# Patient Record
Sex: Male | Born: 1965
Health system: Southern US, Community
[De-identification: ages and names within clinical notes are randomized; demographics above are authoritative.]

## PROBLEM LIST (undated history)

## (undated) DIAGNOSIS — D496 Neoplasm of unspecified behavior of brain: Secondary | ICD-10-CM

## (undated) DIAGNOSIS — I10 Essential (primary) hypertension: Secondary | ICD-10-CM

## (undated) DIAGNOSIS — I639 Cerebral infarction, unspecified: Secondary | ICD-10-CM

## (undated) DIAGNOSIS — E119 Type 2 diabetes mellitus without complications: Secondary | ICD-10-CM

## (undated) HISTORY — PX: CRANIOTOMY: SHX93

## (undated) HISTORY — PX: BRAIN SURGERY: SHX531

---

## 2012-09-15 DIAGNOSIS — E119 Type 2 diabetes mellitus without complications: Secondary | ICD-10-CM

## 2012-09-15 HISTORY — DX: Type 2 diabetes mellitus without complications: E11.9

## 2015-04-22 ENCOUNTER — Observation Stay (HOSPITAL_COMMUNITY): Payer: Medicaid Other

## 2015-04-22 ENCOUNTER — Encounter (HOSPITAL_BASED_OUTPATIENT_CLINIC_OR_DEPARTMENT_OTHER): Payer: Self-pay | Admitting: *Deleted

## 2015-04-22 ENCOUNTER — Inpatient Hospital Stay (HOSPITAL_BASED_OUTPATIENT_CLINIC_OR_DEPARTMENT_OTHER)
Admission: EM | Admit: 2015-04-22 | Discharge: 2015-04-26 | DRG: 065 | Disposition: A | Discharge status: Home Health | Payer: Medicaid Other | Attending: Internal Medicine | Admitting: Internal Medicine

## 2015-04-22 ENCOUNTER — Emergency Department (HOSPITAL_BASED_OUTPATIENT_CLINIC_OR_DEPARTMENT_OTHER): Payer: Medicaid Other

## 2015-04-22 DIAGNOSIS — Z6830 Body mass index (BMI) 30.0-30.9, adult: Secondary | ICD-10-CM

## 2015-04-22 DIAGNOSIS — IMO0002 Reserved for concepts with insufficient information to code with codable children: Secondary | ICD-10-CM | POA: Diagnosis present

## 2015-04-22 DIAGNOSIS — G8194 Hemiplegia, unspecified affecting left nondominant side: Secondary | ICD-10-CM | POA: Diagnosis present

## 2015-04-22 DIAGNOSIS — Z8673 Personal history of transient ischemic attack (TIA), and cerebral infarction without residual deficits: Secondary | ICD-10-CM | POA: Diagnosis present

## 2015-04-22 DIAGNOSIS — E1065 Type 1 diabetes mellitus with hyperglycemia: Secondary | ICD-10-CM

## 2015-04-22 DIAGNOSIS — E860 Dehydration: Secondary | ICD-10-CM | POA: Diagnosis present

## 2015-04-22 DIAGNOSIS — B964 Proteus (mirabilis) (morganii) as the cause of diseases classified elsewhere: Secondary | ICD-10-CM | POA: Diagnosis present

## 2015-04-22 DIAGNOSIS — G8190 Hemiplegia, unspecified affecting unspecified side: Secondary | ICD-10-CM

## 2015-04-22 DIAGNOSIS — N189 Chronic kidney disease, unspecified: Secondary | ICD-10-CM | POA: Diagnosis present

## 2015-04-22 DIAGNOSIS — I10 Essential (primary) hypertension: Secondary | ICD-10-CM | POA: Diagnosis present

## 2015-04-22 DIAGNOSIS — I63339 Cerebral infarction due to thrombosis of unspecified posterior cerebral artery: Secondary | ICD-10-CM

## 2015-04-22 DIAGNOSIS — G459 Transient cerebral ischemic attack, unspecified: Secondary | ICD-10-CM | POA: Diagnosis present

## 2015-04-22 DIAGNOSIS — E785 Hyperlipidemia, unspecified: Secondary | ICD-10-CM | POA: Diagnosis present

## 2015-04-22 DIAGNOSIS — E669 Obesity, unspecified: Secondary | ICD-10-CM | POA: Diagnosis present

## 2015-04-22 DIAGNOSIS — I69354 Hemiplegia and hemiparesis following cerebral infarction affecting left non-dominant side: Secondary | ICD-10-CM

## 2015-04-22 DIAGNOSIS — N390 Urinary tract infection, site not specified: Secondary | ICD-10-CM | POA: Diagnosis present

## 2015-04-22 DIAGNOSIS — I639 Cerebral infarction, unspecified: Principal | ICD-10-CM | POA: Diagnosis present

## 2015-04-22 DIAGNOSIS — N179 Acute kidney failure, unspecified: Secondary | ICD-10-CM

## 2015-04-22 DIAGNOSIS — M25512 Pain in left shoulder: Secondary | ICD-10-CM

## 2015-04-22 DIAGNOSIS — Z87898 Personal history of other specified conditions: Secondary | ICD-10-CM

## 2015-04-22 DIAGNOSIS — Z9114 Patient's other noncompliance with medication regimen: Secondary | ICD-10-CM | POA: Diagnosis present

## 2015-04-22 DIAGNOSIS — R471 Dysarthria and anarthria: Secondary | ICD-10-CM | POA: Diagnosis present

## 2015-04-22 DIAGNOSIS — Z9889 Other specified postprocedural states: Secondary | ICD-10-CM

## 2015-04-22 DIAGNOSIS — I129 Hypertensive chronic kidney disease with stage 1 through stage 4 chronic kidney disease, or unspecified chronic kidney disease: Secondary | ICD-10-CM | POA: Diagnosis present

## 2015-04-22 DIAGNOSIS — E1165 Type 2 diabetes mellitus with hyperglycemia: Secondary | ICD-10-CM | POA: Diagnosis present

## 2015-04-22 DIAGNOSIS — E1122 Type 2 diabetes mellitus with diabetic chronic kidney disease: Secondary | ICD-10-CM | POA: Diagnosis present

## 2015-04-22 DIAGNOSIS — E1029 Type 1 diabetes mellitus with other diabetic kidney complication: Secondary | ICD-10-CM | POA: Diagnosis present

## 2015-04-22 DIAGNOSIS — I5032 Chronic diastolic (congestive) heart failure: Secondary | ICD-10-CM | POA: Diagnosis present

## 2015-04-22 DIAGNOSIS — R739 Hyperglycemia, unspecified: Secondary | ICD-10-CM | POA: Diagnosis present

## 2015-04-22 HISTORY — DX: Type 2 diabetes mellitus without complications: E11.9

## 2015-04-22 HISTORY — DX: Cerebral infarction, unspecified: I63.9

## 2015-04-22 HISTORY — DX: Essential (primary) hypertension: I10

## 2015-04-22 HISTORY — DX: Neoplasm of unspecified behavior of brain: D49.6

## 2015-04-22 LAB — CBC
HCT: 38.6 % — ABNORMAL LOW (ref 39.0–52.0)
Hemoglobin: 12.9 g/dL — ABNORMAL LOW (ref 13.0–17.0)
MCH: 26.8 pg (ref 26.0–34.0)
MCHC: 33.4 g/dL (ref 30.0–36.0)
MCV: 80.2 fL (ref 78.0–100.0)
PLATELETS: 229 10*3/uL (ref 150–400)
RBC: 4.81 MIL/uL (ref 4.22–5.81)
RDW: 14.2 % (ref 11.5–15.5)
WBC: 14.3 10*3/uL — ABNORMAL HIGH (ref 4.0–10.5)

## 2015-04-22 LAB — BASIC METABOLIC PANEL
ANION GAP: 13 (ref 5–15)
BUN: 17 mg/dL (ref 6–20)
CO2: 24 mmol/L (ref 22–32)
Calcium: 9.3 mg/dL (ref 8.9–10.3)
Chloride: 97 mmol/L — ABNORMAL LOW (ref 101–111)
Creatinine, Ser: 1.64 mg/dL — ABNORMAL HIGH (ref 0.61–1.24)
GFR, EST AFRICAN AMERICAN: 56 mL/min — AB (ref >60)
GFR, EST NON AFRICAN AMERICAN: 48 mL/min — AB (ref >60)
Glucose, Bld: 513 mg/dL — ABNORMAL HIGH (ref 65–99)
Potassium: 3.9 mmol/L (ref 3.5–5.1)
Sodium: 134 mmol/L — ABNORMAL LOW (ref 135–145)

## 2015-04-22 LAB — URINALYSIS, ROUTINE W REFLEX MICROSCOPIC
BILIRUBIN URINE: NEGATIVE
Glucose, UA: >1000 mg/dL — AB
KETONES UR: NEGATIVE mg/dL
Nitrite: NEGATIVE
PH: 5.5 (ref 5.0–8.0)
Protein, ur: 30 mg/dL — AB
Specific Gravity, Urine: 1.026 (ref 1.005–1.030)
Urobilinogen, UA: 1 mg/dL (ref 0.0–1.0)

## 2015-04-22 LAB — DIFFERENTIAL
Basophils Absolute: 0 10*3/uL (ref 0.0–0.1)
Basophils Relative: 0 % (ref 0–1)
Eosinophils Absolute: 0 10*3/uL (ref 0.0–0.7)
Eosinophils Relative: 0 % (ref 0–5)
LYMPHS PCT: 9 % — AB (ref 12–46)
Lymphs Abs: 1.2 10*3/uL (ref 0.7–4.0)
MONO ABS: 1 10*3/uL (ref 0.1–1.0)
Monocytes Relative: 7 % (ref 3–12)
NEUTROS ABS: 12.1 10*3/uL — AB (ref 1.7–7.7)
Neutrophils Relative %: 84 % — ABNORMAL HIGH (ref 43–77)

## 2015-04-22 LAB — APTT: aPTT: 28 seconds (ref 24–37)

## 2015-04-22 LAB — URINE MICROSCOPIC-ADD ON

## 2015-04-22 LAB — GLUCOSE, CAPILLARY: Glucose-Capillary: 277 mg/dL — ABNORMAL HIGH (ref 65–99)

## 2015-04-22 LAB — RAPID URINE DRUG SCREEN, HOSP PERFORMED
AMPHETAMINES: NOT DETECTED
Barbiturates: NOT DETECTED
Benzodiazepines: NOT DETECTED
Cocaine: NOT DETECTED
Opiates: NOT DETECTED
TETRAHYDROCANNABINOL: NOT DETECTED

## 2015-04-22 LAB — PROTIME-INR
INR: 1.14 (ref 0.00–1.49)
PROTHROMBIN TIME: 14.8 s (ref 11.6–15.2)

## 2015-04-22 LAB — ETHANOL: Alcohol, Ethyl (B): <5 mg/dL (ref <5)

## 2015-04-22 LAB — CBG MONITORING, ED
GLUCOSE-CAPILLARY: 477 mg/dL — AB (ref 65–99)
GLUCOSE-CAPILLARY: 338 mg/dL — AB (ref 65–99)

## 2015-04-22 LAB — TROPONIN I: Troponin I: <0.03 ng/mL (ref <0.031)

## 2015-04-22 MED ORDER — SENNOSIDES-DOCUSATE SODIUM 8.6-50 MG PO TABS
1.0000 | ORAL_TABLET | Freq: Every evening | ORAL | Status: DC | PRN
  Administered 2015-04-25: 1 via ORAL
  Filled 2015-04-22: qty 1

## 2015-04-22 MED ORDER — INSULIN ASPART 100 UNIT/ML ~~LOC~~ SOLN
0.0000 [IU] | Freq: Every day | SUBCUTANEOUS | Status: DC
  Administered 2015-04-22: 3 [IU] via SUBCUTANEOUS

## 2015-04-22 MED ORDER — ASPIRIN 325 MG PO TABS
325.0000 mg | ORAL_TABLET | Freq: Every day | ORAL | Status: DC
  Administered 2015-04-23 (×2): 325 mg via ORAL
  Filled 2015-04-22 (×2): qty 1

## 2015-04-22 MED ORDER — SODIUM CHLORIDE 0.9 % IV BOLUS (SEPSIS)
1000.0000 mL | Freq: Once | INTRAVENOUS | Status: AC
  Administered 2015-04-22: 1000 mL via INTRAVENOUS

## 2015-04-22 MED ORDER — INSULIN ASPART 100 UNIT/ML ~~LOC~~ SOLN
0.0000 [IU] | Freq: Three times a day (TID) | SUBCUTANEOUS | Status: DC
  Administered 2015-04-23: 5 [IU] via SUBCUTANEOUS
  Administered 2015-04-23: 7 [IU] via SUBCUTANEOUS
  Administered 2015-04-23: 3 [IU] via SUBCUTANEOUS

## 2015-04-22 MED ORDER — ASPIRIN 81 MG PO CHEW
324.0000 mg | CHEWABLE_TABLET | Freq: Once | ORAL | Status: DC
  Filled 2015-04-22: qty 4

## 2015-04-22 MED ORDER — INSULIN ASPART 100 UNIT/ML IV SOLN
10.0000 [IU] | Freq: Once | INTRAVENOUS | Status: AC
  Administered 2015-04-22: 10 [IU] via INTRAVENOUS
  Filled 2015-04-22: qty 1

## 2015-04-22 MED ORDER — ENOXAPARIN SODIUM 40 MG/0.4ML ~~LOC~~ SOLN
40.0000 mg | Freq: Every day | SUBCUTANEOUS | Status: DC
  Administered 2015-04-23 – 2015-04-25 (×4): 40 mg via SUBCUTANEOUS
  Filled 2015-04-22 (×4): qty 0.4

## 2015-04-22 MED ORDER — DEXTROSE 5 % IV SOLN
1.0000 g | INTRAVENOUS | Status: DC
  Administered 2015-04-23 – 2015-04-24 (×2): 1 g via INTRAVENOUS
  Filled 2015-04-22 (×3): qty 10

## 2015-04-22 MED ORDER — ASPIRIN 81 MG PO CHEW
324.0000 mg | CHEWABLE_TABLET | Freq: Once | ORAL | Status: AC
  Administered 2015-04-22: 324 mg via ORAL
  Filled 2015-04-22: qty 4

## 2015-04-22 MED ORDER — ATORVASTATIN CALCIUM 10 MG PO TABS
10.0000 mg | ORAL_TABLET | Freq: Every day | ORAL | Status: DC
  Administered 2015-04-23 – 2015-04-25 (×4): 10 mg via ORAL
  Filled 2015-04-22 (×4): qty 1

## 2015-04-22 MED ORDER — SODIUM CHLORIDE 0.9 % IV SOLN
Freq: Once | INTRAVENOUS | Status: AC
  Administered 2015-04-22: 1000 mL via INTRAVENOUS

## 2015-04-22 MED ORDER — STROKE: EARLY STAGES OF RECOVERY BOOK
Freq: Once | Status: AC
  Administered 2015-04-23

## 2015-04-22 MED ORDER — CEFTRIAXONE SODIUM 1 G IJ SOLR
1.0000 g | Freq: Once | INTRAMUSCULAR | Status: AC
  Administered 2015-04-22: 1 g via INTRAVENOUS

## 2015-04-22 MED ORDER — CEFTRIAXONE SODIUM 1 G IJ SOLR
INTRAMUSCULAR | Status: AC
  Filled 2015-04-22: qty 10

## 2015-04-22 NOTE — H&P (Signed)
[facility] Admission History and Physical       Paul Hanson TIW:[id number] DOB: January 09, 1966 DOA: 04/22/2015  Referring physician: EDP PCP: No primary care provider on file.  Specialists:   Chief Complaint: Slurred Speech and Increased Left sided Weakness  HPI: Paul Hanson is a 49 y.o. 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  He denies having an headache or chest pain.  His sister noticed his speech and recommended that he go to the ED for evaluation.  He was evaluated at the [facility] ED and a CT scan of the head was performed and returned negative for acute findings .  He was found to have a UTI and hyperglycemia with an initial glucose level of 513.  A Urine Culture was sent, and he was placed on IV Rocephin,   He was transferred to [facility] for further evaluation.       Review of Systems: Constitutional: No Weight Loss, No Weight Gain, Night Sweats, Fevers, Chills, Dizziness, Light Headedness, Fatigue, or Generalized Weakness HEENT: No Headaches, Difficulty Swallowing,Tooth/Dental Problems,Sore Throat,  No Sneezing, Rhinitis, Ear Ache, Nasal Congestion, or Post Nasal Drip,  Cardio-vascular:  No Chest pain, Orthopnea, PND, Edema in Lower Extremities, Anasarca, Dizziness, Palpitations  Resp: No Dyspnea, No DOE, No Productive Cough, No Non-Productive Cough, No Hemoptysis, No Wheezing.    GI: No Heartburn, Indigestion, Abdominal Pain, Nausea, Vomiting, Diarrhea, Constipation, Hematemesis, Hematochezia, Melena, Change in Bowel Habits,  Loss of Appetite  GU: No Dysuria, No Change in Color of Urine, No Urgency or Urinary Frequency, No Flank pain.  Musculoskeletal: No Joint Pain or Swelling,  No Decreased Range of Motion, No Back Pain.  Neurologic: No Syncope, No Seizures, Muscle Weakness, Paresthesia, Vision Disturbance or Loss, No Diplopia, No Vertigo, No Difficulty Walking,  Skin: No Rash or Lesions. Psych: No Change in Mood or Affect, No Depression or Anxiety, No Memory loss, No Confusion, or Hallucinations   Past Medical History  Diagnosis Date  . Stroke   . Diabetes mellitus without complication   . Hypertension   . Brain tumor      Past Surgical History  Procedure Laterality Date  . Craniotomy        Prior to Admission medications   Not on File     No Known Allergies  Social History:  reports that he has never smoked. He does not have any smokeless tobacco history on file. He reports that he does not drink alcohol or use illicit drugs.    History reviewed. No pertinent family history.     Physical Exam:  GEN:  Pleasant Well Nourished and Well Developed  49 y.o. male examined and in no acute distress; cooperative with exam Filed Vitals:   04/22/15 1800 04/22/15 1930 04/22/15 2007 04/22/15 2053  BP: 116/69 120/65 114/70 124/74  Pulse: 93 84 81 93  Temp:    99.1 F (37.3 C)  TempSrc:    Oral  Resp: 20 21 24 20   Height:      Weight:      SpO2: 98% 100% 100% 100%   Blood pressure 124/74, pulse 93, temperature 99.1 F (37.3 C), temperature source Oral, resp. rate 20, height 5\' 8"  (1.727 m),  weight 90.719 kg (200 lb), SpO2 100 %. PSYCH: SHe is alert and oriented x4; does not appear anxious does not appear depressed; affect is normal HEENT: Normocephalic and Atraumatic, Mucous membranes pink; PERRLA; EOM intact; Fundi:  Benign;  No scleral icterus, Nares: Patent, Oropharynx: Clear,  Fair Dentition,    Neck:  FROM, No Cervical Lymphadenopathy nor Thyromegaly or Carotid Bruit; No JVD; Breasts:: Not examined CHEST WALL: No tenderness CHEST: Normal respiration, clear to auscultation bilaterally HEART: Regular rate and rhythm; no murmurs rubs or  gallops BACK: No kyphosis or scoliosis; No CVA tenderness ABDOMEN: Positive Bowel Sounds, Scaphoid, Obese, Soft Non-Tender, No Rebound or Guarding; No Masses, No Organomegaly, No Pannus; No Intertriginous candida. Rectal Exam: Not done EXTREMITIES: No Bone or Joint Deformity; Age-Appropriate Arthropathy of the Hands and knees; No Cyanosis, Clubbing, or Edema; No Ulcerations. Genitalia: not examined PULSES: 2+ and symmetric SKIN: Normal hydration no rash or ulceration  CNS:  Alert and Oriented x 4,   Mental Status:  Alert, Oriented, Thought Content Appropriate. Speech Fluent with evidence of Mild Dysarthria  ( Chronic). Able to follow 3 step commands without difficulty.  In No obvious pain.    Cranial Nerves:  II: Discs flat bilaterally; Visual fields Intact, Pupils equal and reactive.    III,IV, VI: Extra-ocular motions intact bilaterally    V,VII: smile symmetric, facial light touch sensation normal bilaterally    VIII: hearing intact bilaterally    IX,X: gag reflex present    XI: bilateral shoulder shrug    XII: midline tongue extension    Motor:  Right:  Upper extremity 5/5     Left:  Upper extremity 4/5     Right:  Lower extremity 5/5    Left:  Lower extremity 5-/5     Tone and Bulk:  normal tone throughout; no atrophy noted    Sensory:  Pinprick and light touch intact throughout, bilaterally    Deep Tendon Reflexes: 2+ and symmetric throughout    Plantars/ Babinski:  Right: normal Left: equivocal     Cerebellar:  Finger to nose with difficulty from Left Hand    Gait: deferred    Vascular: pulses palpable throughout    Labs on Admission:  Basic Metabolic Panel:  Recent Labs Lab 04/22/15 1640  NA 134*  K 3.9  CL 97*  CO2 24  GLUCOSE 513*  BUN 17  CREATININE 1.64*  CALCIUM 9.3   Liver Function Tests: No results for input(s): AST, ALT, ALKPHOS, BILITOT, PROT, ALBUMIN in the last 168 hours. No results for input(s): LIPASE, AMYLASE in the last 168 hours. No  results for input(s): AMMONIA in the last 168 hours. CBC:  Recent Labs Lab 04/22/15 1640  WBC 14.3*  NEUTROABS 12.1*  HGB 12.9*  HCT 38.6*  MCV 80.2  PLT 229   Cardiac Enzymes:  Recent Labs Lab 04/22/15 1640  TROPONINI <0.03    BNP (last 3 results) No results for input(s): BNP in the last 8760 hours.  ProBNP (last 3 results) No results for input(s): PROBNP in the last 8760 hours.  CBG:  Recent Labs Lab 04/22/15 1612 04/22/15 1812  GLUCAP 477* 338*    Radiological Exams on Admission: Ct Head Wo Contrast  04/22/2015   CLINICAL DATA:  Pt with left sided lower extremity weakness and unsteady gait, hx stroke, hx craniotomy for benign tumor 20+ yrs ago  EXAM: CT HEAD WITHOUT CONTRAST  TECHNIQUE: Contiguous axial images were obtained from the base of the skull through the vertex without  intravenous contrast.  COMPARISON:  None.  FINDINGS: The ventricles are normal in configuration. There is mild ventricular and sulcal enlargement reflecting volume loss mildly advanced for age. No hydrocephalus.  There are no parenchymal masses or mass effect. There is encephalomalacia in the left cerebellar hemisphere deep to a left craniectomy defect consistent with the history of resection of a benign tumor. There are calcifications along both cerebellar hemispheres, more prominent on the left. There is no evidence of a cortical infarct. Small old lacune infarct projects adjacent to the genu of the right internal capsule.  There are no extra-axial masses or abnormal fluid collections.  There is no intracranial hemorrhage.  There is expansion of the right posterior frontal and parietal bone with areas of lucency and sclerosis within the expanded diploic space. This has a chronic, benign appearance but is of unclear etiology. There is no upper pole in the right occipital bone superiorly.  Visualized sinuses and mastoid air cells are clear.  IMPRESSION: 1. No acute intracranial abnormalities. 2. Volume  loss advanced for age. 3. Status post left occipital craniectomy with underlying left cerebellar encephalomalacia. 4. Right posterior frontal/parietal bone expansion. This has a chronic/benign appearance. It could be from prior surgery. It is of unclear etiology, however.   Electronically Signed   By: Lajean Manes M.D.   On: 04/22/2015 17:10     EKG: Independently reviewed. Sinus tachycardia rate =107,  Nonspecific S-T changes   Assessment/Plan:   49 y.o. male with  Principal Problem:   1.    TIA (transient ischemic attack) vs CVA   TIA/CVA Workup   Cardiac Monitoring   Neuro Checks   MRI/MRA Brain ordered   Carotid US   2D ECHO   Check Fasting Lipids   Check HbA1C   ASA Rx   Added Atorvastatin 10 mg PO q day   Neurology Consulted- Dr Doy Mince      Active Problems:   2.    Hyperglycemia   SSI coverage PRN   Check HbA1C     3.    AKI (acute kidney injury)   IVFs   Monitor BUN/Cr      4.    UTI (urinary tract infection)   Urine C+S Sent   Empiric IV Rocephin        5.    Hypertension   Monitor BPs   PRN IV Hydralazine for SBP > 160       6.    History of brain tumor- S/P Craniotomy and Resection in 1991      7.    History of CVA (cerebrovascular accident) in 07/2013 with Chronic Left Sided Weakness      8.    H/O medication noncompliance- due to finances   Consult Case Management        9.    DVT Prophylaxis   Lovnox              Code Status:     FULL CODE     Family Communication:   Family at Bedside     Disposition Plan:    Observation Status        Time spent: 23 Minutes      Theressa Millard [facility] Pager [phone]   If [place] Please Contact the Day Rounding Team MD for [facility]  If 7PM-7AM, Please Contact Night-Floor Coverage  [email or web address] Password [email or web address] 04/22/2015, 10:27 PM     ADDENDUM:   Patient was seen and examined on 04/22/2015

## 2015-04-22 NOTE — ED Notes (Signed)
Pt states family said he had change in speech, alt LOC and weakness with onset approx 1430hrs today

## 2015-04-22 NOTE — ED Notes (Signed)
Placed on cont cardiac monitoring with cont POX

## 2015-04-22 NOTE — ED Notes (Signed)
MD at bedside. 

## 2015-04-22 NOTE — ED Notes (Signed)
12 lead EKG done and given to EDP for review

## 2015-04-22 NOTE — ED Notes (Signed)
Per PA okay for patient to drink.

## 2015-04-22 NOTE — ED Provider Notes (Signed)
CSN: [id number]     Arrival date & time 04/22/15  1608 History   First MD Initiated Contact with Patient 04/22/15 1612     Chief Complaint  Patient presents with  . Weakness     (Consider location/radiation/quality/duration/timing/severity/associated sxs/prior Treatment) HPI   Blood pressure 121/64, pulse 106, temperature 99 F (37.2 C), temperature source Oral, height 5\' 8"  (1.727 m), weight 200 lb (90.719 kg), SpO2 99 %.  Paul Hanson is a 49 y.o. male complaining of with past medical history significant for ischemic CVA in 2014 @ [facility], non-insulin-dependent diabetes, hypertension, brain tumor which was successfully removed in 1991. Patient is poor historian, the majority of the history supplied by his sister-in-law who is an Therapist, sports. She explains that the patient's sister was with the patient approximately 2:30 PM today, he was in the car and she had an episode of slurred speech states that he was "talking like a baby." She could not be understood this lasted approximately 5 minutes, he also had difficulty ambulating. There was a fall with no significant head trauma. He is not anticoagulated. Patient has history of prior CVA in 2014. He has a residual left-sided weakness. As per patient sister-in-law all symptoms are resolved, patient states that he is asymptomatic with no issues. He does not remember the incident.  Patient denies headache, change in vision, cervicalgia, chest pain, palpitations, weakness, shortness of breath, weakness above his baseline, difficulty ambulating. As per sister-in-law he lost his insurance and was noncompliant with all medications for 1 month, he recently started taking them 3-4 days ago.   Patient was following at [facility] however his insurance has lapsed and he's not been able to follow-up. Patient normally takes lisinopril hydrochlorothiazide, metformin, daily aspirin, and statin.   Past Medical History  Diagnosis Date  . Stroke   . Diabetes mellitus  without complication   . Hypertension   . Brain tumor    Past Surgical History  Procedure Laterality Date  . Craniotomy     History reviewed. No pertinent family history. History  Substance Use Topics  . Smoking status: Never Smoker   . Smokeless tobacco: Not on file  . Alcohol Use: No    Review of Systems  10 systems reviewed and found to be negative, except as noted in the HPI.  Allergies  Review of patient's allergies indicates no known allergies.  Home Medications   Prior to Admission medications   Not on File   BP 116/69 mmHg  Pulse 93  Temp(Src) 99 F (37.2 C) (Oral)  Resp 20  Ht 5\' 8"  (1.727 m)  Wt 200 lb (90.719 kg)  BMI 30.42 kg/m2  SpO2 98% Physical Exam  Constitutional: He is oriented to person, place, and time. He appears well-developed and well-nourished. No distress.  HENT:  Head: Normocephalic and atraumatic.  Mouth/Throat: Oropharynx is clear and moist.  Remote occipital craniotomy scar  Eyes: Conjunctivae and EOM are normal. Pupils are equal, round, and reactive to light.  Neck: Normal range of motion. No JVD present. No tracheal deviation present.  Cardiovascular: Normal rate, regular rhythm and intact distal pulses.   Mild tachycardia  Pulmonary/Chest: Effort normal and breath sounds normal. No stridor. No respiratory distress. He has no wheezes. He has no rales. He exhibits no tenderness.  Abdominal: Soft. He exhibits no distension and no mass. There is no tenderness. There is no rebound and no guarding.  Musculoskeletal: Normal range of motion. He exhibits no edema or tenderness.  Neurological: He is alert and oriented to person, place, and time.  Pupils equal round and reactive to light, there is no slurring, no facial asymmetry. Extraocular movement is intact, patient has 4 out of 5 strength on the left upper and lower extremities with normal sensation, no significant drift.  Skin: Skin is warm. He is not diaphoretic.  Psychiatric: He  has a normal mood and affect.  Nursing note and vitals reviewed.   ED Course  Procedures (including critical care time) Labs Review Labs Reviewed  BASIC METABOLIC PANEL - Abnormal; Notable for the following:    Sodium 134 (*)    Chloride 97 (*)    Glucose, Bld 513 (*)    Creatinine, Ser 1.64 (*)    GFR calc non Af Amer 48 (*)    GFR calc Af Amer 56 (*)    All other components within normal limits  CBC - Abnormal; Notable for the following:    WBC 14.3 (*)    Hemoglobin 12.9 (*)    HCT 38.6 (*)    All other components within normal limits  URINALYSIS, ROUTINE W REFLEX MICROSCOPIC (NOT AT [facility]) - Abnormal; Notable for the following:    APPearance CLOUDY (*)    Glucose, UA >1000 (*)    Hgb urine dipstick LARGE (*)    Protein, ur 30 (*)    Leukocytes, UA SMALL (*)    All other components within normal limits  DIFFERENTIAL - Abnormal; Notable for the following:    Neutrophils Relative % 84 (*)    Neutro Abs 12.1 (*)    Lymphocytes Relative 9 (*)    All other components within normal limits  URINE MICROSCOPIC-ADD ON - Abnormal; Notable for the following:    Bacteria, UA MANY (*)    All other components within normal limits  CBG MONITORING, ED - Abnormal; Notable for the following:    Glucose-Capillary 477 (*)    All other components within normal limits  CBG MONITORING, ED - Abnormal; Notable for the following:    Glucose-Capillary 338 (*)    All other components within normal limits  URINE CULTURE  ETHANOL  PROTIME-INR  APTT  URINE RAPID DRUG SCREEN, HOSP PERFORMED  TROPONIN I    Imaging Review Ct Head Wo Contrast  04/22/2015   CLINICAL DATA:  Pt with left sided lower extremity weakness and unsteady gait, hx stroke, hx craniotomy for benign tumor 20+ yrs ago  EXAM: CT HEAD WITHOUT CONTRAST  TECHNIQUE: Contiguous axial images were obtained from the base of the skull through the vertex without intravenous contrast.  COMPARISON:  None.  FINDINGS: The ventricles are normal  in configuration. There is mild ventricular and sulcal enlargement reflecting volume loss mildly advanced for age. No hydrocephalus.  There are no parenchymal masses or mass effect. There is encephalomalacia in the left cerebellar hemisphere deep to a left craniectomy defect consistent with the history of resection of a benign tumor. There are calcifications along both cerebellar hemispheres, more prominent on the left. There is no evidence of a cortical infarct. Small old lacune infarct projects adjacent to the genu of the right internal capsule.  There are no extra-axial masses or abnormal fluid collections.  There is no intracranial hemorrhage.  There is expansion of the right posterior frontal and parietal bone with areas of lucency and sclerosis within the expanded diploic space. This has a chronic, benign appearance but is of unclear etiology. There is no upper pole in the right occipital bone superiorly.  Visualized sinuses and mastoid air cells are clear.  IMPRESSION: 1. No acute intracranial abnormalities. 2. Volume loss advanced for age. 3. Status post left occipital craniectomy with underlying left cerebellar encephalomalacia. 4. Right posterior frontal/parietal bone expansion. This has a chronic/benign appearance. It could be from prior surgery. It is of unclear etiology, however.   Electronically Signed   By: Lajean Manes M.D.   On: 04/22/2015 17:10     EKG Interpretation   Date/Time:  Sunday April 22 2015 16:16:21 EDT Ventricular Rate:  107 PR Interval:  184 QRS Duration: 80 QT Interval:  306 QTC Calculation: 408 R Axis:   51 Text Interpretation:  Sinus tachycardia T wave abnormality, consider  anterior ischemia Abnormal ECG Confirmed by Alvino Chapel  MD, Ovid Curd [phone])  on 04/22/2015 4:18:54 PM      MDM   Final diagnoses:  AKI (acute kidney injury)  Hyperglycemia without ketosis  Transient cerebral ischemia, unspecified transient cerebral ischemia type  H/O medication  noncompliance  History of CVA (cerebrovascular accident)  Left hemiparesis  History of brain tumor    Filed Vitals:   04/22/15 1611 04/22/15 1613 04/22/15 1730 04/22/15 1800  BP: 121/64 121/64 113/81 116/69  Pulse: 110 106 96 93  Temp:  99 F (37.2 C)    TempSrc:  Oral    Resp:   15 20  Height:  5\' 8"  (1.727 m)    Weight:  200 lb (90.719 kg)    SpO2: 98% 99% 100% 98%    Medications  cefTRIAXone (ROCEPHIN) 1 g in dextrose 5 % 50 mL IVPB (1 g Intravenous New Bag/Given 04/22/15 1825)  cefTRIAXone (ROCEPHIN) 1 G injection (not administered)  sodium chloride 0.9 % bolus 1,000 mL (1,000 mLs Intravenous New Bag/Given 04/22/15 1713)  insulin aspart (novoLOG) injection 10 Units (10 Units Intravenous Given 04/22/15 1713)  aspirin chewable tablet 324 mg (324 mg Oral Given 04/22/15 1728)  0.9 %  sodium chloride infusion (1,000 mLs Intravenous New Bag/Given 04/22/15 1826)    Paul Hanson is a pleasant 49 y.o. male presenting with resolved episodes of 5 minutes of garbled speech with weakness and fall. Patient is poor historian, history supplied by his sister in law Paul Hanson who is a Therapist, sports. Patient has his baseline left-sided hemiparesis and is at his baseline now. Discuss case with Dr. Alvino Chapel at intake, agrees with not calling code stroke. EKG shows sinus tachycardia.  Patient's blood glucose is found to be elevated at 477, however he has a normal anion gap. Patient will be bolused 1 L and given 10 units of regular insulin.   CT negative, will give full dose aspirin. Trying to obtain records from [facility], however, medical records is closed and we are unable to obtain these records. His renal function is elevated at 1.64, baseline unknown.  As per sister in law, no h/o renal issues.   Urinalysis is suggestive of UTI. Patient does endorse urinary frequency and dysuria, urine culture is ordered and patient is given a gram of Rocephin.  Blood sugar is responding well to hydration and  insulin.  Case discussed with [facility] hospital[facility] Dr. Candiss Norse who accepts admission to a telemetry bed.    Monico Blitz, PA-C 04/22/15 1828  Davonna Belling, MD 04/23/15 0010

## 2015-04-23 ENCOUNTER — Observation Stay (HOSPITAL_COMMUNITY): Payer: Medicaid Other

## 2015-04-23 DIAGNOSIS — N189 Chronic kidney disease, unspecified: Secondary | ICD-10-CM | POA: Diagnosis present

## 2015-04-23 DIAGNOSIS — I69354 Hemiplegia and hemiparesis following cerebral infarction affecting left non-dominant side: Secondary | ICD-10-CM | POA: Diagnosis not present

## 2015-04-23 DIAGNOSIS — G459 Transient cerebral ischemic attack, unspecified: Secondary | ICD-10-CM

## 2015-04-23 DIAGNOSIS — I5032 Chronic diastolic (congestive) heart failure: Secondary | ICD-10-CM | POA: Diagnosis present

## 2015-04-23 DIAGNOSIS — E1029 Type 1 diabetes mellitus with other diabetic kidney complication: Secondary | ICD-10-CM | POA: Diagnosis present

## 2015-04-23 DIAGNOSIS — E1065 Type 1 diabetes mellitus with hyperglycemia: Secondary | ICD-10-CM

## 2015-04-23 DIAGNOSIS — I129 Hypertensive chronic kidney disease with stage 1 through stage 4 chronic kidney disease, or unspecified chronic kidney disease: Secondary | ICD-10-CM | POA: Diagnosis present

## 2015-04-23 DIAGNOSIS — Z9889 Other specified postprocedural states: Secondary | ICD-10-CM | POA: Diagnosis not present

## 2015-04-23 DIAGNOSIS — B964 Proteus (mirabilis) (morganii) as the cause of diseases classified elsewhere: Secondary | ICD-10-CM | POA: Diagnosis present

## 2015-04-23 DIAGNOSIS — Z8673 Personal history of transient ischemic attack (TIA), and cerebral infarction without residual deficits: Secondary | ICD-10-CM | POA: Diagnosis present

## 2015-04-23 DIAGNOSIS — IMO0002 Reserved for concepts with insufficient information to code with codable children: Secondary | ICD-10-CM | POA: Diagnosis present

## 2015-04-23 DIAGNOSIS — I639 Cerebral infarction, unspecified: Secondary | ICD-10-CM | POA: Diagnosis present

## 2015-04-23 DIAGNOSIS — N179 Acute kidney failure, unspecified: Secondary | ICD-10-CM | POA: Diagnosis present

## 2015-04-23 DIAGNOSIS — G8194 Hemiplegia, unspecified affecting left nondominant side: Secondary | ICD-10-CM | POA: Diagnosis present

## 2015-04-23 DIAGNOSIS — E860 Dehydration: Secondary | ICD-10-CM | POA: Diagnosis present

## 2015-04-23 DIAGNOSIS — R471 Dysarthria and anarthria: Secondary | ICD-10-CM | POA: Diagnosis present

## 2015-04-23 DIAGNOSIS — Z6830 Body mass index (BMI) 30.0-30.9, adult: Secondary | ICD-10-CM | POA: Diagnosis not present

## 2015-04-23 DIAGNOSIS — E785 Hyperlipidemia, unspecified: Secondary | ICD-10-CM | POA: Diagnosis present

## 2015-04-23 DIAGNOSIS — I6330 Cerebral infarction due to thrombosis of unspecified cerebral artery: Secondary | ICD-10-CM

## 2015-04-23 DIAGNOSIS — E1122 Type 2 diabetes mellitus with diabetic chronic kidney disease: Secondary | ICD-10-CM | POA: Diagnosis present

## 2015-04-23 DIAGNOSIS — Z9114 Patient's other noncompliance with medication regimen: Secondary | ICD-10-CM | POA: Diagnosis present

## 2015-04-23 DIAGNOSIS — N390 Urinary tract infection, site not specified: Secondary | ICD-10-CM | POA: Diagnosis present

## 2015-04-23 DIAGNOSIS — E1165 Type 2 diabetes mellitus with hyperglycemia: Secondary | ICD-10-CM | POA: Diagnosis present

## 2015-04-23 DIAGNOSIS — E669 Obesity, unspecified: Secondary | ICD-10-CM | POA: Diagnosis present

## 2015-04-23 LAB — LIPID PANEL
Cholesterol: 135 mg/dL (ref 0–200)
HDL: 36 mg/dL — ABNORMAL LOW (ref >40)
LDL CALC: 82 mg/dL (ref 0–99)
Total CHOL/HDL Ratio: 3.8 RATIO
Triglycerides: 86 mg/dL (ref <150)
VLDL: 17 mg/dL (ref 0–40)

## 2015-04-23 LAB — GLUCOSE, CAPILLARY
GLUCOSE-CAPILLARY: 303 mg/dL — AB (ref 65–99)
GLUCOSE-CAPILLARY: 278 mg/dL — AB (ref 65–99)
GLUCOSE-CAPILLARY: 275 mg/dL — AB (ref 65–99)
Glucose-Capillary: 246 mg/dL — ABNORMAL HIGH (ref 65–99)

## 2015-04-23 MED ORDER — ACETAMINOPHEN 325 MG PO TABS
650.0000 mg | ORAL_TABLET | Freq: Four times a day (QID) | ORAL | Status: DC | PRN
  Administered 2015-04-23 – 2015-04-24 (×2): 650 mg via ORAL
  Filled 2015-04-23 (×2): qty 2

## 2015-04-23 MED ORDER — CLOPIDOGREL BISULFATE 75 MG PO TABS
75.0000 mg | ORAL_TABLET | Freq: Every day | ORAL | Status: DC
  Administered 2015-04-23 – 2015-04-26 (×4): 75 mg via ORAL
  Filled 2015-04-23 (×4): qty 1

## 2015-04-23 MED ORDER — INSULIN GLARGINE 100 UNIT/ML ~~LOC~~ SOLN
10.0000 [IU] | Freq: Every day | SUBCUTANEOUS | Status: DC
  Administered 2015-04-23: 10 [IU] via SUBCUTANEOUS
  Filled 2015-04-23 (×2): qty 0.1

## 2015-04-23 MED ORDER — SODIUM CHLORIDE 0.9 % IV BOLUS (SEPSIS)
500.0000 mL | Freq: Once | INTRAVENOUS | Status: AC
  Administered 2015-04-23: 500 mL via INTRAVENOUS

## 2015-04-23 MED ORDER — INSULIN ASPART 100 UNIT/ML ~~LOC~~ SOLN
0.0000 [IU] | Freq: Three times a day (TID) | SUBCUTANEOUS | Status: DC
  Administered 2015-04-24 (×2): 7 [IU] via SUBCUTANEOUS
  Administered 2015-04-24: 11 [IU] via SUBCUTANEOUS
  Administered 2015-04-25: 7 [IU] via SUBCUTANEOUS
  Administered 2015-04-25: 4 [IU] via SUBCUTANEOUS
  Administered 2015-04-25 – 2015-04-26 (×3): 7 [IU] via SUBCUTANEOUS

## 2015-04-23 MED ORDER — INSULIN ASPART 100 UNIT/ML ~~LOC~~ SOLN
0.0000 [IU] | Freq: Every day | SUBCUTANEOUS | Status: DC
  Administered 2015-04-23 – 2015-04-24 (×2): 3 [IU] via SUBCUTANEOUS
  Administered 2015-04-25: 2 [IU] via SUBCUTANEOUS

## 2015-04-23 NOTE — Progress Notes (Signed)
Inpatient Diabetes Program Recommendations  AACE/ADA: New Consensus Statement on Inpatient Glycemic Control (2013)  Target Ranges:  Prepandial:   less than 140 mg/dL      Peak postprandial:   less than 180 mg/dL (1-2 hours)      Critically ill patients:  140 - 180 mg/dL   Results for Paul Hanson, Paul Hanson (MRN [id number]) as of 04/23/2015 10:13  Ref. Range 04/22/2015 16:12 04/22/2015 18:12 04/22/2015 22:29 04/23/2015 07:34  Glucose-Capillary Latest Ref Range: 65-99 mg/dL 477 (H) 338 (H) 277 (H) 246 (H)    Diabetes history:DM2  Outpatient Diabetes medications: None Current orders for Inpatient glycemic control: Novolog 0-9 units TID with meals, Novolog 0-5 units HS  Inpatient Diabetes Program Recommendations Insulin - Basal: Please consider ordering Lantus 10 units Q24H starting now. Correction (SSI): Please consider increasing Novolog correction to moderate scale. HgbA1C: A1C has been ordered and is pending.  Thanks, Barnie Alderman, RN, MSN, CCRN, CDE Diabetes Coordinator Inpatient Diabetes Program [phone] (Team Pager from [place] to [place]) [phone] ([place] office) [phone] [facility] office) [phone] [facility] office)

## 2015-04-23 NOTE — Progress Notes (Signed)
PROGRESS NOTE  Paul Hanson ZHG:[id number] DOB: [birth year] DOA: 04/22/2015 PCP: No primary care provider on file.  HPI/Recap of past 58 hours: 49 year old male past mental history of previous CVA causing left hemiparesis, medication noncompliance and hypertension admitted on 8/7 evening with slurred speech and left-sided weakness which resolved. On admission, patient also noted a UTI and significant hyperglycemia with glucose of 513. Patient has been noncompliant with his medications. CT scan initially negative, MRI did note suspected acute CVA although location not related to patient's symptoms.   Today, patient feeling better. No further slurred speech or new weakness. Neurology consulted  Assessment/Plan: Principal Problem:   Acute CVA/TIA (transient ischemic attack): Suspected from small vessel disease. Continue medications, awaiting A1c plus PT and OT eval.  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 Euvolemic. We'll provide education. Already on ACE inhibitor. Will add very low dose beta blocker  Acute kidney injury in the setting of Uncontrolled diabetes mellitus with hyperglycemia and with secondary renal disease: Suspect some chronic kidney disease, likely as a function of patient's poorly controlled diabetes. Given hyperglycemia, may be worse now. Continue to hydrate and recheck in the morning. Have started Lantus plus more aggressive sliding scale   Code Status: Full code  Family Communication: Multiple family members the bedside  Disposition Plan: Potential discharge  tomorrow   Consultants:  Neurology  Procedures:  Echocardiogram done 8/8: Grade 1 diastolic dysfunction  Carotid Dopplers done 8/8: No signs of significant stenoses  Antibiotics:  None   Objective: BP 103/71 mmHg  Pulse 84  Temp(Src) 99.1 F (37.3 C) (Oral)  Resp 16  Ht 5\' 8"  (1.727 m)  Wt 90.719 kg (200 lb)  BMI 30.42 kg/m2  SpO2 95%  Intake/Output Summary (Last 24 hours) at 04/23/15 1750 Last data filed at 04/23/15 1607  Gross per 24 hour  Intake      0 ml  Output    875 ml  Net   -875 ml   Filed Weights   04/22/15 1613  Weight: 90.719 kg (200 lb)    Exam:   General:  Alert and oriented 3, no acute distress  Cardiovascular: Regular rate and rhythm, S1-S2  Respiratory: Clear to auscultation bilaterally  Abdomen: Soft, nontender, nondistended, positive bowel sounds  Musculoskeletal: No clubbing or cyanosis or edema  Neuro: Cranial nerves II through XII intact, mild left-sided residual hemiparesis from old CVA   Data Reviewed: Basic Metabolic Panel:  Recent Labs Lab 04/22/15 1640  NA 134*  K 3.9  CL 97*  CO2 24  GLUCOSE 513*  BUN 17  CREATININE 1.64*  CALCIUM 9.3   Liver Function Tests: No results for input(s): AST, ALT, ALKPHOS, BILITOT, PROT, ALBUMIN in the last 168 hours. No results for input(s): LIPASE, AMYLASE in the last 168 hours. No results for input(s): AMMONIA in the last 168 hours. CBC:  Recent Labs Lab 04/22/15 1640  WBC 14.3*  NEUTROABS 12.1*  HGB 12.9*  HCT 38.6*  MCV 80.2  PLT 229   Cardiac Enzymes:    Recent Labs Lab 04/22/15 1640  TROPONINI <0.03   BNP (last 3  results) No results for input(s): BNP in the last 8760 hours.  ProBNP (last 3 results) No results for input(s): PROBNP in the last 8760 hours.  CBG:  Recent Labs Lab 04/22/15 1812 04/22/15 2229 04/23/15 0734 04/23/15 1204 04/23/15 1647  GLUCAP 338* 277* 246* 303* 278*    No results found for this or any previous visit (from the past  240 hour(s)).   Studies: Dg Chest 2 View  04/23/2015   CLINICAL DATA:  Transient ischemic attack  EXAM: CHEST  2 VIEW  COMPARISON:  None.  FINDINGS: Lungs are clear. Heart is upper normal in size with pulmonary vascularity within normal limits. No adenopathy. No bone lesions.  IMPRESSION: No edema or consolidation.   Electronically Signed   By: Lowella Grip III M.D.   On: 04/23/2015 02:52   [name] Contrast  04/23/2015   CLINICAL DATA:  5 minutes episode of slurred speech today. Fall without head trauma. Baseline LEFT hemi paresis. History of stroke, diabetes, hypertension, brain tumor.  EXAM: MRI HEAD WITHOUT CONTRAST  MRA HEAD WITHOUT CONTRAST  TECHNIQUE: Multiplanar, multiecho pulse sequences of the brain and surrounding structures were obtained without intravenous contrast. Angiographic images of the head were obtained using MRA technique without contrast.  COMPARISON:  CT head April 22, 2015 at 1659 hours.  FINDINGS: MRI HEAD FINDINGS  Subcentimeter focus of reduced diffusion LEFT occipital lobe on axial image 18/98 though, not identified on the coronal DWI . Numerous small foci of susceptibility artifact throughout the infra and supratentorial brain, general distribution. Ventricles and sulci are overall normal for patient's age. Patchy supratentorial and pontine white matter T2 hyperintensities. LEFT cerebellar encephalomalacia with faint susceptibility artifact consistent with prior surgery, overlying craniotomy. RIGHT globus pallidus probable lacunar infarct, less likely perivascular space given morphology. No midline shift. Mass effect on the RIGHT frontoparietal cerebrum due to expansile dove slowed pace base mass.  No abnormal extra-axial fluid collections. Ocular globes and orbital contents are unremarkable. Small bilateral maxillary mucosal retention cyst. Trace LEFT mastoid effusion. Generalized heterogeneous bone marrow signal. No abnormal sellar expansion. No cerebellar tonsillar  ectopia.  MRA HEAD FINDINGS  Anterior circulation: Normal flow related enhancement of the included cervical, petrous, cavernous and supra clinoid internal carotid arteries. Moderate stenosis RIGHT para clinoid internal carotid artery. Patent anterior communicating artery. Patent enhancement of the anterior and middle cerebral arteries, including more distal segments. Mild stenosis LEFT M1 segment.  No large vessel occlusion, high-grade stenosis, abnormal luminal irregularity, aneurysm.  Posterior circulation: LEFT vertebral artery is dominant. Basilar artery is patent, with flow related enhancement of the main branch vessels. Moderate stenosis proximal basilar artery, with poststenotic dilatation and, subsequent moderate stenosis mid basilar artery. Fetal origin LEFT posterior cerebral artery, occluded LEFT P2 segment proximally. Fenestrated RIGHT P1 segment, widely patent. Robust RIGHT posterior communicating artery.  No large vessel occlusion, high-grade stenosis, aneurysm. Moderate luminal irregularity of the vertebral arteries and basilar artery.  IMPRESSION: MRI HEAD: Subcentimeter probable acute LEFT occipital lobe infarct, less likely artifact related to mineralization.  Numerous small intraparenchymal foci of susceptibility artifact in a pattern suggesting chronic hypertension. Moderate white matter changes compatible with chronic small vessel ischemic disease.  Status post LEFT suboccipital craniectomy, LEFT cerebellar resection cavity.  Expansile RIGHT calvarium, this could reflect perinatal hematoma, possible vascular lesion without suspicious features though, would be better characterized on contrast-enhanced imaging with fat saturation as clinically indicated.  MRA HEAD: LEFT posterior cerebral artery (P2) occlusion.  Multifocal moderate basilar artery stenosis in a  background of moderate posterior circulation luminal irregularity likely representing atherosclerosis or possible post radiation changes,  recommend correlation with treatment history.   Electronically Signed   By: Elon Alas M.D.   On: 04/23/2015 00:47   Mr Jodene Nam Head/brain Wo Cm  04/23/2015   CLINICAL DATA:  5 minutes episode of slurred speech today. Fall without head trauma. Baseline LEFT hemi paresis. History of stroke, diabetes, hypertension, brain tumor.  EXAM: MRI HEAD WITHOUT CONTRAST  MRA HEAD WITHOUT CONTRAST  TECHNIQUE: Multiplanar, multiecho pulse sequences of the brain and surrounding structures were obtained without intravenous contrast. Angiographic images of the head were obtained using MRA technique without contrast.  COMPARISON:  CT head April 22, 2015 at 1659 hours.  FINDINGS: MRI HEAD FINDINGS  Subcentimeter focus of reduced diffusion LEFT occipital lobe on axial image 18/98 though, not identified on the coronal DWI . Numerous small foci of susceptibility artifact throughout the infra and supratentorial brain, general distribution. Ventricles and sulci are overall normal for patient's age. Patchy supratentorial and pontine white matter T2 hyperintensities. LEFT cerebellar encephalomalacia with faint susceptibility artifact consistent with prior surgery, overlying craniotomy. RIGHT globus pallidus probable lacunar infarct, less likely perivascular space given morphology. No midline shift. Mass effect on the RIGHT frontoparietal cerebrum due to expansile dove slowed pace base mass.  No abnormal extra-axial fluid collections. Ocular globes and orbital contents are unremarkable. Small bilateral maxillary mucosal retention cyst. Trace LEFT mastoid effusion. Generalized heterogeneous bone marrow signal. No abnormal sellar expansion. No cerebellar tonsillar ectopia.  MRA HEAD FINDINGS  Anterior circulation: Normal flow related enhancement of the included cervical, petrous, cavernous and supra clinoid internal carotid arteries. Moderate stenosis RIGHT para clinoid internal carotid artery. Patent anterior communicating artery. Patent  enhancement of the anterior and middle cerebral arteries, including more distal segments. Mild stenosis LEFT M1 segment.  No large vessel occlusion, high-grade stenosis, abnormal luminal irregularity, aneurysm.  Posterior circulation: LEFT vertebral artery is dominant. Basilar artery is patent, with flow related enhancement of the main branch vessels. Moderate stenosis proximal basilar artery, with poststenotic dilatation and, subsequent moderate stenosis mid basilar artery. Fetal origin LEFT posterior cerebral artery, occluded LEFT P2 segment proximally. Fenestrated RIGHT P1 segment, widely patent. Robust RIGHT posterior communicating artery.  No large vessel occlusion, high-grade stenosis, aneurysm. Moderate luminal irregularity of the vertebral arteries and basilar artery.  IMPRESSION: MRI HEAD: Subcentimeter probable acute LEFT occipital lobe infarct, less likely artifact related to mineralization.  Numerous small intraparenchymal foci of susceptibility artifact in a pattern suggesting chronic hypertension. Moderate white matter changes compatible with chronic small vessel ischemic disease.  Status post LEFT suboccipital craniectomy, LEFT cerebellar resection cavity.  Expansile RIGHT calvarium, this could reflect perinatal hematoma, possible vascular lesion without suspicious features though, would be better characterized on contrast-enhanced imaging with fat saturation as clinically indicated.  MRA HEAD: LEFT posterior cerebral artery (P2) occlusion.  Multifocal moderate basilar artery stenosis in a background of moderate posterior circulation luminal irregularity likely representing atherosclerosis or possible post radiation changes, recommend correlation with treatment history.   Electronically Signed   By: Elon Alas M.D.   On: 04/23/2015 00:47    Scheduled Meds: . atorvastatin  10 mg Oral q1800  . cefTRIAXone (ROCEPHIN)  IV  1 g Intravenous Q24H  . clopidogrel  75 mg Oral Daily  . enoxaparin  (LOVENOX) injection  40 mg Subcutaneous QHS  . [START ON 04/24/2015] insulin aspart  0-20 Units Subcutaneous TID WC  . insulin aspart  0-5 Units Subcutaneous QHS  .  insulin glargine  10 Units Subcutaneous QHS    Continuous Infusions:    Time spent: 25 minutes  [facility] Pager [phone]. If 7PM-7AM, please contact night-coverage at [email or web address], password [facility] 04/23/2015, 5:50 PM  LOS: 1 day

## 2015-04-23 NOTE — Progress Notes (Signed)
STROKE TEAM PROGRESS NOTE   HISTORY Paul Hanson is an 49 y.o. male who reports that he awakened this morning at baseline. Did not eat will this morning and went to church. Reportedly did not look well during church. Was unable to get something to eat until late this afternoon. Before eating had difficulty getting out of the car. Then was noted by his sister to be dragging his left leg. Per the patient he drags his left leg on a daily basis since his stroke in 2015. He did not feel it was worse but his sister was concerned. Patient was brought in for evaluation.   Date last known well: Date: 04/22/2015 Time last known well: Unable to determine tPA Given: No: Improvement in symptoms  He was admitted for further evaluation and treatment.   SUBJECTIVE (INTERVAL HISTORY) His family/friends are at the bedside.  Overall he feels his condition is stable.    OBJECTIVE Temp:  [98.5 F (36.9 C)-99.1 F (37.3 C)] 98.5 F (36.9 C) (08/08 0641) Pulse Rate:  [81-110] 81 (08/08 0641) Cardiac Rhythm:  [-] Normal sinus rhythm (08/08 0800) Resp:  [15-24] 18 (08/08 0641) BP: (87-124)/(44-81) 93/64 mmHg (08/08 0641) SpO2:  [93 %-100 %] 95 % (08/08 0641) Weight:  [90.719 kg (200 lb)] 90.719 kg (200 lb) (08/07 1613)   Recent Labs Lab 04/22/15 1612 04/22/15 1812 04/22/15 2229 04/23/15 0734  GLUCAP 477* 338* 277* 246*    Recent Labs Lab 04/22/15 1640  NA 134*  K 3.9  CL 97*  CO2 24  GLUCOSE 513*  BUN 17  CREATININE 1.64*  CALCIUM 9.3   No results for input(s): AST, ALT, ALKPHOS, BILITOT, PROT, ALBUMIN in the last 168 hours.  Recent Labs Lab 04/22/15 1640  WBC 14.3*  NEUTROABS 12.1*  HGB 12.9*  HCT 38.6*  MCV 80.2  PLT 229    Recent Labs Lab 04/22/15 1640  TROPONINI <0.03    Recent Labs  04/22/15 1640  LABPROT 14.8  INR 1.14    Recent Labs  04/22/15 1729  COLORURINE YELLOW  LABSPEC 1.026  PHURINE 5.5  GLUCOSEU >1000*  HGBUR LARGE*  BILIRUBINUR  NEGATIVE  KETONESUR NEGATIVE  PROTEINUR 30*  UROBILINOGEN 1.0  NITRITE NEGATIVE  LEUKOCYTESUR SMALL*       Component Value Date/Time   CHOL 135 04/23/2015 0816   TRIG 86 04/23/2015 0816   HDL 36* 04/23/2015 0816   CHOLHDL 3.8 04/23/2015 0816   VLDL 17 04/23/2015 0816   LDLCALC 82 04/23/2015 0816   No results found for: HGBA1C    Component Value Date/Time   LABOPIA NONE DETECTED 04/22/2015 1729   COCAINSCRNUR NONE DETECTED 04/22/2015 1729   LABBENZ NONE DETECTED 04/22/2015 1729   AMPHETMU NONE DETECTED 04/22/2015 1729   THCU NONE DETECTED 04/22/2015 1729   LABBARB NONE DETECTED 04/22/2015 1729     Recent Labs Lab 04/22/15 1640  ETH <5    Dg Chest 2 View 04/23/2015   No edema or consolidation.     Ct Head Wo Contrast 04/22/2015   1. No acute intracranial abnormalities. 2. Volume loss advanced for age. 3. Status post left occipital craniectomy with underlying left cerebellar encephalomalacia. 4. Right posterior frontal/parietal bone expansion. This has a chronic/benign appearance. It could be from prior surgery. It is of unclear etiology, however.     MRI HEAD 04/23/2015    Subcentimeter probable acute LEFT occipital lobe infarct, less likely artifact related to mineralization.  Numerous small intraparenchymal foci of susceptibility artifact in a pattern suggesting  chronic hypertension. Moderate white matter changes compatible with chronic small vessel ischemic disease.  Status post LEFT suboccipital craniectomy, LEFT cerebellar resection cavity.  Expansile RIGHT calvarium, this could reflect perinatal hematoma, possible vascular lesion without suspicious features though, would be better characterized on contrast-enhanced imaging with fat saturation as clinically indicated.    MRA HEAD 04/23/2015    LEFT posterior cerebral artery (P2) occlusion.  Multifocal moderate basilar artery stenosis in a background of moderate posterior circulation luminal irregularity likely representing  atherosclerosis or possible post radiation changes, recommend correlation with treatment history.     Carotid Doppler  Bilateral: 1-39% ICA stenosis. Vertebral artery flow is antegrade. Left vertebral demonstrates atypical waveform.   PHYSICAL EXAM Pleasant middle aged male not in distress. . Afebrile. Head is nontraumatic. Neck is supple without bruit.    Cardiac exam no murmur or gallop. Lungs are clear to auscultation. Distal pulses are well felt. Neurological Exam :  Awake alert oriented x 3 normal speech and language. Mild left lower face asymmetry. Tongue midline. No drift. Mild diminished fine finger movements on left. Orbits right over left upper extremity. Mild left grip and hip flexors weak.. Normal sensation . Normal coordination. ASSESSMENT/PLAN [name] is a 49 y.o. male with history of history of a CVA with residual left sided weakness, brain tumor S/P craniotomy and resection, DM2, and HTNpresenting with slurred speech and increased left sided weakness. He did not receive IV t-PA due to improvement in symptoms.    Dysarthria and L hemiparesis due to recrudescence of previous stroke symptoms in setting of dehydration.  Stay hydrated  Incidental tiny L occipital infarct in setting of previous embolic R brain infarct not associated with presenting symptoms, source unknown.   MRI  tiny  Occipital infarct  MRA  L P2 occlusion, BA stenosis (intervention not recommended)  Carotid Doppler  L VA atypical waveform  2D Echo  pending   LDL 82  HgbA1c pending  Lovenox 40 mg sq daily  for VTE prophylaxis Diet Carb Modified Fluid consistency:: Thin; Room service appropriate?: Yes  aspirin 325 mg orally every day prior to admission, now on aspirin 325 mg orally every day. Will change to plavix  Patient counseled to be compliant with his antithrombotic medications  Complete stroke workup.   Therapy recommendations:  pending   Disposition:  anticipate return home  (not working since 2014 stroke)  Essential Hypertension  Stable  Hyperlipidemia  Home meds:  atorvostatin 10, resumed in hospital  LDL 82, goal < 70  Continue statin at discharge  Diabetes type II  HgbA1c pending , goal < 7.0  Other Stroke Risk Factors  Obesity, Body mass index is 30.42 kg/(m^2).   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 Started on aspirin.  Other Active Problems  AKI  UTI  H/O medication noncompliance  Other Pertinent History  Brain tumor s/p crani & resection [facility] day # Rodey Ada for Pager information 04/23/2015 2:20 PM  I have personally examined this patient, reviewed notes, independently viewed imaging studies, participated in medical decision making and plan of care. I have made any additions or clarifications directly to the above note. Agree with note above. He  has presented with. Worsening of his recent left sided weakness.due   to suspected. dehydration and MRI shows asymptomatic tiny left occipital incidental infarct likely from small  vessel disease. He.  remains at risk for neurological worsening, recurrent stroke/TIAs and needs ongoing stroke evaluation and aggressive risk factor modification. Continue aspirin and Plavix given recent left vertebral artery occlusion in 2 months ago He will advise aggressive hydration. Discussed with patient and son and answered questions   Antony Contras, MD Medical Director [place] Pager: [phone] 04/23/2015 3:12 PM    To contact Stroke Continuity provider, please refer to [email or web address] After hours, contact General Neurology

## 2015-04-23 NOTE — Progress Notes (Addendum)
*  PRELIMINARY RESULTS* Vascular Ultrasound Carotid Duplex (Doppler) has been completed.  Preliminary findings: Bilateral:  1-39% ICA stenosis.  Vertebral artery flow is antegrade.   Left vertebral demonstrates atypical waveform.   Landry Mellow, RDMS, RVT  04/23/2015, 1:10 PM

## 2015-04-23 NOTE — Consult Note (Signed)
Referring Physician: Candiss Norse    Chief Complaint: Left foot weakness  HPI: Paul Hanson is an 49 y.o. male who reports that he awakened this morning at baseline.  Did not eat will this morning and went to church.  Reportedly did not look well during church.  Was unable to get something to eat until late this afternoon.  Before eating had difficulty getting out of the car.  Then was noted by his sister to be dragging his left leg.  Per the patient he drags his left leg on a daily basis since his stroke in 2015.  He did not feel it was worse but his sister was concerned.  Patient was brought in for evaluation.    Date last known well: Date: 04/22/2015 Time last known well: Unable to determine tPA Given: No: Improvement in symptoms  Past Medical History  Diagnosis Date  . Stroke   . Diabetes mellitus without complication   . Hypertension   . Brain tumor     Past Surgical History  Procedure Laterality Date  . Craniotomy      Family history: Mother with diabetes.     Social History:  reports that he has never smoked. He does not have any smokeless tobacco history on file. He reports that he does not drink alcohol or use illicit drugs.  Allergies: No Known Allergies  Medications:  I have reviewed the patient's current medications. Prior to Admission:  No prescriptions prior to admission   Scheduled: . aspirin  325 mg Oral Daily  . atorvastatin  10 mg Oral q1800  . cefTRIAXone (ROCEPHIN)  IV  1 g Intravenous Q24H  . enoxaparin (LOVENOX) injection  40 mg Subcutaneous QHS  . insulin aspart  0-5 Units Subcutaneous QHS  . insulin aspart  0-9 Units Subcutaneous TID WC    ROS: History obtained from the patient  General ROS: negative for - chills, fatigue, fever, night sweats, weight gain or weight loss Psychological ROS: negative for - behavioral disorder, hallucinations, memory difficulties, mood swings or suicidal ideation Ophthalmic ROS: negative for - blurry vision, double  vision, eye pain or loss of vision ENT ROS: negative for - epistaxis, nasal discharge, oral lesions, sore throat, tinnitus or vertigo Allergy and Immunology ROS: negative for - hives or itchy/watery eyes Hematological and Lymphatic ROS: negative for - bleeding problems, bruising or swollen lymph nodes Endocrine ROS: negative for - galactorrhea, hair pattern changes, polydipsia/polyuria or temperature intolerance Respiratory ROS: negative for - cough, hemoptysis, shortness of breath or wheezing Cardiovascular ROS: negative for - chest pain, dyspnea on exertion, edema or irregular heartbeat Gastrointestinal ROS: negative for - abdominal pain, diarrhea, hematemesis, nausea/vomiting or stool incontinence Genito-Urinary ROS: negative for - dysuria, hematuria, incontinence or urinary frequency/urgency Musculoskeletal ROS: negative for - joint swelling or muscular weakness Neurological ROS: as noted in HPI Dermatological ROS: negative for rash and skin lesion changes  Physical Examination: Blood pressure 116/76, pulse 86, temperature 98.7 F (37.1 C), temperature source Oral, resp. rate 20, height 5\' 8"  (1.727 m), weight 90.719 kg (200 lb), SpO2 100 %.  HEENT-  Normocephalic, no lesions, without obvious abnormality.  Normal external eye and conjunctiva.  Normal TM's bilaterally.  Normal auditory canals and external ears. Normal external nose, mucus membranes and septum.  Normal pharynx. Cardiovascular- S1, S2 normal, pulses palpable throughout   Lungs- chest clear, no wheezing, rales, normal symmetric air entry Abdomen- soft, non-tender; bowel sounds normal; no masses,  no organomegaly Extremities- no edema Lymph-no adenopathy palpable Musculoskeletal-no joint  tenderness, deformity or swelling Skin-warm and dry, no hyperpigmentation, vitiligo, or suspicious lesions  Neurological Examination Mental Status: Alert, oriented, thought content appropriate.  Speech fluent without evidence of aphasia.   Able to follow 3 step commands without difficulty. Cranial Nerves: II: Discs flat bilaterally; Visual fields grossly normal, pupils equal, round, reactive to light and accommodation III,IV, VI: ptosis not present, extra-ocular motions intact bilaterally V,VII: smile symmetric, facial light touch sensation normal bilaterally VIII: hearing normal bilaterally IX,X: gag reflex present XI: bilateral shoulder shrug XII: midline tongue extension Motor: Right : Upper extremity   5/5    Left:     Upper extremity   4+/5  Lower extremity   5/5     Lower extremity   4/5 Tone and bulk:normal tone throughout; no atrophy noted Sensory: Pinprick and light touch decreased in the left upper and lower extremity Deep Tendon Reflexes: 1+ on the right and 2+ on the left  Plantars: Right: mute   Left: upgoing Cerebellar: Normal finger-to-nose and normal heel-to-shin testing on the right.  Mild dysmetria noted on the left.   Gait: Not tested due to safety concerns    Laboratory Studies:  Basic Metabolic Panel:  Recent Labs Lab 04/22/15 1640  NA 134*  K 3.9  CL 97*  CO2 24  GLUCOSE 513*  BUN 17  CREATININE 1.64*  CALCIUM 9.3    Liver Function Tests: No results for input(s): AST, ALT, ALKPHOS, BILITOT, PROT, ALBUMIN in the last 168 hours. No results for input(s): LIPASE, AMYLASE in the last 168 hours. No results for input(s): AMMONIA in the last 168 hours.  CBC:  Recent Labs Lab 04/22/15 1640  WBC 14.3*  NEUTROABS 12.1*  HGB 12.9*  HCT 38.6*  MCV 80.2  PLT 229    Cardiac Enzymes:  Recent Labs Lab 04/22/15 1640  TROPONINI <0.03    BNP: Invalid input(s): POCBNP  CBG:  Recent Labs Lab 04/22/15 1612 04/22/15 1812 04/22/15 2229  GLUCAP 477* 338* 277*    Microbiology: No results found for this or any previous visit.  Coagulation Studies:  Recent Labs  04/22/15 1640  LABPROT 14.8  INR 1.14    Urinalysis:  Recent Labs Lab 04/22/15 1729  COLORURINE YELLOW   LABSPEC 1.026  PHURINE 5.5  GLUCOSEU >1000*  HGBUR LARGE*  BILIRUBINUR NEGATIVE  KETONESUR NEGATIVE  PROTEINUR 30*  UROBILINOGEN 1.0  NITRITE NEGATIVE  LEUKOCYTESUR SMALL*    Lipid Panel: No results found for: CHOL, TRIG, HDL, CHOLHDL, VLDL, LDLCALC  HgbA1C: No results found for: HGBA1C  Urine Drug Screen:     Component Value Date/Time   LABOPIA NONE DETECTED 04/22/2015 1729   COCAINSCRNUR NONE DETECTED 04/22/2015 1729   LABBENZ NONE DETECTED 04/22/2015 1729   AMPHETMU NONE DETECTED 04/22/2015 1729   THCU NONE DETECTED 04/22/2015 1729   LABBARB NONE DETECTED 04/22/2015 1729    Alcohol Level:  Recent Labs Lab 04/22/15 1640  ETH <5    Other results: EKG:  Sinus tachycardia t 107 bpm.  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  COMPARISON:  None.  FINDINGS: The ventricles are normal in configuration. There is mild ventricular and sulcal enlargement reflecting volume loss mildly advanced for age. No hydrocephalus.  There are no parenchymal masses or mass effect. There is encephalomalacia in the left cerebellar hemisphere deep to a left craniectomy defect consistent with the history of resection of a benign tumor. There are calcifications along both cerebellar hemispheres, more prominent on the left. There is no evidence of a cortical infarct. Small old lacune infarct projects adjacent to the genu of the right internal capsule.  There are no extra-axial masses or abnormal fluid collections.  There is no intracranial hemorrhage.  There is expansion of the right posterior frontal and parietal bone with areas of lucency and sclerosis within the expanded diploic space. This has a chronic, benign  appearance but is of unclear etiology. There is no upper pole in the right occipital bone superiorly.  Visualized sinuses and mastoid air cells are clear.  IMPRESSION: 1. No acute intracranial abnormalities. 2. Volume loss advanced for age. 3. Status post left occipital craniectomy with underlying left cerebellar encephalomalacia. 4. Right posterior frontal/parietal bone expansion. This has a chronic/benign appearance. It could be from prior surgery. It is of unclear etiology, however.   Electronically Signed   By: Lajean Manes M.D.   On: 04/22/2015 17:10   [name] Contrast  04/23/2015   CLINICAL DATA:  5 minutes episode of slurred speech today. Fall without head trauma. Baseline LEFT hemi paresis. History of stroke, diabetes, hypertension, brain tumor.  EXAM: MRI HEAD WITHOUT CONTRAST  MRA HEAD WITHOUT CONTRAST  TECHNIQUE: Multiplanar, multiecho pulse sequences of the brain and surrounding structures were obtained without intravenous contrast. Angiographic images of the head were obtained using MRA technique without contrast.  COMPARISON:  CT head April 22, 2015 at 1659 hours.  FINDINGS: MRI HEAD FINDINGS  Subcentimeter focus of reduced diffusion LEFT occipital lobe on axial image 18/98 though, not identified on the coronal DWI . Numerous small foci of susceptibility artifact throughout the infra and supratentorial brain, general distribution. Ventricles and sulci are overall normal for patient's age. Patchy supratentorial and pontine white matter T2 hyperintensities. LEFT cerebellar encephalomalacia with faint susceptibility artifact consistent with prior surgery, overlying craniotomy. RIGHT globus pallidus probable lacunar infarct, less likely perivascular space given morphology. No midline shift. Mass effect on the RIGHT frontoparietal cerebrum due to expansile dove slowed pace base mass.  No abnormal extra-axial fluid collections. Ocular globes and orbital contents are unremarkable. Small bilateral  maxillary mucosal retention cyst. Trace LEFT mastoid effusion. Generalized heterogeneous bone marrow signal. No abnormal sellar expansion. No cerebellar tonsillar ectopia.  MRA HEAD FINDINGS  Anterior circulation: Normal flow related enhancement of the included cervical, petrous, cavernous and supra clinoid internal carotid arteries. Moderate stenosis RIGHT para clinoid internal carotid artery. Patent anterior communicating artery. Patent enhancement of the anterior and middle cerebral arteries, including more distal segments. Mild stenosis LEFT M1 segment.  No large vessel occlusion, high-grade stenosis, abnormal luminal irregularity, aneurysm.  Posterior circulation: LEFT vertebral artery is dominant. Basilar artery is patent, with flow related enhancement of the main branch vessels. Moderate stenosis proximal basilar artery, with poststenotic dilatation and, subsequent moderate stenosis mid basilar artery. Fetal origin LEFT posterior cerebral artery, occluded LEFT P2 segment proximally. Fenestrated RIGHT P1 segment, widely patent. Robust RIGHT posterior communicating artery.  No large vessel occlusion, high-grade stenosis, aneurysm. Moderate luminal irregularity of the vertebral arteries and basilar artery.  IMPRESSION: MRI HEAD: Subcentimeter probable acute LEFT occipital lobe infarct, less likely artifact related to mineralization.  Numerous small intraparenchymal foci of susceptibility artifact in a pattern suggesting chronic hypertension. Moderate white matter changes compatible with  chronic small vessel ischemic disease.  Status post LEFT suboccipital craniectomy, LEFT cerebellar resection cavity.  Expansile RIGHT calvarium, this could reflect perinatal hematoma, possible vascular lesion without suspicious features though, would be better characterized on contrast-enhanced imaging with fat saturation as clinically indicated.  MRA HEAD: LEFT posterior cerebral artery (P2) occlusion.  Multifocal moderate  basilar artery stenosis in a background of moderate posterior circulation luminal irregularity likely representing atherosclerosis or possible post radiation changes, recommend correlation with treatment history.   Electronically Signed   By: Elon Alas M.D.   On: 04/23/2015 00:47   Mr Jodene Nam Head/brain Wo Cm  04/23/2015   CLINICAL DATA:  5 minutes episode of slurred speech today. Fall without head trauma. Baseline LEFT hemi paresis. History of stroke, diabetes, hypertension, brain tumor.  EXAM: MRI HEAD WITHOUT CONTRAST  MRA HEAD WITHOUT CONTRAST  TECHNIQUE: Multiplanar, multiecho pulse sequences of the brain and surrounding structures were obtained without intravenous contrast. Angiographic images of the head were obtained using MRA technique without contrast.  COMPARISON:  CT head April 22, 2015 at 1659 hours.  FINDINGS: MRI HEAD FINDINGS  Subcentimeter focus of reduced diffusion LEFT occipital lobe on axial image 18/98 though, not identified on the coronal DWI . Numerous small foci of susceptibility artifact throughout the infra and supratentorial brain, general distribution. Ventricles and sulci are overall normal for patient's age. Patchy supratentorial and pontine white matter T2 hyperintensities. LEFT cerebellar encephalomalacia with faint susceptibility artifact consistent with prior surgery, overlying craniotomy. RIGHT globus pallidus probable lacunar infarct, less likely perivascular space given morphology. No midline shift. Mass effect on the RIGHT frontoparietal cerebrum due to expansile dove slowed pace base mass.  No abnormal extra-axial fluid collections. Ocular globes and orbital contents are unremarkable. Small bilateral maxillary mucosal retention cyst. Trace LEFT mastoid effusion. Generalized heterogeneous bone marrow signal. No abnormal sellar expansion. No cerebellar tonsillar ectopia.  MRA HEAD FINDINGS  Anterior circulation: Normal flow related enhancement of the included cervical,  petrous, cavernous and supra clinoid internal carotid arteries. Moderate stenosis RIGHT para clinoid internal carotid artery. Patent anterior communicating artery. Patent enhancement of the anterior and middle cerebral arteries, including more distal segments. Mild stenosis LEFT M1 segment.  No large vessel occlusion, high-grade stenosis, abnormal luminal irregularity, aneurysm.  Posterior circulation: LEFT vertebral artery is dominant. Basilar artery is patent, with flow related enhancement of the main branch vessels. Moderate stenosis proximal basilar artery, with poststenotic dilatation and, subsequent moderate stenosis mid basilar artery. Fetal origin LEFT posterior cerebral artery, occluded LEFT P2 segment proximally. Fenestrated RIGHT P1 segment, widely patent. Robust RIGHT posterior communicating artery.  No large vessel occlusion, high-grade stenosis, aneurysm. Moderate luminal irregularity of the vertebral arteries and basilar artery.  IMPRESSION: MRI HEAD: Subcentimeter probable acute LEFT occipital lobe infarct, less likely artifact related to mineralization.  Numerous small intraparenchymal foci of susceptibility artifact in a pattern suggesting chronic hypertension. Moderate white matter changes compatible with chronic small vessel ischemic disease.  Status post LEFT suboccipital craniectomy, LEFT cerebellar resection cavity.  Expansile RIGHT calvarium, this could reflect perinatal hematoma, possible vascular lesion without suspicious features though, would be better characterized on contrast-enhanced imaging with fat saturation as clinically indicated.  MRA HEAD: LEFT posterior cerebral artery (P2) occlusion.  Multifocal moderate basilar artery stenosis in a background of moderate posterior circulation luminal irregularity likely representing atherosclerosis or possible post radiation changes, recommend correlation with treatment history.   Electronically Signed   By: Elon Alas M.D.   On:  04/23/2015 00:47  Assessment: 49 y.o. male presenting with questionable worsening of baseline left sided weakness.  Patient noncompliant with medications.  Blood sugars elevated.  MRI of the brain independently reviewed and shows a probable small acute left occipital infarct.  This would not explain the patient's presentation.  Further work up recommended.    Stroke Risk Factors - diabetes mellitus and hypertension  Plan: 1. HgbA1c, fasting lipid panel 2. PT consult, OT consult, Speech consult 3. Echocardiogram 4. Carotid dopplers 5. Prophylactic therapy-Restart ASA 6. NPO until RN stroke swallow screen 7. Telemetry monitoring 8. Frequent neuro checks   Alexis Goodell, MD [facility] [phone] 04/23/2015, 1:21 AM

## 2015-04-23 NOTE — Progress Notes (Signed)
Echocardiogram 2D Echocardiogram has been performed.  Paul Hanson 04/23/2015, 3:57 PM

## 2015-04-24 DIAGNOSIS — Z9119 Patient's noncompliance with other medical treatment and regimen: Secondary | ICD-10-CM

## 2015-04-24 DIAGNOSIS — I639 Cerebral infarction, unspecified: Principal | ICD-10-CM

## 2015-04-24 DIAGNOSIS — G8324 Monoplegia of upper limb affecting left nondominant side: Secondary | ICD-10-CM

## 2015-04-24 DIAGNOSIS — Z8669 Personal history of other diseases of the nervous system and sense organs: Secondary | ICD-10-CM

## 2015-04-24 LAB — CBC
HEMATOCRIT: 39.5 % (ref 39.0–52.0)
Hemoglobin: 13.3 g/dL (ref 13.0–17.0)
MCH: 27 pg (ref 26.0–34.0)
MCHC: 33.7 g/dL (ref 30.0–36.0)
MCV: 80.1 fL (ref 78.0–100.0)
Platelets: 231 10*3/uL (ref 150–400)
RBC: 4.93 MIL/uL (ref 4.22–5.81)
RDW: 14.1 % (ref 11.5–15.5)
WBC: 11.4 10*3/uL — AB (ref 4.0–10.5)

## 2015-04-24 LAB — GLUCOSE, CAPILLARY
GLUCOSE-CAPILLARY: 238 mg/dL — AB (ref 65–99)
GLUCOSE-CAPILLARY: 281 mg/dL — AB (ref 65–99)
Glucose-Capillary: 245 mg/dL — ABNORMAL HIGH (ref 65–99)
Glucose-Capillary: 264 mg/dL — ABNORMAL HIGH (ref 65–99)
Glucose-Capillary: 258 mg/dL — ABNORMAL HIGH (ref 65–99)

## 2015-04-24 LAB — BASIC METABOLIC PANEL
Anion gap: 10 (ref 5–15)
BUN: 10 mg/dL (ref 6–20)
CALCIUM: 9.6 mg/dL (ref 8.9–10.3)
CHLORIDE: 104 mmol/L (ref 101–111)
CO2: 25 mmol/L (ref 22–32)
CREATININE: 0.91 mg/dL (ref 0.61–1.24)
GFR calc Af Amer: >60 mL/min (ref >60)
Glucose, Bld: 307 mg/dL — ABNORMAL HIGH (ref 65–99)
Potassium: 3.7 mmol/L (ref 3.5–5.1)
Sodium: 139 mmol/L (ref 135–145)

## 2015-04-24 LAB — HEMOGLOBIN A1C
Hgb A1c MFr Bld: 13 % — ABNORMAL HIGH (ref 4.8–5.6)
MEAN PLASMA GLUCOSE: 326 mg/dL

## 2015-04-24 MED ORDER — CARVEDILOL 3.125 MG PO TABS
3.1250 mg | ORAL_TABLET | Freq: Two times a day (BID) | ORAL | Status: DC
  Administered 2015-04-24 – 2015-04-26 (×5): 3.125 mg via ORAL
  Filled 2015-04-24 (×5): qty 1

## 2015-04-24 MED ORDER — INSULIN GLARGINE 100 UNIT/ML ~~LOC~~ SOLN
12.0000 [IU] | Freq: Every day | SUBCUTANEOUS | Status: DC
  Administered 2015-04-24 – 2015-04-25 (×2): 12 [IU] via SUBCUTANEOUS
  Filled 2015-04-24 (×3): qty 0.12

## 2015-04-24 MED ORDER — INSULIN STARTER KIT- SYRINGES (ENGLISH)
1.0000 | Freq: Once | Status: DC
  Filled 2015-04-24: qty 1

## 2015-04-24 MED ORDER — LIVING WELL WITH DIABETES BOOK
Freq: Once | Status: AC
  Administered 2015-04-24: 16:00:00
  Filled 2015-04-24: qty 1

## 2015-04-24 NOTE — Progress Notes (Signed)
PROGRESS NOTE  Paul Hanson ZOX:[id number] DOB: 1966/03/11 DOA: 04/22/2015 PCP: No primary care provider on file.  HPI/Recap of past 89 hours: 49 year old male past mental history of previous CVA causing left hemiparesis, medication noncompliance and hypertension admitted on 8/7 evening with slurred speech and left-sided weakness which resolved. On admission, patient also noted a UTI and significant hyperglycemia with glucose of 513. Patient has been noncompliant with his medications. CT scan initially negative, MRI did note suspected acute CVA although location not related to patient's symptoms.   Workup felt to be secondary to small vessel disease, likely in the setting of very poor compliance and control of diabetes. IV Rocephin started for patient's UTI and preliminary urinary cultures growing out Proteus. Patient has spiked low-grade fevers overnight and this evening. He himself has no complaints and is feeling okay.  Assessment/Plan: Principal Problem:   Acute CVA/TIA (transient ischemic attack): Suspected from small vessel disease. Echocardiogram unrevealing other than incidentally noted chronic diastolic heart failure. Dopplers unrevealing. PT and OT recommending inpatient rehabilitation, in patient rehabilitation consult pending. A1c noting uncontrolled diabetes at 13 Continue medications.  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 Prelim cultures growing out Proteus, white count however improving. Continue Rocephin for now. If white count worsens, will extend antibiotics coverage    Chronic diastolic heart failure: Incidentally  noted on echocardiogram. Euvolemic. We'll provide education. Already on ACE inhibitor. Will add very low dose beta blocker  Acute kidney injury in the setting of Uncontrolled diabetes mellitus with hyperglycemia:  Following aggressive hydration, creatinine back to normal, this was likely the setting of acute on chronic hyperglycemia and secondary diuresis   Code Status: Full code  Family Communication: Multiple family members the bedside  Disposition Plan: If inpatient rehabilitation unable to take patient tomorrow, likely discharge home with home health   Consultants:  Neurology  Procedures:  Echocardiogram done 8/8: Grade 1 diastolic dysfunction  Carotid Dopplers done 8/8: No signs of significant stenoses  Antibiotics:  None   Objective: BP 124/74 mmHg  Pulse 88  Temp(Src) 100.4 F (38 C) (Oral)  Resp 20  Ht $R'5\' 8"'cE$  (1.727 m)  Wt 90.719 kg (200 lb)  BMI 30.42 kg/m2  SpO2 100%  Intake/Output Summary (Last 24 hours) at 04/24/15 1957 Last data filed at 04/23/15 2250  Gross per 24 hour  Intake      0 ml  Output    900 ml  Net   -900 ml   Filed Weights   04/22/15 1613  Weight: 90.719 kg (200 lb)    Exam: Unchanged from previous day  General:  Alert and oriented 3, no acute distress  Cardiovascular: Regular rate and rhythm, S1-S2  Respiratory: Clear to auscultation bilaterally  Abdomen: Soft, nontender, nondistended, positive bowel sounds  Musculoskeletal: No clubbing or cyanosis or edema  Neuro: Cranial nerves II through XII intact, mild left-sided residual hemiparesis from old CVA   Data Reviewed: Basic Metabolic Panel:  Recent Labs Lab 04/22/15 1640 04/24/15 0947  NA 134* 139  K 3.9 3.7  CL 97* 104  CO2 24 25  GLUCOSE 513* 307*  BUN 17 10  CREATININE 1.64* 0.91  CALCIUM 9.3 9.6  Liver Function Tests: No results for input(s): AST, ALT, ALKPHOS, BILITOT, PROT, ALBUMIN in the last 168 hours. No results for input(s): LIPASE, AMYLASE in the  last 168 hours. No results for input(s): AMMONIA in the last 168 hours. CBC:  Recent Labs Lab 04/22/15 1640 04/24/15 0947  WBC 14.3* 11.4*  NEUTROABS 12.1*  --   HGB 12.9* 13.3  HCT 38.6* 39.5  MCV 80.2 80.1  PLT 229 231   Cardiac Enzymes:    Recent Labs Lab 04/22/15 1640  TROPONINI <0.03   BNP (last 3 results) No results for input(s): BNP in the last 8760 hours.  ProBNP (last 3 results) No results for input(s): PROBNP in the last 8760 hours.  CBG:  Recent Labs Lab 04/23/15 1647 04/23/15 2141 04/24/15 0625 04/24/15 1125 04/24/15 1633  GLUCAP 278* 275* 245* 264* 238*    Recent Results (from the past 240 hour(s))  Urine culture     Status: None (Preliminary result)   Collection Time: 04/22/15  5:29 PM  Result Value Ref Range Status   Specimen Description URINE, CLEAN CATCH  Final   Special Requests NONE  Final   Culture   Final    70,000 COLONIES/ml PROTEUS MIRABILIS Performed at [facility]    Report Status PENDING  Incomplete  Culture, blood (routine x 2)     Status: None (Preliminary result)   Collection Time: 04/23/15 10:29 PM  Result Value Ref Range Status   Specimen Description BLOOD LEFT ANTECUBITAL  Final   Special Requests BOTTLES DRAWN AEROBIC AND ANAEROBIC 5CC EA  Final   Culture PENDING  Incomplete   Report Status PENDING  Incomplete  Culture, blood (routine x 2)     Status: None (Preliminary result)   Collection Time: 04/23/15 10:48 PM  Result Value Ref Range Status   Specimen Description BLOOD LEFT HAND  Final   Special Requests BOTTLES DRAWN AEROBIC AND ANAEROBIC 5CC EA  Final   Culture PENDING  Incomplete   Report Status PENDING  Incomplete     Studies: No results found.  Scheduled Meds: . atorvastatin  10 mg Oral q1800  . carvedilol  3.125 mg Oral BID WC  . cefTRIAXone (ROCEPHIN)  IV  1 g Intravenous Q24H  . clopidogrel  75 mg Oral Daily  . enoxaparin (LOVENOX) injection  40 mg Subcutaneous QHS  . insulin aspart  0-20  Units Subcutaneous TID WC  . insulin aspart  0-5 Units Subcutaneous QHS  . insulin glargine  12 Units Subcutaneous QHS  . insulin starter kit- syringes  1 kit Other Once    Continuous Infusions:    Time spent: 25 minutes  [facility] Pager [phone]. If 7PM-7AM, please contact night-coverage at [email or web address], password [email or web address] 04/24/2015, 7:57 PM  LOS: 2 days

## 2015-04-24 NOTE — Progress Notes (Signed)
STROKE TEAM PROGRESS NOTE   HISTORY Paul Hanson is an 49 y.o. male who reports that he awakened this morning at baseline. Did not eat will this morning and went to church. Reportedly did not look well during church. Was unable to get something to eat until late this afternoon. Before eating had difficulty getting out of the car. Then was noted by his sister to be dragging his left leg. Per the patient he drags his left leg on a daily basis since his stroke in 2015. He did not feel it was worse but his sister was concerned. Patient was brought in for evaluation.   Date last known well: Date: 04/22/2015 Time last known well: Unable to determine tPA Given: No: Improvement in symptoms  He was admitted for further evaluation and treatment.   SUBJECTIVE (INTERVAL HISTORY) No one is at the bedside.  Overall he feels his condition is stable.    OBJECTIVE Temp:  [98.2 F (36.8 C)-101.1 F (38.4 C)] 98.2 F (36.8 C) (08/09 1002) Pulse Rate:  [83-99] 83 (08/09 1002) Cardiac Rhythm:  [-] Normal sinus rhythm (08/08 2021) Resp:  [16-18] 18 (08/09 1002) BP: (98-134)/(44-74) 120/69 mmHg (08/09 1002) SpO2:  [94 %-100 %] 100 % (08/09 1002)   Recent Labs Lab 04/23/15 0734 04/23/15 1204 04/23/15 1647 04/23/15 2141 04/24/15 0625  GLUCAP 246* 303* 278* 275* 245*    Recent Labs Lab 04/22/15 1640  NA 134*  K 3.9  CL 97*  CO2 24  GLUCOSE 513*  BUN 17  CREATININE 1.64*  CALCIUM 9.3   No results for input(s): AST, ALT, ALKPHOS, BILITOT, PROT, ALBUMIN in the last 168 hours.  Recent Labs Lab 04/22/15 1640 04/24/15 0947  WBC 14.3* 11.4*  NEUTROABS 12.1*  --   HGB 12.9* 13.3  HCT 38.6* 39.5  MCV 80.2 80.1  PLT 229 231    Recent Labs Lab 04/22/15 1640  TROPONINI <0.03    Recent Labs  04/22/15 1640  LABPROT 14.8  INR 1.14    Recent Labs  04/22/15 1729  COLORURINE YELLOW  LABSPEC 1.026  PHURINE 5.5  GLUCOSEU >1000*  HGBUR LARGE*  BILIRUBINUR NEGATIVE   KETONESUR NEGATIVE  PROTEINUR 30*  UROBILINOGEN 1.0  NITRITE NEGATIVE  LEUKOCYTESUR SMALL*       Component Value Date/Time   CHOL 135 04/23/2015 0816   TRIG 86 04/23/2015 0816   HDL 36* 04/23/2015 0816   CHOLHDL 3.8 04/23/2015 0816   VLDL 17 04/23/2015 0816   LDLCALC 82 04/23/2015 0816   Lab Results  Component Value Date   HGBA1C 13.0* 04/23/2015      Component Value Date/Time   LABOPIA NONE DETECTED 04/22/2015 1729   COCAINSCRNUR NONE DETECTED 04/22/2015 1729   LABBENZ NONE DETECTED 04/22/2015 1729   AMPHETMU NONE DETECTED 04/22/2015 1729   THCU NONE DETECTED 04/22/2015 1729   LABBARB NONE DETECTED 04/22/2015 1729     Recent Labs Lab 04/22/15 1640  ETH <5    Dg Chest 2 View 04/23/2015   No edema or consolidation.     Ct Head Wo Contrast 04/22/2015   1. No acute intracranial abnormalities. 2. Volume loss advanced for age. 3. Status post left occipital craniectomy with underlying left cerebellar encephalomalacia. 4. Right posterior frontal/parietal bone expansion. This has a chronic/benign appearance. It could be from prior surgery. It is of unclear etiology, however.     MRI HEAD 04/23/2015    Subcentimeter probable acute LEFT occipital lobe infarct, less likely artifact related to mineralization.  Numerous small  intraparenchymal foci of susceptibility artifact in a pattern suggesting chronic hypertension. Moderate white matter changes compatible with chronic small vessel ischemic disease.  Status post LEFT suboccipital craniectomy, LEFT cerebellar resection cavity.  Expansile RIGHT calvarium, this could reflect perinatal hematoma, possible vascular lesion without suspicious features though, would be better characterized on contrast-enhanced imaging with fat saturation as clinically indicated.    MRA HEAD 04/23/2015    LEFT posterior cerebral artery (P2) occlusion.  Multifocal moderate basilar artery stenosis in a background of moderate posterior circulation luminal  irregularity likely representing atherosclerosis or possible post radiation changes, recommend correlation with treatment history.     Carotid Doppler  Bilateral: 1-39% ICA stenosis. Vertebral artery flow is antegrade. Left vertebral demonstrates atypical waveform.  2D Echocardiogram  Left ventricle: The cavity size was normal. Systolic function wasnormal. The estimated ejection fraction was in the range of 60%to 65%. Wall motion was normal; there were no regional wallmotion abnormalities. Doppler parameters are consistent with abnormal left ventricular relaxation (grade 1 diastolicdysfunction). Impressions: No cardiac source of emboli was indentified.   PHYSICAL EXAM Pleasant middle aged male not in distress. . Afebrile. Head is nontraumatic. Neck is supple without bruit.    Cardiac exam no murmur or gallop. Lungs are clear to auscultation. Distal pulses are well felt. Neurological Exam :  Awake alert oriented x 3 normal speech and language. Mild left lower face asymmetry. Tongue midline. No drift. Mild diminished fine finger movements on left. Orbits right over left upper extremity. Mild left grip and hip flexors weak.. Normal sensation . Normal coordination. ASSESSMENT/PLAN [name] is a 49 y.o. male with history of history of a CVA with residual left sided weakness, brain tumor S/P craniotomy and resection, DM2, and HTNpresenting with slurred speech and increased left sided weakness. He did not receive IV t-PA due to improvement in symptoms.    Dysarthria and L hemiparesis due to recrudescence of previous stroke symptoms in setting of dehydration.  Stay hydrated  Incidental tiny L occipital infarct in setting of previous embolic R brain infarct not associated with presenting symptoms, source unknown.   MRI  tiny  Occipital infarct  MRA  L P2 occlusion, BA stenosis (intervention not recommended)  Carotid Doppler  L VA atypical waveform  2D Echo  No source of  embolus   LDL 82  HgbA1c pending  Lovenox 40 mg sq daily  for VTE prophylaxis Diet Carb Modified Fluid consistency:: Thin; Room service appropriate?: Yes  aspirin 325 mg orally every day prior to admission, now on aspirin 325 mg orally every day. Will change to plavix  Patient counseled to be compliant with his antithrombotic medications  Complete stroke workup.   Therapy recommendations:  pending   Disposition:  anticipate return home (not working since 2014 stroke)  Essential Hypertension  Stable  Hyperlipidemia  Home meds:  atorvostatin 10, resumed in hospital  LDL 82, goal < 70  Continue statin at discharge  Diabetes type II, uncontrolled  HgbA1c 13.0, goal < 7.0  Patient counseled to be compliant with diet and medications for better glucose control  Other Stroke Risk Factors  Obesity, Body mass index is 30.42 kg/(m^2).   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 Started on aspirin.  Other Active Problems  AKI  UTI  H/O medication noncompliance  Other Pertinent History  Brain tumor s/p crani & resection 1991    NOTHING FURTHER TO  ADD FROM THE STROKE STANDPOINT  Patient has a 10-15% risk of having another stroke over the next year, the highest risk is within 2 weeks of the most recent stroke/TIA (risk of having a stroke following a stroke or TIA is the same).  Ongoing risk factor control by Primary Care Physician  Stroke Service will sign off. Please call should any needs arise.  Follow-up Stroke Clinic at [facility] with Dr. Antony Contras in 2 months, order placed.    Hospital day # [place] for Pager information 04/24/2015 10:47 AM  I have personally examined this patient, reviewed notes, independently viewed imaging studies, participated in medical decision making and plan of  care. I have made any additions or clarifications directly to the above note. Agree with note above.    Antony Contras, MD Medical Director [facility] Pager: [phone] 04/25/2015 11:07 AM   To contact Stroke Continuity provider, please refer to [email or web address] After hours, contact General Neurology

## 2015-04-24 NOTE — Progress Notes (Addendum)
Inpatient Diabetes Program Recommendations  AACE/ADA: New Consensus Statement on Inpatient Glycemic Control (2013)  Target Ranges:  Prepandial:   less than 140 mg/dL      Peak postprandial:   less than 180 mg/dL (1-2 hours)      Critically ill patients:  140 - 180 mg/dL   Results for Paul Hanson, Paul Hanson (MRN [id number]) as of 04/24/2015 13:45  Ref. Range 04/23/2015 12:04 04/23/2015 16:47 04/23/2015 21:41 04/24/2015 06:25 04/24/2015 11:25  Glucose-Capillary Latest Ref Range: 65-99 mg/dL 303 (H) 278 (H) 275 (H) 245 (H) 264 (H)   Diabetes history: Type 2 diabetes Outpatient Diabetes medications: No meds listed in medication reconciliation Current orders for Inpatient glycemic control:  Lantus 12 units q HS, Novolog resistant tid with meals and HS  Consider increasing Lantus to 20 units daily (approximately 0.2 units/kg).  Based on A1C, patient will likely need insulin at home after discharge.  Will talk with patient regarding A1C.    Thanks, Adah Perl, RN, BC-ADM Inpatient Diabetes Coordinator Pager [phone] (8a-5p)

## 2015-04-24 NOTE — Progress Notes (Signed)
Rehab Admissions Coordinator Note:  Patient was screened by Lillyian Heidt L for appropriateness for an Inpatient Acute Rehab Consult.  At this time, we are recommending Inpatient Rehab consult. I will contact Dr. [place] Pink for rehab consult order. Thanks.  Betheny Suchecki L 04/24/2015, 9:16 AM  I can be reached at [phone].

## 2015-04-24 NOTE — Progress Notes (Signed)
Pt family members have been noted in observation room taking off bed alarm to assist pt to the bathroom. Pt's girlfriend observed taking off alarm and when she was informed that a staff member was coming to assist pt, she continued to assist pt without assist. Pt is alert and oriented and made aware that due to safety concerns he should be  assisted by staff.  Pt, family, and girlfriend noncompliant with staff request.

## 2015-04-24 NOTE — Consult Note (Signed)
Physical Medicine and Rehabilitation Consult Reason for Consult: Left occipital infarct Referring Physician: [facility]   HPI: Paul Hanson is a 49 y.o. 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 Patient lives with a sister and brother he is alone during the day and used a cane inconsistently prior to admission. Presented 04/22/2015 with slurred speech and increased left-sided weakness as well as blood sugar 513. Initial cranial CT scan negative. MRI of the head showed a acute left occipital infarct as well as left cerebellar resection cavity from history of craniotomy. 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 Echocardiogram with ejection fraction 65% and grade 1 diastolic dysfunction. Carotid Dopplers with no ICA stenosis. Patient did not receive TPA. Neurology consulted placed on Plavix for CVA prophylaxis as well as subcutaneous Lovenox for DVT prophylaxis. Hemoglobin A1c of 13 maintained on insulin therapy. Urine culture 04/22/2015 70,000 colony Proteus placed on intravenous Rocephin. Physical therapy evaluation completed 04/24/2015 with recommendations of physical medicine rehabilitation consult.  Patient his brother and sister are in the room. He indicates that he feels he is back to his baseline and his sister agrees.  Review of Systems  Constitutional: Negative for fever and chills.  Eyes: Negative for blurred vision and double vision.  Respiratory: Negative for shortness of breath.   Cardiovascular: Positive for leg swelling. Negative for chest pain and palpitations.  Gastrointestinal: Positive for nausea and constipation.  Musculoskeletal:  Positive for myalgias.  Skin: Negative for rash.  Neurological: Positive for dizziness, speech change and weakness. Negative for seizures and headaches.   Past Medical History  Diagnosis Date  . Stroke   . Diabetes mellitus without complication   . Hypertension   . Brain tumor    Past Surgical History  Procedure Laterality Date  . Craniotomy     History reviewed. No pertinent family history. Social History:  reports that he has never smoked. He does not have any smokeless tobacco history on file. He reports that he does not drink alcohol or use illicit drugs. Allergies: No Known Allergies Medications Prior to Admission  Medication Sig Dispense Refill  . aspirin EC 81 MG tablet Take 81 mg by mouth daily.    Marland Kitchen atorvastatin (LIPITOR) 80 MG tablet Take 80 mg by mouth daily.    Marland Kitchen glipiZIDE (GLUCOTROL) 5 MG tablet Take 5 mg by mouth daily before breakfast.    . lisinopril (PRINIVIL,ZESTRIL) 2.5 MG tablet Take 2.5 mg by mouth daily.      Home: Home Living Family/patient expects to be discharged to:: Private residence Living Arrangements: Other relatives (sister and brother, alone during the day) Available Help at Discharge: Family, Available 24 hours/day Type of Home: House Home Access: Stairs to enter Technical brewer of Steps: 2 Entrance Stairs-Rails: Right, Left Home Layout: Two level Alternate Level Stairs-Number of Steps: 12 Alternate Level Stairs-Rails: Right Bathroom Shower/Tub: Walk-in shower Home Equipment: Civil engineer, contracting, Radio producer - quad Additional Comments: pt reports he is alone during the day  Functional History: Prior Function Level of Independence: Independent Comments: inconsistent use of cane, pt able to do simple meal prep Functional Status:  Mobility: Bed Mobility Overal bed mobility: Needs Assistance Bed Mobility: Supine to Sit Supine to sit: Min guard General bed  mobility comments: pt impulsively pulled self up with L UE and use of hand rail, labored  effort and unsafe  Transfers Overall transfer level: Needs assistance Equipment used: 1 person hand held assist Transfers: Sit to/from Stand Sit to Stand: Min assist, Mod assist General transfer comment: max v/c's for safety and use of hands, pt unsteady upon initial stand and reaching for things to hold onto with wide base of support requiring min/modA to maintain balance Ambulation/Gait Ambulation/Gait assistance: Min assist, Mod assist Ambulation Distance (Feet): 150 Feet Assistive device: Rolling walker (2 wheeled), None Gait Pattern/deviations: Step-through pattern, Trunk flexed, Narrow base of support, Ataxic General Gait Details: pt ambulated with unilateral HHA to/from bathroom however very unsteady and reaching for objects to hold on to. Pt given RW and much improved but remains to required minA for walker management and reqwuired max directional v/c's. pt con't to have R toe in gait and mild ataxia with walker but much more stable with RW than without Gait velocity: decreased Gait velocity interpretation: <1.8 ft/sec, indicative of risk for recurrent falls Stairs: Yes Stairs assistance: Min assist Stair Management: One rail Right, Step to pattern, Forwards Number of Stairs: 4 General stair comments: max directional v/c's for sequencing steps, minA to steady patient    ADL:    Cognition: Cognition Overall Cognitive Status: Impaired/Different from baseline Orientation Level: Oriented X4 Cognition Arousal/Alertness: Awake/alert Behavior During Therapy: WFL for tasks assessed/performed Overall Cognitive Status: Impaired/Different from baseline Area of Impairment: Attention, Memory, Safety/judgement, Awareness, Problem solving Current Attention Level: Sustained Memory: Decreased short-term memory (unable to call any objects from mini mental exam ) Safety/Judgement: Decreased awareness of safety, Decreased awareness of deficits Awareness: Emergent Problem Solving: Slow  processing, Difficulty sequencing, Requires verbal cues, Requires tactile cues General Comments: pt given mini mental exam and unable to recall any items. when given 1 clue pt able to recall each object. ie. a fruit, he said orange.  Blood pressure 120/69, pulse 83, temperature 98.2 F (36.8 C), temperature source Oral, resp. rate 18, height 5\' 8"  (1.727 m), weight 90.719 kg (200 lb), SpO2 100 %. Physical Exam  Constitutional: He appears well-developed.  HENT:  Mild left facial weakness  Eyes: EOM are normal.  Neck: Normal range of motion. Neck supple. No thyromegaly present.  Cardiovascular: Normal rate and regular rhythm.   Respiratory: Effort normal and breath sounds normal. No respiratory distress.  GI: Soft. Bowel sounds are normal. He exhibits no distension.  Neurological: He is alert.  Makes good eye contact with examiner. Very slight dysarthric speech. Follows simple commands. Oriented to person place as well as good awareness of deficits. Decreased fine motor skills on the left.  Skin: Skin is warm and dry.  Ambulates with supervision assistance, supervision with sit to stand transfers, modified independent with bed mobility Motor strength is 4/5 in the left deltoid, biceps, triceps, grip 5/5 needle right deltoid, biceps, triceps, grip Lower extremities 5/5 hip flexor and knee extensor ankle dorsiflexor plantar flexor Sensation equal to light touch bilateral upper and lower limbs No evidence of right homonomous hemianopsia Results for orders placed or performed during the hospital encounter of 04/22/15 (from the past 24 hour(s))  Glucose, capillary     Status: Abnormal   Collection Time: 04/23/15 12:04 PM  Result Value Ref Range   Glucose-Capillary 303 (H) 65 - 99 mg/dL   Comment 1 Notify RN    Comment 2 Document in Chart   Glucose, capillary     Status: Abnormal   Collection  Time: 04/23/15  4:47 PM  Result Value Ref Range   Glucose-Capillary 278 (H) 65 - 99 mg/dL   Comment  1 Notify RN    Comment 2 Document in Chart   Glucose, capillary     Status: Abnormal   Collection Time: 04/23/15  9:41 PM  Result Value Ref Range   Glucose-Capillary 275 (H) 65 - 99 mg/dL   Comment 1 Notify RN    Comment 2 Document in Chart   Culture, blood (routine x 2)     Status: None (Preliminary result)   Collection Time: 04/23/15 10:29 PM  Result Value Ref Range   Specimen Description BLOOD LEFT ANTECUBITAL    Special Requests BOTTLES DRAWN AEROBIC AND ANAEROBIC 5CC EA    Culture PENDING    Report Status PENDING   Culture, blood (routine x 2)     Status: None (Preliminary result)   Collection Time: 04/23/15 10:48 PM  Result Value Ref Range   Specimen Description BLOOD LEFT HAND    Special Requests BOTTLES DRAWN AEROBIC AND ANAEROBIC 5CC EA    Culture PENDING    Report Status PENDING   Glucose, capillary     Status: Abnormal   Collection Time: 04/24/15  6:25 AM  Result Value Ref Range   Glucose-Capillary 245 (H) 65 - 99 mg/dL   Comment 1 Notify RN    Comment 2 Document in Chart   CBC     Status: Abnormal   Collection Time: 04/24/15  9:47 AM  Result Value Ref Range   WBC 11.4 (H) 4.0 - 10.5 K/uL   RBC 4.93 4.22 - 5.81 MIL/uL   Hemoglobin 13.3 13.0 - 17.0 g/dL   HCT 39.5 39.0 - 52.0 %   MCV 80.1 78.0 - 100.0 fL   MCH 27.0 26.0 - 34.0 pg   MCHC 33.7 30.0 - 36.0 g/dL   RDW 14.1 11.5 - 15.5 %   Platelets 231 150 - 400 K/uL   Dg Chest 2 View  04/23/2015   CLINICAL DATA:  Transient ischemic attack  EXAM: CHEST  2 VIEW  COMPARISON:  None.  FINDINGS: Lungs are clear. Heart is upper normal in size with pulmonary vascularity within normal limits. No adenopathy. No bone lesions.  IMPRESSION: No edema or consolidation.   Electronically Signed   By: Lowella Grip III M.D.   On: 04/23/2015 02:52   Ct Head Wo Contrast  04/22/2015   CLINICAL DATA:  Pt with left sided lower extremity weakness and unsteady gait, hx stroke, hx craniotomy for benign tumor 20+ yrs ago  EXAM: CT HEAD  WITHOUT CONTRAST  TECHNIQUE: Contiguous axial images were obtained from the base of the skull through the vertex without intravenous contrast.  COMPARISON:  None.  FINDINGS: The ventricles are normal in configuration. There is mild ventricular and sulcal enlargement reflecting volume loss mildly advanced for age. No hydrocephalus.  There are no parenchymal masses or mass effect. There is encephalomalacia in the left cerebellar hemisphere deep to a left craniectomy defect consistent with the history of resection of a benign tumor. There are calcifications along both cerebellar hemispheres, more prominent on the left. There is no evidence of a cortical infarct. Small old lacune infarct projects adjacent to the genu of the right internal capsule.  There are no extra-axial masses or abnormal fluid collections.  There is no intracranial hemorrhage.  There is expansion of the right posterior frontal and parietal bone with areas of lucency and sclerosis within the expanded diploic space. This  has a chronic, benign appearance but is of unclear etiology. There is no upper pole in the right occipital bone superiorly.  Visualized sinuses and mastoid air cells are clear.  IMPRESSION: 1. No acute intracranial abnormalities. 2. Volume loss advanced for age. 3. Status post left occipital craniectomy with underlying left cerebellar encephalomalacia. 4. Right posterior frontal/parietal bone expansion. This has a chronic/benign appearance. It could be from prior surgery. It is of unclear etiology, however.   Electronically Signed   By: Lajean Manes M.D.   On: 04/22/2015 17:10   [name] Contrast  04/23/2015   CLINICAL DATA:  5 minutes episode of slurred speech today. Fall without head trauma. Baseline LEFT hemi paresis. History of stroke, diabetes, hypertension, brain tumor.  EXAM: MRI HEAD WITHOUT CONTRAST  MRA HEAD WITHOUT CONTRAST  TECHNIQUE: Multiplanar, multiecho pulse sequences of the brain and surrounding structures were  obtained without intravenous contrast. Angiographic images of the head were obtained using MRA technique without contrast.  COMPARISON:  CT head April 22, 2015 at 1659 hours.  FINDINGS: MRI HEAD FINDINGS  Subcentimeter focus of reduced diffusion LEFT occipital lobe on axial image 18/98 though, not identified on the coronal DWI . Numerous small foci of susceptibility artifact throughout the infra and supratentorial brain, general distribution. Ventricles and sulci are overall normal for patient's age. Patchy supratentorial and pontine white matter T2 hyperintensities. LEFT cerebellar encephalomalacia with faint susceptibility artifact consistent with prior surgery, overlying craniotomy. RIGHT globus pallidus probable lacunar infarct, less likely perivascular space given morphology. No midline shift. Mass effect on the RIGHT frontoparietal cerebrum due to expansile dove slowed pace base mass.  No abnormal extra-axial fluid collections. Ocular globes and orbital contents are unremarkable. Small bilateral maxillary mucosal retention cyst. Trace LEFT mastoid effusion. Generalized heterogeneous bone marrow signal. No abnormal sellar expansion. No cerebellar tonsillar ectopia.  MRA HEAD FINDINGS  Anterior circulation: Normal flow related enhancement of the included cervical, petrous, cavernous and supra clinoid internal carotid arteries. Moderate stenosis RIGHT para clinoid internal carotid artery. Patent anterior communicating artery. Patent enhancement of the anterior and middle cerebral arteries, including more distal segments. Mild stenosis LEFT M1 segment.  No large vessel occlusion, high-grade stenosis, abnormal luminal irregularity, aneurysm.  Posterior circulation: LEFT vertebral artery is dominant. Basilar artery is patent, with flow related enhancement of the main branch vessels. Moderate stenosis proximal basilar artery, with poststenotic dilatation and, subsequent moderate stenosis mid basilar artery. Fetal  origin LEFT posterior cerebral artery, occluded LEFT P2 segment proximally. Fenestrated RIGHT P1 segment, widely patent. Robust RIGHT posterior communicating artery.  No large vessel occlusion, high-grade stenosis, aneurysm. Moderate luminal irregularity of the vertebral arteries and basilar artery.  IMPRESSION: MRI HEAD: Subcentimeter probable acute LEFT occipital lobe infarct, less likely artifact related to mineralization.  Numerous small intraparenchymal foci of susceptibility artifact in a pattern suggesting chronic hypertension. Moderate white matter changes compatible with chronic small vessel ischemic disease.  Status post LEFT suboccipital craniectomy, LEFT cerebellar resection cavity.  Expansile RIGHT calvarium, this could reflect perinatal hematoma, possible vascular lesion without suspicious features though, would be better characterized on contrast-enhanced imaging with fat saturation as clinically indicated.  MRA HEAD: LEFT posterior cerebral artery (P2) occlusion.  Multifocal moderate basilar artery stenosis in a background of moderate posterior circulation luminal irregularity likely representing atherosclerosis or possible post radiation changes, recommend correlation with treatment history.   Electronically Signed   By: Elon Alas M.D.   On: 04/23/2015 00:47   Mr Jodene Nam Head/brain Wo Cm  04/23/2015  CLINICAL DATA:  5 minutes episode of slurred speech today. Fall without head trauma. Baseline LEFT hemi paresis. History of stroke, diabetes, hypertension, brain tumor.  EXAM: MRI HEAD WITHOUT CONTRAST  MRA HEAD WITHOUT CONTRAST  TECHNIQUE: Multiplanar, multiecho pulse sequences of the brain and surrounding structures were obtained without intravenous contrast. Angiographic images of the head were obtained using MRA technique without contrast.  COMPARISON:  CT head April 22, 2015 at 1659 hours.  FINDINGS: MRI HEAD FINDINGS  Subcentimeter focus of reduced diffusion LEFT occipital lobe on axial  image 18/98 though, not identified on the coronal DWI . Numerous small foci of susceptibility artifact throughout the infra and supratentorial brain, general distribution. Ventricles and sulci are overall normal for patient's age. Patchy supratentorial and pontine white matter T2 hyperintensities. LEFT cerebellar encephalomalacia with faint susceptibility artifact consistent with prior surgery, overlying craniotomy. RIGHT globus pallidus probable lacunar infarct, less likely perivascular space given morphology. No midline shift. Mass effect on the RIGHT frontoparietal cerebrum due to expansile dove slowed pace base mass.  No abnormal extra-axial fluid collections. Ocular globes and orbital contents are unremarkable. Small bilateral maxillary mucosal retention cyst. Trace LEFT mastoid effusion. Generalized heterogeneous bone marrow signal. No abnormal sellar expansion. No cerebellar tonsillar ectopia.  MRA HEAD FINDINGS  Anterior circulation: Normal flow related enhancement of the included cervical, petrous, cavernous and supra clinoid internal carotid arteries. Moderate stenosis RIGHT para clinoid internal carotid artery. Patent anterior communicating artery. Patent enhancement of the anterior and middle cerebral arteries, including more distal segments. Mild stenosis LEFT M1 segment.  No large vessel occlusion, high-grade stenosis, abnormal luminal irregularity, aneurysm.  Posterior circulation: LEFT vertebral artery is dominant. Basilar artery is patent, with flow related enhancement of the main branch vessels. Moderate stenosis proximal basilar artery, with poststenotic dilatation and, subsequent moderate stenosis mid basilar artery. Fetal origin LEFT posterior cerebral artery, occluded LEFT P2 segment proximally. Fenestrated RIGHT P1 segment, widely patent. Robust RIGHT posterior communicating artery.  No large vessel occlusion, high-grade stenosis, aneurysm. Moderate luminal irregularity of the vertebral  arteries and basilar artery.  IMPRESSION: MRI HEAD: Subcentimeter probable acute LEFT occipital lobe infarct, less likely artifact related to mineralization.  Numerous small intraparenchymal foci of susceptibility artifact in a pattern suggesting chronic hypertension. Moderate white matter changes compatible with chronic small vessel ischemic disease.  Status post LEFT suboccipital craniectomy, LEFT cerebellar resection cavity.  Expansile RIGHT calvarium, this could reflect perinatal hematoma, possible vascular lesion without suspicious features though, would be better characterized on contrast-enhanced imaging with fat saturation as clinically indicated.  MRA HEAD: LEFT posterior cerebral artery (P2) occlusion.  Multifocal moderate basilar artery stenosis in a background of moderate posterior circulation luminal irregularity likely representing atherosclerosis or possible post radiation changes, recommend correlation with treatment history.   Electronically Signed   By: Elon Alas M.D.   On: 04/23/2015 00:47    Assessment/Plan: Diagnosis: Chronic left upper extremity weakness, most likely related to old subcortical lacunar infarct. No apparent deficit from his more recent Tiny left occipital lobe infarct 1. Does the need for close, 24 hr/day medical supervision in concert with the patient's rehab needs make it unreasonable for this patient to be served in a less intensive setting? No 2. Co-Morbidities requiring supervision/potential complications: History of left cerebellar mass 3. Due to safety, does the patient require 24 hr/day rehab nursing? No 4. Does the patient require coordinated care of a physician, rehab nurse, OT to address physical and functional deficits in the context of the above medical diagnosis(es)?  No Addressing deficits in the following areas: upper extremity strengthening 5. Can the patient actively participate in an intensive therapy program of at least 3 hrs of therapy per  day at least 5 days per week? N/A 6. The potential for patient to make measurable gains while on inpatient rehab is not applicable 7. Anticipated functional outcomes upon discharge from inpatient rehab are n/a  with PT, n/a with OT, n/a with SLP. 8. Estimated rehab length of stay to reach the above functional goals is: Not applicable 9. Does the patient have adequate social supports and living environment to accommodate these discharge functional goals? Yes 10. Anticipated D/C setting: Home 11. Anticipated post D/C treatments: HH therapy and OT only 12. Overall Rehab/Functional Prognosis: good  RECOMMENDATIONS: This patient's condition is appropriate for continued rehabilitative care in the following setting: [facility] Patient has agreed to participate in recommended program. N/A Note that insurance prior authorization may be required for reimbursement for recommended care.  Comment: 2 high level for CIR, Back to baseline essentially. Patient thinks perhaps his left upper extremity is a bit weaker than baseline, may benefit from some home health OT   04/24/2015

## 2015-04-24 NOTE — Evaluation (Signed)
Physical Therapy Evaluation Patient Details Name: Paul Hanson MRN: [id number] DOB: 1966/02/18 Today's Date: 04/24/2015   History of Present Illness  49 year old male past mental history of previous CVA causing left hemiparesis, medication noncompliance and hypertension admitted on 8/7 evening with slurred speech and left-sided weakness which resolved. On admission, patient also noted a UTI and significant hyperglycemia with glucose of 513. Pt MRI revealed small L occipital infarct  Clinical Impression  Pt admitted for above. Pt was functioning modified independent PTA and functioning at min/modA and requires use of RW and assist for safe ambulation. Pt also presents with vision and cognitive impairments as well. Pt demo's excellent rehab potential and believe he could achieve safe mod I level of function after CIR stay at d/c.    Follow Up Recommendations CIR    Equipment Recommendations  None recommended by PT    Recommendations for Other Services       Precautions / Restrictions Precautions Precautions: Fall Precaution Comments: poor memory, failed mini-mental exam Restrictions Weight Bearing Restrictions: No      Mobility  Bed Mobility Overal bed mobility: Needs Assistance Bed Mobility: Supine to Sit     Supine to sit: Min guard     General bed mobility comments: pt impulsively pulled self up with L UE and use of hand rail, labored effort and unsafe   Transfers Overall transfer level: Needs assistance Equipment used: 1 person hand held assist Transfers: Sit to/from Stand Sit to Stand: Min assist;Mod assist         General transfer comment: max v/c's for safety and use of hands, pt unsteady upon initial stand and reaching for things to hold onto with wide base of support requiring min/modA to maintain balance  Ambulation/Gait Ambulation/Gait assistance: Min assist;Mod assist Ambulation Distance (Feet): 150 Feet Assistive device: Rolling walker (2  wheeled);None Gait Pattern/deviations: Step-through pattern;Trunk flexed;Narrow base of support;Ataxic Gait velocity: decreased Gait velocity interpretation: <1.8 ft/sec, indicative of risk for recurrent falls General Gait Details: pt ambulated with unilateral HHA to/from bathroom however very unsteady and reaching for objects to hold on to. Pt given RW and much improved but remains to required minA for walker management and reqwuired max directional v/c's. pt con't to have R toe in gait and mild ataxia with walker but much more stable with RW than without  Stairs Stairs: Yes Stairs assistance: Min assist Stair Management: One rail Right;Step to pattern;Forwards Number of Stairs: 4 General stair comments: max directional v/c's for sequencing steps, minA to steady patient  Wheelchair Mobility    Modified Rankin (Stroke Patients Only) Modified Rankin (Stroke Patients Only) Pre-Morbid Rankin Score: Moderate disability Modified Rankin: Moderately severe disability     Balance Overall balance assessment: Needs assistance   Sitting balance-Leahy Scale: Fair     Standing balance support: No upper extremity supported Standing balance-Leahy Scale: Poor Standing balance comment: pt required minA to balance during urination in standing                             Pertinent Vitals/Pain Pain Assessment: No/denies pain    Home Living Family/patient expects to be discharged to:: Private residence Living Arrangements: Other relatives (sister and brother, alone during the day) Available Help at Discharge: Family;Available 24 hours/day Type of Home: House Home Access: Stairs to enter Entrance Stairs-Rails: Psychiatric nurse of Steps: 2 Home Layout: Two level Home Equipment: Shower seat;Cane - quad Additional Comments: pt reports he is alone during the  day    Prior Function Level of Independence: Independent         Comments: inconsistent use of cane, pt  able to do simple meal prep     Hand Dominance   Dominant Hand: Right    Extremity/Trunk Assessment   Upper Extremity Assessment: LUE deficits/detail;RUE deficits/detail RUE Deficits / Details: grossly 4/5     LUE Deficits / Details: shld flexion: 3-/5 due to pain, Elbow flexion 3/5, elbow ext: 3/5, grip 3-/5   Lower Extremity Assessment: RLE deficits/detail;LLE deficits/detail RLE Deficits / Details: grossly 4/5 LLE Deficits / Details: grossly 3-/5  Cervical / Trunk Assessment: Normal  Communication   Communication: No difficulties  Cognition Arousal/Alertness: Awake/alert Behavior During Therapy: WFL for tasks assessed/performed Overall Cognitive Status: Impaired/Different from baseline Area of Impairment: Attention;Memory;Safety/judgement;Awareness;Problem solving   Current Attention Level: Sustained Memory: Decreased short-term memory (unable to call any objects from mini mental exam )   Safety/Judgement: Decreased awareness of safety;Decreased awareness of deficits Awareness: Emergent Problem Solving: Slow processing;Difficulty sequencing;Requires verbal cues;Requires tactile cues General Comments: pt given mini mental exam and unable to recall any items. when given 1 clue pt able to recall each object. ie. a fruit, he said orange.    General Comments      Exercises        Assessment/Plan    PT Assessment Patient needs continued PT services  PT Diagnosis Difficulty walking;Generalized weakness;Hemiplegia non-dominant side   PT Problem List Decreased range of motion;Decreased strength;Decreased activity tolerance;Decreased balance;Decreased mobility  PT Treatment Interventions DME instruction;Gait training;Stair training;Functional mobility training;Therapeutic activities;Therapeutic exercise;Balance training;Neuromuscular re-education;Cognitive remediation;Patient/family education   PT Goals (Current goals can be found in the Care Plan section) Acute Rehab PT  Goals Patient Stated Goal: didn't state PT Goal Formulation: With patient/family Time For Goal Achievement: 05/01/15 Potential to Achieve Goals: Good    Frequency Min 3X/week   Barriers to discharge        Co-evaluation               End of Session Equipment Utilized During Treatment: Gait belt Activity Tolerance: Patient tolerated treatment well Patient left: in chair;with call bell/phone within reach;with chair alarm set;with family/visitor present Nurse Communication: Mobility status         Time: 0728-0802 PT Time Calculation (min) (ACUTE ONLY): 34 min   Charges:   PT Evaluation $Initial PT Evaluation Tier I: 1 Procedure PT Treatments $Gait Training: 8-22 mins   PT G CodesKingsley Callander 04/24/2015, 8:40 AM  Kittie Plater, PT, DPT Pager #: [phone] Office #: [phone]

## 2015-04-24 NOTE — Progress Notes (Signed)
Occupational Therapy Evaluation Patient Details Name: Paul Hanson MRN: [id number] DOB: March 26, 1966 Today's Date: 04/24/2015    History of Present Illness 49 year old male past mental history of previous CVA causing left hemiparesis, medication noncompliance and hypertension admitted on 8/7 evening with slurred speech and left-sided weakness which resolved. On admission, patient also noted a UTI and significant hyperglycemia with glucose of 513. Pt MRI revealed small L occipital infarct   Clinical Impression   Pt admitted with the above diagnoses and presents with below problem list. Pt will benefit from continued OT to address the below listed deficits and maximize independence with BADLs prior to d/c to venue below. PTA pt was independent with ADLs. Pt is currently min A for most ADLs. Of note, pt reports pain in left shoulder at about 80 degrees shoulder flexion/abduction and at end range of elbow flexion. Pt reports this was going on for awhile PTA. Recommending ortho consult. Feel pt would be excellent candidate for CIR. OT to continue to follow acutely.      Follow Up Recommendations  CIR    Equipment Recommendations  Other (comment) (TBD next venue)    Recommendations for Other Services Other (comment) (Ortho consult)     Precautions / Restrictions Precautions Precautions: Fall Precaution Comments: poor memory, failed mini-mental exam Restrictions Weight Bearing Restrictions: No      Mobility Bed Mobility Overal bed mobility: Needs Assistance Bed Mobility: Supine to Sit     Supine to sit: Min guard     General bed mobility comments: in recliner  Transfers Overall transfer level: Needs assistance Equipment used: Rolling walker (2 wheeled) Transfers: Sit to/from Stand Sit to Stand: Min assist         General transfer comment: min A for balance, cues for technique with rw    Balance Overall balance assessment: Needs assistance   Sitting balance-Leahy Scale:  Fair     Standing balance support: Bilateral upper extremity supported;During functional activity Standing balance-Leahy Scale: Poor Standing balance comment:                             ADL Overall ADL's : Needs assistance/impaired Eating/Feeding: Set up;Sitting   Grooming: Oral care;Min guard;Minimal assistance;Standing Grooming Details (indicate cue type and reason): balance Upper Body Bathing: Minimal assitance;Sitting   Lower Body Bathing: Minimal assistance;Sit to/from stand   Upper Body Dressing : Minimal assistance;Sitting   Lower Body Dressing: Minimal assistance;Sit to/from stand   Toilet Transfer: Minimal assistance;Ambulation;Comfort height toilet;Grab bars;RW   Toileting- Clothing Manipulation and Hygiene: Minimal assistance;Sit to/from stand   Tub/ Shower Transfer: Minimal assistance;Walk-in shower;Ambulation;3 in 1;Rolling walker   Functional mobility during ADLs: Minimal assistance;Rolling walker;Min guard General ADL Comments: Pt presents with Left side weakness, decreased balance, and impaired cognition impacting level of assist with ADLs. Pt ambulated to bathroom and completed grooming and toilet transfer as detailed above.      Vision Vision Assessment?: Yes Eye Alignment: Within Functional Limits Ocular Range of Motion: Within Functional Limits Alignment/Gaze Preference: Within Defined Limits Tracking/Visual Pursuits: Able to track stimulus in all quads without difficulty Saccades: Within functional limits Visual Fields: No apparent deficits Additional Comments: Able to read clock on wall and large letters on therapist badge up close   Perception     Praxis      Pertinent Vitals/Pain Pain Assessment: Faces Faces Pain Scale: Hurts even more Pain Location: Left shoulder at 80 degrees shoulder flexion/abduction; pt reports this has been bothering him  for awhile Pain Descriptors / Indicators: Grimacing Pain Intervention(s): Limited  activity within patient's tolerance;Monitored during session;Repositioned     Hand Dominance Right   Extremity/Trunk Assessment Upper Extremity Assessment Upper Extremity Assessment: LUE deficits/detail RUE Deficits / Details: grossly 4/5 LUE Deficits / Details: unable to fully assess shoulder flexion/abduction second to pain at 80 degrees. Elbow flexion 3/5(pain reported at end range in shoulder), elbow extension 3-/5; gross grip 3/5. Pt reports pain in shoulder is prexisting. LUE: Unable to fully assess due to pain LUE Coordination: decreased fine motor   Lower Extremity Assessment Lower Extremity Assessment: Defer to PT evaluation    Cervical / Trunk Assessment Cervical / Trunk Assessment:   Communication Communication Communication: No difficulties   Cognition Arousal/Alertness: Awake/alert Behavior During Therapy: WFL for tasks assessed/performed Overall Cognitive Status: Impaired/Different from baseline Area of Impairment: Attention;Memory;Safety/judgement;Problem solving   Current Attention Level: Sustained Memory: Decreased short-term memory   Safety/Judgement: Decreased awareness of safety;Decreased awareness of deficits  Problem Solving: Slow processing;Difficulty sequencing;Requires verbal cues General Comments:    General Comments    Pt reports, "I notice I'm crying sometimes when I'm watching TV. I don't normally do that." Pt reports he feels sad when he is crying.    Exercises       Shoulder Instructions      Home Living Family/patient expects to be discharged to:: Private residence Living Arrangements: Other relatives Available Help at Discharge: Family;Available 24 hours/day Type of Home: House Home Access: Stairs to enter [employer] of Steps: 2 Entrance Stairs-Rails: Right;Left Home Layout: Two level Alternate Level Stairs-Number of Steps: 12 Alternate Level Stairs-Rails: Right Bathroom Shower/Tub: Manufacturing engineer:  (sidestep through doorway with rw)   Home Equipment: Shower seat;Cane - quad   Additional Comments: pt reports he is alone during the day      Prior Functioning/Environment Level of Independence: Independent        Comments: inconsistent use of cane, pt able to do simple meal prep    OT Diagnosis: Cognitive deficits;Acute pain;Hemiplegia non-dominant side   OT Problem List: Decreased strength;Decreased range of motion;Decreased activity tolerance;Impaired balance (sitting and/or standing);Decreased coordination;Decreased cognition;Decreased safety awareness;Decreased knowledge of use of DME or AE;Decreased knowledge of precautions;Impaired UE functional use;Pain   OT Treatment/Interventions: Self-care/ADL training;Therapeutic exercise;Neuromuscular education;DME and/or AE instruction;Therapeutic activities;Cognitive remediation/compensation;Patient/family education;Balance training    OT Goals(Current goals can be found in the care plan section) Acute Rehab OT Goals Patient Stated Goal: didn't state, indicated agreement with CIR recommendation OT Goal Formulation: With patient/family Time For Goal Achievement: 05/08/15 Potential to Achieve Goals: Good ADL Goals Pt Will Perform Grooming: with modified independence;standing Pt Will Perform Upper Body Bathing: with modified independence;with adaptive equipment;sitting Pt Will Perform Lower Body Bathing: with modified independence;with adaptive equipment;sit to/from stand Pt Will Perform Upper Body Dressing: with modified independence;sitting Pt Will Perform Lower Body Dressing: with modified independence;with adaptive equipment;sit to/from stand Pt Will Transfer to Toilet: with modified independence;ambulating (3n1 over toilet) Pt Will Perform Toileting - Clothing Manipulation and hygiene: with modified independence;sitting/lateral leans;sit to/from stand Pt Will Perform Tub/Shower Transfer: Shower transfer;with  modified independence;3 in 1;rolling walker Pt/caregiver will Perform Home Exercise Program: Increased strength;Left upper extremity;With written HEP provided (within pain-free zone.)  OT Frequency: Min 2X/week   Barriers to D/C:            Co-evaluation              End of Session Equipment Utilized During Treatment: Gait belt;Rolling  walker Nurse Communication: Other (comment) (recommending Ortho consult)  Activity Tolerance: Patient tolerated treatment well Patient left: in chair;with call bell/phone within reach;with chair alarm set;Other (comment) (with rehab MD)   Time: 9983-3825 OT Time Calculation (min): 23 min Charges:  OT General Charges $OT Visit: 1 Procedure OT Evaluation $Initial OT Evaluation Tier I: 1 Procedure OT Treatments $Self Care/Home Management : 8-22 mins G-Codes:    Hortencia Pilar 04/27/2015, 11:21 AM

## 2015-04-24 NOTE — Progress Notes (Signed)
Results for Paul Hanson, Paul Hanson (MRN [id number]) as of 04/24/2015 15:27  Ref. Range 04/23/2015 08:16  Hemoglobin A1C Latest Ref Range: 4.8-5.6 % 13.0 (H)         Spoke to patient regarding A1C and potential need for insulin at discharge.  He states he was on oral medications prior to admit.  He is not currently checking blood sugars at home.  Reviewed signs and symptoms of hyperglycemia and patient endorses both thirst and frequent urination prior to admit.  Also discussed hypoglycemia signs and symptoms and proper treatment of low blood sugars.      Patient's sister was in the room and she will be helping patient with his care.  Ordered insulin starter kit, "Living Well with Diabetes" booklet and diabetes videos.  Will have bedside RN begin insulin teaching.  Family states that patient has appointment to get an orange card.  He will also need follow-up appointment with PCP.  Thanks, Adah Perl, RN, BC-ADM Inpatient Diabetes Coordinator Pager [phone] (8a-5p)

## 2015-04-25 ENCOUNTER — Inpatient Hospital Stay (HOSPITAL_COMMUNITY): Payer: Medicaid Other

## 2015-04-25 DIAGNOSIS — E1029 Type 1 diabetes mellitus with other diabetic kidney complication: Secondary | ICD-10-CM

## 2015-04-25 LAB — GLUCOSE, CAPILLARY
GLUCOSE-CAPILLARY: 236 mg/dL — AB (ref 65–99)
Glucose-Capillary: 170 mg/dL — ABNORMAL HIGH (ref 65–99)
Glucose-Capillary: 247 mg/dL — ABNORMAL HIGH (ref 65–99)
Glucose-Capillary: 217 mg/dL — ABNORMAL HIGH (ref 65–99)

## 2015-04-25 LAB — BASIC METABOLIC PANEL
Anion gap: 10 (ref 5–15)
BUN: 9 mg/dL (ref 6–20)
CHLORIDE: 106 mmol/L (ref 101–111)
CO2: 23 mmol/L (ref 22–32)
CREATININE: 0.83 mg/dL (ref 0.61–1.24)
Calcium: 9 mg/dL (ref 8.9–10.3)
GFR calc non Af Amer: >60 mL/min (ref >60)
Glucose, Bld: 183 mg/dL — ABNORMAL HIGH (ref 65–99)
Potassium: 3.6 mmol/L (ref 3.5–5.1)
Sodium: 139 mmol/L (ref 135–145)

## 2015-04-25 LAB — CBC
HCT: 36.8 % — ABNORMAL LOW (ref 39.0–52.0)
HEMOGLOBIN: 12.3 g/dL — AB (ref 13.0–17.0)
MCH: 26.8 pg (ref 26.0–34.0)
MCHC: 33.4 g/dL (ref 30.0–36.0)
MCV: 80.2 fL (ref 78.0–100.0)
Platelets: 201 10*3/uL (ref 150–400)
RBC: 4.59 MIL/uL (ref 4.22–5.81)
RDW: 14 % (ref 11.5–15.5)
WBC: 6.3 10*3/uL (ref 4.0–10.5)

## 2015-04-25 LAB — URINE CULTURE

## 2015-04-25 MED ORDER — CIPROFLOXACIN HCL 500 MG PO TABS
500.0000 mg | ORAL_TABLET | Freq: Two times a day (BID) | ORAL | Status: DC
  Administered 2015-04-25 – 2015-04-26 (×2): 500 mg via ORAL
  Filled 2015-04-25 (×2): qty 1

## 2015-04-25 NOTE — Clinical Social Work Note (Signed)
CSW consult acknowledged:  Clinical Social Worker received consult in reference to patient needing assistance with obtaining new machine to check blood sugars. Please refer to [facility]. CSW to notify RNCM.   Clinical Social Worker will sign off for now as social work intervention is no longer needed. Please consult [place] again if new need arises.  Glendon Axe, MSW, LCSWA [phone] 04/25/2015 1:31 PM

## 2015-04-25 NOTE — Progress Notes (Signed)
Occupational Therapy Treatment Patient Details Name: Paul Hanson MRN: [id number] DOB: 1965/11/13 Today's Date: 04/25/2015    History of present illness 49 year old male past mental history of previous CVA causing left hemiparesis, medication noncompliance and hypertension admitted on 8/7 evening with slurred speech and left-sided weakness which resolved. On admission, patient also noted a UTI and significant hyperglycemia with glucose of 513. Pt MRI revealed small L occipital infarct   OT comments  Pt progressing towards acute OT goals.  Pt completed ADLs and LUE exercises as detailed below in ADLs section. Pt min A for functional mobility without AD; close min guard with rw. Pt OOB with no AD upon therapist arrival. Educated pt on need to call for assist for OOB/mobility and set bed alarm at end of session. OT to continue to follow acutely. D/c plan remains appropriate.    Follow Up Recommendations  CIR    Equipment Recommendations  Other (comment)    Recommendations for Other Services Other (comment) (ortho consult)    Precautions / Restrictions Precautions Precautions: Fall Precaution Comments: poor memory, failed mini-mental exam Restrictions Weight Bearing Restrictions: No Other Position/Activity Restrictions: pain in right shoulder with flexion/abduction at 80 degrees       Mobility Bed Mobility Overal bed mobility: Needs Assistance Bed Mobility: Sit to Supine;Supine to Sit     Supine to sit: Supervision;HOB elevated Sit to supine: Supervision   General bed mobility comments: used bed rails, extra time   Transfers Overall transfer level: Needs assistance Equipment used: Rolling walker (2 wheeled) Transfers: Sit to/from Stand Sit to Stand: Min guard         General transfer comment: min guard with rw use    Balance Overall balance assessment: Needs assistance         Standing balance support: Bilateral upper extremity supported;During functional  activity Standing balance-Leahy Scale: Poor                     ADL Overall ADL's : Needs assistance/impaired     Grooming: Wash/dry hands;Min guard;Standing                   Toilet Transfer: Min guard;Ambulation;RW;Grab bars;Comfort height toilet   Toileting- Clothing Manipulation and Hygiene: Minimal assistance;Sit to/from stand       Functional mobility during ADLs: Minimal assistance;Min guard;Rolling walker General ADL Comments: Pt OOB with no AD upon therapist arrival. Min A for functional mobility without AD. Remainder of OOB activities completed with rw at min guard to min A level. Provided HEP with Level 1 theraband for LUE extension exercise and instructed in use including to stay in pain free range though pt reported no pain with extension exercise during session. Family present for session. Also issued theraputty and instructed on fine motor exercises. Educated pt on need to call for assist for OOB/mobility.      Vision                     Perception     Praxis      Cognition   Behavior During Therapy: [facility] for tasks assessed/performed Overall Cognitive Status: No family/caregiver present to determine baseline cognitive functioning Area of Impairment: Memory;Safety/judgement;Problem solving;Attention   Current Attention Level: Sustained Memory: Decreased short-term memory    Safety/Judgement: Decreased awareness of safety;Decreased awareness of deficits   Problem Solving: Slow processing      Extremity/Trunk Assessment               Exercises  Shoulder Instructions       General Comments      Pertinent Vitals/ Pain       Pain Assessment: Faces Faces Pain Scale: Hurts little more Pain Location: Left shoulder at 80 degrees shoulder flexion/abduction; pt reports this has been bothering him for awhile Pain Descriptors / Indicators: Grimacing Pain Intervention(s): Limited activity within patient's tolerance;Monitored  during session;Repositioned  Home Living                                          Prior Functioning/Environment              Frequency Min 2X/week     Progress Toward Goals  OT Goals(current goals can now be found in the care plan section)  Progress towards OT goals: Progressing toward goals  Acute Rehab OT Goals Patient Stated Goal: did not state OT Goal Formulation: With patient/family Time For Goal Achievement: 05/08/15 Potential to Achieve Goals: Good ADL Goals Pt Will Perform Grooming: with modified independence;standing Pt Will Perform Upper Body Bathing: with modified independence;with adaptive equipment;sitting Pt Will Perform Lower Body Bathing: with modified independence;with adaptive equipment;sit to/from stand Pt Will Perform Upper Body Dressing: with modified independence;sitting Pt Will Perform Lower Body Dressing: with modified independence;with adaptive equipment;sit to/from stand Pt Will Transfer to Toilet: with modified independence;ambulating Pt Will Perform Toileting - Clothing Manipulation and hygiene: with modified independence;sitting/lateral leans;sit to/from stand Pt Will Perform Tub/Shower Transfer: Shower transfer;with modified independence;3 in 1;rolling walker Pt/caregiver will Perform Home Exercise Program: Increased strength;Left upper extremity;With written HEP provided  Plan Discharge plan remains appropriate    Co-evaluation                 End of Session Equipment Utilized During Treatment: Rolling walker   Activity Tolerance Patient tolerated treatment well   Patient Left in bed;with call bell/phone within reach;with bed alarm set;with family/visitor present   Nurse Communication          Time: 4193-7902 OT Time Calculation (min): 23 min  Charges: OT General Charges $OT Visit: 1 Procedure OT Treatments $Self Care/Home Management : 23-37 mins  Hortencia Pilar 04/25/2015, 2:25 PM

## 2015-04-25 NOTE — Progress Notes (Signed)
Rehab admissions - Please see rehab consult done by Dr. Letta Pate recommending [facility] therapies.  Patient is doing too well to meet criteria for acute inpatient rehab admission.  Call me for questions.  #[phone]

## 2015-04-25 NOTE — Progress Notes (Signed)
Physical Therapy Treatment Patient Details Name: Paul Hanson MRN: [id number] DOB: August 20, 1966 Today's Date: 04/25/2015    History of Present Illness 49 year old male past mental history of previous CVA causing left hemiparesis, medication noncompliance and hypertension admitted on 8/7 evening with slurred speech and left-sided weakness which resolved. On admission, patient also noted a UTI and significant hyperglycemia with glucose of 513. Pt MRI revealed small L occipital infarct    PT Comments    Pt demonstrating good improvement with mobility at min A level today with and without AD. Would recommend trial with cane (AD pt used PTA) in future session as pt appears more stable with balance today than on eval (per PT notes) when performing gait without AD. Did not note unsafe or impulsive behaviors in session today; pt very pleasant and following directions appropriately. Session cut short due to transport arriving to take pt off unit. Pending CIR consult which pt would benefit from due to requiring continued skilled PT to address impairments in balance, strength, endurance, and safe mobility to prepare for d/c home at mod I level.    Follow Up Recommendations  CIR     Equipment Recommendations  None recommended by PT    Recommendations for Other Services       Precautions / Restrictions Precautions Precautions: Fall Restrictions Weight Bearing Restrictions: No    Mobility  Bed Mobility Overal bed mobility: Needs Assistance Bed Mobility: Supine to Sit     Supine to sit: Supervision     General bed mobility comments: Used bed rails for support no physical assist needed  Transfers Overall transfer level: Needs assistance Equipment used: Rolling walker (2 wheeled) Transfers: Sit to/from Stand Sit to Stand: Min guard         General transfer comment: Initially did not use AD for sit to stand with min A for balance; cues for hand placement with  RW  Ambulation/Gait Ambulation/Gait assistance: Min assist Ambulation Distance (Feet): 150 Feet Assistive device: None;Rolling walker (2 wheeled) Gait Pattern/deviations: Step-through pattern;Narrow base of support;Decreased weight shift to left;Decreased stance time - left;Decreased dorsiflexion - left     General Gait Details: Pt demonstrating improved balance with RW commpared to initial PT eval with pt requiring intermittent min A for balance and cues due to BOS narrowing. pt reports that he used a cane for mobility PTA due to h/o of CVA depending on good days and bad days. He reports he is bowlegged and has also kept his feet close togther. Gait without AD to challenge balance with min A as well for balance and support due to decreased weightshifting onto LLE. Pt would benefit from trial with cane in next session to determine safe device for mobility to prepare for d/c. Session cut short due to transport arriving to take pt to x-ray.   Stairs            Wheelchair Mobility    Modified Rankin (Stroke Patients Only)       Balance             Standing balance-Leahy Scale: Poor                      Cognition Arousal/Alertness: Awake/alert Behavior During Therapy: WFL for tasks assessed/performed Overall Cognitive Status: No family/caregiver present to determine baseline cognitive functioning                      Exercises      General Comments  Pertinent Vitals/Pain Pain Assessment: No/denies pain    Home Living                      Prior Function            PT Goals (current goals can now be found in the care plan section) Acute Rehab PT Goals Patient Stated Goal: did not state PT Goal Formulation: With patient Time For Goal Achievement: 05/01/15 Potential to Achieve Goals: Good Progress towards PT goals: Progressing toward goals    Frequency  Min 3X/week    PT Plan Current plan remains appropriate     Co-evaluation             End of Session   Activity Tolerance: Patient tolerated treatment well Patient left: Other (comment) (transport took pt in w/c to x-ray. RN aware)     Time: 8325-4982 PT Time Calculation (min) (ACUTE ONLY): 10 min  Charges:  $Gait Training: 8-22 mins                    G Codes:      Canary Brim Ivory Broad, PT, DPT Pager #: [phone]  04/25/2015, 11:41 AM

## 2015-04-25 NOTE — Progress Notes (Signed)
PROGRESS NOTE  Paul Hanson DXA:[id number] DOB: [birth year] DOA: 04/22/2015 PCP: No primary care provider on file.  HPI/Recap of past 41 hours: 49 year old male past mental history of previous CVA causing left hemiparesis, medication noncompliance and hypertension admitted on 8/7 evening with slurred speech and left-sided weakness which resolved. On admission, patient also noted a UTI and significant hyperglycemia with glucose of 513. Patient has been noncompliant with his medications. CT scan initially negative, MRI did note suspected acute CVA although location not related to patient's symptoms.   Workup felt to be secondary to small vessel disease, likely in the setting of very poor compliance and control of diabetes. At this point, patient is felt to need subcutaneous long-acting insulin and he has been transitioned to Lantus. His blood sugars have become stable. IV Rocephin started for patient's UTI, Patient has spiked low-grade fevers  on 8/8 and 9. Urine cultures grew out Proteus mirabilis pansensitive changed over to by mouth Cipro on 8/10.  Inpatient rehabilitation felt patient not appropriate candidate.   Assessment/Plan: Principal Problem:   Acute CVA/TIA (transient ischemic attack): Suspected from small vessel disease. Echocardiogram unrevealing other than incidentally noted chronic diastolic heart failure. Dopplers unrevealing. Inpatient rehabilitation turned down patient Awaiting PT OT eval if patient eligible for skilled nursing. Otherwise he will go home with home health services. A1c noting uncontrolled diabetes at 14 Continue medications.  Lipid panel notes LDL of 82 and the patient more compliant, do well with current Lipitor 80 mg.  Likely upon discharge, patient may need to be transitioned to insulin 70/30 insulin twice a day at same dose as current Lantus for affordability. Active Problems:    Hypertension: Stable   History of brain tumor   History of CVA (cerebrovascular  accident) with secondary left hemiparesis: Inpatient rehabilitation consult pending   H/O medication noncompliance: Counseled   UTI (urinary tract infection): Noted persistent fever spikes,, despite Rocephin. Cultures grew out pansensitive Proteus. Changed to by mouth Cipro.    Chronic diastolic heart failure: Incidentally noted on echocardiogram. Euvolemic. We'll provide education. Already on ACE inhibitor. Will add very low dose beta blocker  Acute kidney injury in the setting of Uncontrolled diabetes mellitus with hyperglycemia:  Following aggressive hydration, creatinine back to normal, this was likely the setting of acute on chronic hyperglycemia and secondary diuresis   Code Status: Full code  Family Communication: Multiple family members the bedside  Disposition Plan: If inpatient rehabilitation unable to take patient tomorrow, likely discharge home with home health   Consultants:  Neurology  Procedures:  Echocardiogram done 8/8: Grade 1 diastolic dysfunction  Carotid Dopplers done 8/8: No signs of significant stenoses  Antibiotics:  None   Objective: BP 123/71 mmHg  Pulse 68  Temp(Src) 98.7 F (37.1 C) (Oral)  Resp 20  Ht $R'5\' 8"'uj$  (1.727 m)  Wt 90.719 kg (200 lb)  BMI 30.42 kg/m2  SpO2 99%  Intake/Output Summary (Last 24 hours) at 04/25/15 1817 Last data filed at 04/25/15 0351  Gross per 24 hour  Intake      0 ml  Output    400 ml  Net   -400 ml   Filed Weights   04/22/15 1613  Weight: 90.719 kg (200 lb)    Exam: Stable  General:  Alert and oriented 3, no acute distress  Cardiovascular: Regular rate and rhythm, S1-S2  Respiratory: Clear to auscultation bilaterally  Abdomen: Soft, nontender, nondistended, positive bowel sounds  Musculoskeletal: No clubbing or cyanosis or edema  Neuro: Cranial nerves II  through XII intact, mild left-sided residual hemiparesis from old CVA   Data Reviewed: Basic Metabolic Panel:  Recent Labs Lab  04/22/15 1640 04/24/15 0947 04/25/15 0558  NA 134* 139 139  K 3.9 3.7 3.6  CL 97* 104 106  CO2 $Re'24 25 23  'Try$ GLUCOSE 513* 307* 183*  BUN $Re'17 10 9  'iiV$ CREATININE 1.64* 0.91 0.83  CALCIUM 9.3 9.6 9.0   Liver Function Tests: No results for input(s): AST, ALT, ALKPHOS, BILITOT, PROT, ALBUMIN in the last 168 hours. No results for input(s): LIPASE, AMYLASE in the last 168 hours. No results for input(s): AMMONIA in the last 168 hours. CBC:  Recent Labs Lab 04/22/15 1640 04/24/15 0947 04/25/15 0558  WBC 14.3* 11.4* 6.3  NEUTROABS 12.1*  --   --   HGB 12.9* 13.3 12.3*  HCT 38.6* 39.5 36.8*  MCV 80.2 80.1 80.2  PLT 229 231 201   Cardiac Enzymes:    Recent Labs Lab 04/22/15 1640  TROPONINI <0.03   BNP (last 3 results) No results for input(s): BNP in the last 8760 hours.  ProBNP (last 3 results) No results for input(s): PROBNP in the last 8760 hours.  CBG:  Recent Labs Lab 04/24/15 2131 04/24/15 2147 04/25/15 0620 04/25/15 1115 04/25/15 1614  GLUCAP 281* 258* 170* 247* 236*    Recent Results (from the past 240 hour(s))  Urine culture     Status: None   Collection Time: 04/22/15  5:29 PM  Result Value Ref Range Status   Specimen Description URINE, CLEAN CATCH  Final   Special Requests NONE  Final   Culture   Final    70,000 COLONIES/ml PROTEUS MIRABILIS Performed at [facility]    Report Status 04/25/2015 FINAL  Final   Organism ID, Bacteria PROTEUS MIRABILIS  Final      Susceptibility   Proteus mirabilis - MIC*    AMPICILLIN <=2 SENSITIVE Sensitive     CEFAZOLIN <=4 SENSITIVE Sensitive     CEFTRIAXONE <=1 SENSITIVE Sensitive     CIPROFLOXACIN <=0.25 SENSITIVE Sensitive     GENTAMICIN <=1 SENSITIVE Sensitive     IMIPENEM 2 SENSITIVE Sensitive     NITROFURANTOIN 128 RESISTANT Resistant     TRIMETH/SULFA <=20 SENSITIVE Sensitive     AMPICILLIN/SULBACTAM <=2 SENSITIVE Sensitive     PIP/TAZO <=4 SENSITIVE Sensitive     * 70,000 COLONIES/ml PROTEUS  MIRABILIS  Culture, blood (routine x 2)     Status: None (Preliminary result)   Collection Time: 04/23/15 10:29 PM  Result Value Ref Range Status   Specimen Description BLOOD LEFT ANTECUBITAL  Final   Special Requests BOTTLES DRAWN AEROBIC AND ANAEROBIC 5CC EA  Final   Culture NO GROWTH 1 DAY  Final   Report Status PENDING  Incomplete  Culture, blood (routine x 2)     Status: None (Preliminary result)   Collection Time: 04/23/15 10:48 PM  Result Value Ref Range Status   Specimen Description BLOOD LEFT HAND  Final   Special Requests BOTTLES DRAWN AEROBIC AND ANAEROBIC 5CC EA  Final   Culture NO GROWTH 1 DAY  Final   Report Status PENDING  Incomplete     Studies: Dg Shoulder Left  04/25/2015   CLINICAL DATA:  Acute left shoulder pain without recent injury. Initial encounter.  EXAM: LEFT SHOULDER - 2+ VIEW  COMPARISON:  None.  FINDINGS: There is no evidence of fracture or dislocation. There is no evidence of arthropathy or other focal bone abnormality. Soft tissues are unremarkable.  IMPRESSION: Normal left shoulder.   Electronically Signed   By: Marijo Conception, M.D.   On: 04/25/2015 11:50    Scheduled Meds: . atorvastatin  10 mg Oral q1800  . carvedilol  3.125 mg Oral BID WC  . cefTRIAXone (ROCEPHIN)  IV  1 g Intravenous Q24H  . clopidogrel  75 mg Oral Daily  . enoxaparin (LOVENOX) injection  40 mg Subcutaneous QHS  . insulin aspart  0-20 Units Subcutaneous TID WC  . insulin aspart  0-5 Units Subcutaneous QHS  . insulin glargine  12 Units Subcutaneous QHS  . insulin starter kit- syringes  1 kit Other Once    Continuous Infusions:    Time spent: 15 minutes  [facility] Pager [phone]. If 7PM-7AM, please contact night-coverage at [email or web address], password [facility] 04/25/2015, 6:17 PM  LOS: 3 days

## 2015-04-26 ENCOUNTER — Telehealth: Payer: Self-pay | Admitting: Dietician

## 2015-04-26 DIAGNOSIS — N390 Urinary tract infection, site not specified: Secondary | ICD-10-CM

## 2015-04-26 LAB — GLUCOSE, CAPILLARY
GLUCOSE-CAPILLARY: 222 mg/dL — AB (ref 65–99)
GLUCOSE-CAPILLARY: 222 mg/dL — AB (ref 65–99)

## 2015-04-26 MED ORDER — FREESTYLE SYSTEM KIT: 1.0000 | PACK | Status: DC | PRN

## 2015-04-26 MED ORDER — "INSULIN SYRINGE 29G X 1/2"" 0.3 ML MISC": Status: DC

## 2015-04-26 MED ORDER — GLUCOSE BLOOD VI STRP: ORAL_STRIP | Status: DC

## 2015-04-26 MED ORDER — INSULIN ASPART PROT & ASPART (70-30 MIX) 100 UNIT/ML ~~LOC~~ SUSP: SUBCUTANEOUS | Status: DC

## 2015-04-26 MED ORDER — CLOPIDOGREL BISULFATE 75 MG PO TABS: 75.0000 mg | ORAL_TABLET | Freq: Every day | ORAL | Status: DC

## 2015-04-26 MED ORDER — FREESTYLE LANCETS MISC: Status: DC

## 2015-04-26 NOTE — Telephone Encounter (Signed)
Note opened in error.

## 2015-04-26 NOTE — Discharge Summary (Signed)
Physician Discharge Summary  Paul Hanson ZOX:[id number] DOB: 18-May-1966 DOA: 04/22/2015  PCP: No primary care provider on file.  Admit date: 04/22/2015 Discharge date: 04/26/2015  Time spent: greater than 30 minutes  Recommendations for Outpatient Follow-up:  1. 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 On admission, patient also noted a UTI and significant hyperglycemia with glucose of 513. Patient has been noncompliant with his medications. CT scan initially negative, MRI did note suspected acute CVA although location not related to patient's symptoms.   Workup felt to be secondary to small vessel disease, likely in the setting of very poor compliance and control of diabetes. At this point, patient is felt to need subcutaneous long-acting insulin and he has been transitioned to Lantus. His blood sugars have become stable. Will transition to 70/30 insulin at discharge for cost.   IV Rocephin started for patient's UTI, Patient has spiked low-grade fevers on 8/8 and 9. Urine cultures grew out Proteus mirabilis pansensitive changed over to by mouth Cipro on 8/10. Inpatient rehabilitation felt  patient not appropriate candidate.    Acute CVA/TIA (transient ischemic attack): Suspected from small vessel disease. Echocardiogram unrevealing other than incidentally noted chronic diastolic heart failure. Dopplers unrevealing. Inpatient rehabilitation felt not a candidate for inpatient rehab.  will go home with home health services. A1c noting uncontrolled diabetes at 13 .  LDL of 82.  encouraged compliance with Lipitor 80 mg.    Hypertension: Stable   UTI (urinary tract infection): Noted persistent fever spikes,, despite Rocephin. Cultures grew out pansensitive Proteus. Changed to by mouth Cipro.   diastolic dysfunction noted on echocardiogram. No evidence of CHF  Acute kidney injury. Improved with hydration and control of diabetes.  Consultants:  Neurology  Procedures:  Echocardiogram done 8/8: Grade 1 diastolic dysfunction  Carotid Dopplers done 8/8: No signs of significant stenoses  Discharge Exam: Filed Vitals:   04/26/15 0929  BP: 115/73  Pulse: 72  Temp: 97.8 F (36.6 C)  Resp: 20    General: a and o Cardiovascular: RRR without MGR Respiratory: CTA without wRR Neuro: nonfocal  Discharge Instructions   Discharge Instructions    Ambulatory referral to Neurology    Complete by:  As directed   Please schedule post stroke follow up in 2 months.     Diet - low sodium heart healthy    Complete by:  As directed      Diet Carb Modified    Complete by:  As directed      Discharge instructions    Complete by:  As directed   Monitor blood glucose twice daily before injection     Increase activity slowly    Complete by:  As directed  Current Discharge Medication List    START taking these medications   Details  clopidogrel (PLAVIX) 75 MG tablet Take 1 tablet (75 mg total) by mouth daily. Qty: 30 tablet, Refills: 0    glucose blood test strip Use as instructed Qty: 100 each, Refills: 12    glucose monitoring kit (FREESTYLE) monitoring kit 1  each by Does not apply route as needed for other. As directed Qty: 1 each, Refills: 0    insulin aspart protamine- aspart (NOVOLOG MIX 70/30) (70-30) 100 UNIT/ML injection 8 units subcutaneous every morning at breakfast and 4 units subcutaneous every evening at supper. Qty: 10 mL, Refills: 11    Insulin Syringe-Needle U-100 (INSULIN SYRINGE .3CC/29GX1/2") 29G X 1/2" 0.3 ML MISC As directed Qty: 100 each, Refills: 0    Lancets (FREESTYLE) lancets Use as instructed Qty: 100 each, Refills: 12      CONTINUE these medications which have NOT CHANGED   Details  atorvastatin (LIPITOR) 80 MG tablet Take 80 mg by mouth daily.    lisinopril (PRINIVIL,ZESTRIL) 2.5 MG tablet Take 2.5 mg by mouth daily.      STOP taking these medications     aspirin EC 81 MG tablet      glipiZIDE (GLUCOTROL) 5 MG tablet        No Known Allergies Follow-up Information    Follow up with SETHI,PRAMOD, MD In 2 weeks.   Specialties:  Neurology, Radiology   Why:  Stroke Clinic, Office will call you with appointment date & time   Contact information:   [address] [place] [address] [phone]        The results of significant diagnostics from this hospitalization (including imaging, microbiology, ancillary and laboratory) are listed below for reference.    Significant Diagnostic Studies: Dg Chest 2 View  04/23/2015   CLINICAL DATA:  Transient ischemic attack  EXAM: CHEST  2 VIEW  COMPARISON:  None.  FINDINGS: Lungs are clear. Heart is upper normal in size with pulmonary vascularity within normal limits. No adenopathy. No bone lesions.  IMPRESSION: No edema or consolidation.   Electronically Signed   By: William  Woodruff III M.D.   On: 04/23/2015 02:52   Ct Head Wo Contrast  04/22/2015   CLINICAL DATA:  Pt with left sided lower extremity weakness and unsteady gait, hx stroke, hx craniotomy for benign tumor 20+ yrs ago  EXAM: CT HEAD WITHOUT CONTRAST  TECHNIQUE: Contiguous axial images  were obtained from the base of the skull through the vertex without intravenous contrast.  COMPARISON:  None.  FINDINGS: The ventricles are normal in configuration. There is mild ventricular and sulcal enlargement reflecting volume loss mildly advanced for age. No hydrocephalus.  There are no parenchymal masses or mass effect. There is encephalomalacia in the left cerebellar hemisphere deep to a left craniectomy defect consistent with the history of resection of a benign tumor. There are calcifications along both cerebellar hemispheres, more prominent on the left. There is no evidence of a cortical infarct. Small old lacune infarct projects adjacent to the genu of the right internal capsule.  There are no extra-axial masses or abnormal fluid collections.  There is no intracranial hemorrhage.  There is expansion of the right posterior frontal and parietal bone with areas of lucency and sclerosis within the expanded diploic space. This has a chronic, benign appearance but is of unclear etiology. There is no upper pole in the right occipital bone superiorly.  Visualized sinuses and mastoid air cells are clear.    IMPRESSION: 1. No acute intracranial abnormalities. 2. Volume loss advanced for age. 3. Status post left occipital craniectomy with underlying left cerebellar encephalomalacia. 4. Right posterior frontal/parietal bone expansion. This has a chronic/benign appearance. It could be from prior surgery. It is of unclear etiology, however.   Electronically Signed   By: Lajean Manes M.D.   On: 04/22/2015 17:10   [name] Contrast  04/23/2015   CLINICAL DATA:  5 minutes episode of slurred speech today. Fall without head trauma. Baseline LEFT hemi paresis. History of stroke, diabetes, hypertension, brain tumor.  EXAM: MRI HEAD WITHOUT CONTRAST  MRA HEAD WITHOUT CONTRAST  TECHNIQUE: Multiplanar, multiecho pulse sequences of the brain and surrounding structures were obtained without intravenous contrast. Angiographic  images of the head were obtained using MRA technique without contrast.  COMPARISON:  CT head April 22, 2015 at 1659 hours.  FINDINGS: MRI HEAD FINDINGS  Subcentimeter focus of reduced diffusion LEFT occipital lobe on axial image 18/98 though, not identified on the coronal DWI . Numerous small foci of susceptibility artifact throughout the infra and supratentorial brain, general distribution. Ventricles and sulci are overall normal for patient's age. Patchy supratentorial and pontine white matter T2 hyperintensities. LEFT cerebellar encephalomalacia with faint susceptibility artifact consistent with prior surgery, overlying craniotomy. RIGHT globus pallidus probable lacunar infarct, less likely perivascular space given morphology. No midline shift. Mass effect on the RIGHT frontoparietal cerebrum due to expansile dove slowed pace base mass.  No abnormal extra-axial fluid collections. Ocular globes and orbital contents are unremarkable. Small bilateral maxillary mucosal retention cyst. Trace LEFT mastoid effusion. Generalized heterogeneous bone marrow signal. No abnormal sellar expansion. No cerebellar tonsillar ectopia.  MRA HEAD FINDINGS  Anterior circulation: Normal flow related enhancement of the included cervical, petrous, cavernous and supra clinoid internal carotid arteries. Moderate stenosis RIGHT para clinoid internal carotid artery. Patent anterior communicating artery. Patent enhancement of the anterior and middle cerebral arteries, including more distal segments. Mild stenosis LEFT M1 segment.  No large vessel occlusion, high-grade stenosis, abnormal luminal irregularity, aneurysm.  Posterior circulation: LEFT vertebral artery is dominant. Basilar artery is patent, with flow related enhancement of the main branch vessels. Moderate stenosis proximal basilar artery, with poststenotic dilatation and, subsequent moderate stenosis mid basilar artery. Fetal origin LEFT posterior cerebral artery, occluded LEFT P2  segment proximally. Fenestrated RIGHT P1 segment, widely patent. Robust RIGHT posterior communicating artery.  No large vessel occlusion, high-grade stenosis, aneurysm. Moderate luminal irregularity of the vertebral arteries and basilar artery.  IMPRESSION: MRI HEAD: Subcentimeter probable acute LEFT occipital lobe infarct, less likely artifact related to mineralization.  Numerous small intraparenchymal foci of susceptibility artifact in a pattern suggesting chronic hypertension. Moderate white matter changes compatible with chronic small vessel ischemic disease.  Status post LEFT suboccipital craniectomy, LEFT cerebellar resection cavity.  Expansile RIGHT calvarium, this could reflect perinatal hematoma, possible vascular lesion without suspicious features though, would be better characterized on contrast-enhanced imaging with fat saturation as clinically indicated.  MRA HEAD: LEFT posterior cerebral artery (P2) occlusion.  Multifocal moderate basilar artery stenosis in a background of moderate posterior circulation luminal irregularity likely representing atherosclerosis or possible post radiation changes, recommend correlation with treatment history.   Electronically Signed   By: Elon Alas M.D.   On: 04/23/2015 00:47   Dg Shoulder Left  04/25/2015   CLINICAL DATA:  Acute left shoulder pain without recent injury. Initial encounter.  EXAM: LEFT SHOULDER - 2+ VIEW  COMPARISON:  None.  FINDINGS: There is no evidence of fracture  or dislocation. There is no evidence of arthropathy or other focal bone abnormality. Soft tissues are unremarkable.  IMPRESSION: Normal left shoulder.   Electronically Signed   By: James  Green Jr, M.D.   On: 04/25/2015 11:50   Mr Mra Head/brain Wo Cm  04/23/2015   CLINICAL DATA:  5 minutes episode of slurred speech today. Fall without head trauma. Baseline LEFT hemi paresis. History of stroke, diabetes, hypertension, brain tumor.  EXAM: MRI HEAD WITHOUT CONTRAST  MRA HEAD WITHOUT  CONTRAST  TECHNIQUE: Multiplanar, multiecho pulse sequences of the brain and surrounding structures were obtained without intravenous contrast. Angiographic images of the head were obtained using MRA technique without contrast.  COMPARISON:  CT head April 22, 2015 at 1659 hours.  FINDINGS: MRI HEAD FINDINGS  Subcentimeter focus of reduced diffusion LEFT occipital lobe on axial image 18/98 though, not identified on the coronal DWI . Numerous small foci of susceptibility artifact throughout the infra and supratentorial brain, general distribution. Ventricles and sulci are overall normal for patient's age. Patchy supratentorial and pontine white matter T2 hyperintensities. LEFT cerebellar encephalomalacia with faint susceptibility artifact consistent with prior surgery, overlying craniotomy. RIGHT globus pallidus probable lacunar infarct, less likely perivascular space given morphology. No midline shift. Mass effect on the RIGHT frontoparietal cerebrum due to expansile dove slowed pace base mass.  No abnormal extra-axial fluid collections. Ocular globes and orbital contents are unremarkable. Small bilateral maxillary mucosal retention cyst. Trace LEFT mastoid effusion. Generalized heterogeneous bone marrow signal. No abnormal sellar expansion. No cerebellar tonsillar ectopia.  MRA HEAD FINDINGS  Anterior circulation: Normal flow related enhancement of the included cervical, petrous, cavernous and supra clinoid internal carotid arteries. Moderate stenosis RIGHT para clinoid internal carotid artery. Patent anterior communicating artery. Patent enhancement of the anterior and middle cerebral arteries, including more distal segments. Mild stenosis LEFT M1 segment.  No large vessel occlusion, high-grade stenosis, abnormal luminal irregularity, aneurysm.  Posterior circulation: LEFT vertebral artery is dominant. Basilar artery is patent, with flow related enhancement of the main branch vessels. Moderate stenosis proximal  basilar artery, with poststenotic dilatation and, subsequent moderate stenosis mid basilar artery. Fetal origin LEFT posterior cerebral artery, occluded LEFT P2 segment proximally. Fenestrated RIGHT P1 segment, widely patent. Robust RIGHT posterior communicating artery.  No large vessel occlusion, high-grade stenosis, aneurysm. Moderate luminal irregularity of the vertebral arteries and basilar artery.  IMPRESSION: MRI HEAD: Subcentimeter probable acute LEFT occipital lobe infarct, less likely artifact related to mineralization.  Numerous small intraparenchymal foci of susceptibility artifact in a pattern suggesting chronic hypertension. Moderate white matter changes compatible with chronic small vessel ischemic disease.  Status post LEFT suboccipital craniectomy, LEFT cerebellar resection cavity.  Expansile RIGHT calvarium, this could reflect perinatal hematoma, possible vascular lesion without suspicious features though, would be better characterized on contrast-enhanced imaging with fat saturation as clinically indicated.  MRA HEAD: LEFT posterior cerebral artery (P2) occlusion.  Multifocal moderate basilar artery stenosis in a background of moderate posterior circulation luminal irregularity likely representing atherosclerosis or possible post radiation changes, recommend correlation with treatment history.   Electronically Signed   By: Courtnay  Bloomer M.D.   On: 04/23/2015 00:47    Microbiology: Recent Results (from the past 240 hour(s))  Urine culture     Status: None   Collection Time: 04/22/15  5:29 PM  Result Value Ref Range Status   Specimen Description URINE, CLEAN CATCH  Final   Special Requests NONE  Final   Culture   Final    70,000 COLONIES/ml PROTEUS   MIRABILIS Performed at [facility]    Report Status 04/25/2015 FINAL  Final   Organism ID, Bacteria PROTEUS MIRABILIS  Final      Susceptibility   Proteus mirabilis - MIC*    AMPICILLIN <=2 SENSITIVE Sensitive     CEFAZOLIN  <=4 SENSITIVE Sensitive     CEFTRIAXONE <=1 SENSITIVE Sensitive     CIPROFLOXACIN <=0.25 SENSITIVE Sensitive     GENTAMICIN <=1 SENSITIVE Sensitive     IMIPENEM 2 SENSITIVE Sensitive     NITROFURANTOIN 128 RESISTANT Resistant     TRIMETH/SULFA <=20 SENSITIVE Sensitive     AMPICILLIN/SULBACTAM <=2 SENSITIVE Sensitive     PIP/TAZO <=4 SENSITIVE Sensitive     * 70,000 COLONIES/ml PROTEUS MIRABILIS  Culture, blood (routine x 2)     Status: None (Preliminary result)   Collection Time: 04/23/15 10:29 PM  Result Value Ref Range Status   Specimen Description BLOOD LEFT ANTECUBITAL  Final   Special Requests BOTTLES DRAWN AEROBIC AND ANAEROBIC 5CC EA  Final   Culture NO GROWTH 2 DAYS  Final   Report Status PENDING  Incomplete  Culture, blood (routine x 2)     Status: None (Preliminary result)   Collection Time: 04/23/15 10:48 PM  Result Value Ref Range Status   Specimen Description BLOOD LEFT HAND  Final   Special Requests BOTTLES DRAWN AEROBIC AND ANAEROBIC 5CC EA  Final   Culture NO GROWTH 2 DAYS  Final   Report Status PENDING  Incomplete     Labs: Basic Metabolic Panel:  Recent Labs Lab 04/22/15 1640 04/24/15 0947 04/25/15 0558  NA 134* 139 139  K 3.9 3.7 3.6  CL 97* 104 106  CO2 _0 GLUCOSE 513* 307* 183*  BUN _1 CREATININE 1.64* 0.91 0.83  CALCIUM 9.3 9.6 9.0   Liver Function Tests: No results for input(s): AST, ALT, ALKPHOS, BILITOT, PROT, ALBUMIN in the last 168 hours. No results for input(s): LIPASE, AMYLASE in the last 168 hours. No results for input(s): AMMONIA in the last 168 hours. CBC:  Recent Labs Lab 04/22/15 1640 04/24/15 0947 04/25/15 0558  WBC 14.3* 11.4* 6.3  NEUTROABS 12.1*  --   --   HGB 12.9* 13.3 12.3*  HCT 38.6* 39.5 36.8*  MCV 80.2 80.1 80.2  PLT 229 231 201   Cardiac Enzymes:  Recent Labs Lab 04/22/15 1640  TROPONINI <0.03   BNP: BNP (last 3 results) No results for input(s): BNP in the last 8760 hours.  ProBNP (last 3  results) No results for input(s): PROBNP in the last 8760 hours.  CBG:  Recent Labs Lab 04/25/15 1115 04/25/15 1614 04/25/15 2156 04/26/15 0626 04/26/15 1128  GLUCAP 247* 236* 217* 222* 222*       Signed:  Akiya Morr L  [facility] 04/26/2015, 1:14 PM

## 2015-04-26 NOTE — Progress Notes (Signed)
Inpatient Diabetes Program Recommendations  AACE/ADA: New Consensus Statement on Inpatient Glycemic Control (2013)  Target Ranges:  Prepandial:   less than 140 mg/dL      Peak postprandial:   less than 180 mg/dL (1-2 hours)      Critically ill patients:  140 - 180 mg/dL    Inpatient Diabetes Program Recommendations Insulin - Basal: Glucose running consistently in 200 range-please consider increase in lantus from 12 units to 15020 units Correction (SSI): xxxxxxxxxxxx Insulin - Meal Coverage: May need meal coverage at home and/or correction insulin. HgbA1C: A1C at 13% May want to start some meal coverage while here-at 3 units tidwc  Thank you Rosita Kea, RN, MSN, CDE  Diabetes Inpatient Program Office: [phone] Pager: [phone] 8:00 am to 5:00 pm

## 2015-04-26 NOTE — Progress Notes (Signed)
Patient is being d/c home on insulin, education provided to patient insulin self administration and patient demonstrated understanding.

## 2015-04-26 NOTE — Care Management Note (Signed)
Case Management Note  Patient Details  Name: Paul Hanson MRN: [id number] Date of Birth: 1966-04-19  Subjective/Objective:                    Action/Plan: Met with patient and family to discuss discharge planning. Patient has home health orders for potential discharge home today. Patient was admitted as uninsured and is being followed by Benjamine Mola in [employer].  Patient is agreeable to home health services and is aware that he will only qualify for HHPT/RN/CSW at this time.  Miranda with [facility] was notified and has accepted the referral.  Patient states that he has been relying on his family for assistance paying with medication.  Patient's sister states that she is hoping that there will be assistance available/he will qualify for Medicaid to help lessen the burden.  CM will follow for discharge medications and MATCH as appropriate.  Patient has an appointment scheduled at the [facility] on 05/03/15.  Expected Discharge Date:                  Expected Discharge Plan:  [place]  In-House Referral:  Financial Counselor  Discharge planning Services  CM Consult, [place] [facility], [facility], Follow-up appt scheduled, Medication Assistance  Post Acute Care Choice:    Choice offered to:  Patient  DME Arranged:    DME Agency:     HH Arranged:  RN, PT (CSW) [place]:  [place]  Status of Service:  In process, will continue to follow  Medicare Important Message Given:    Date Medicare IM Given:    Medicare IM give by:    Date Additional Medicare IM Given:    Additional Medicare Important Message give by:     If discussed at [place] of Stay Meetings, dates discussed:    Additional Comments:  Rolm Baptise, RN 04/26/2015, 10:34 AM [phone]

## 2015-04-29 LAB — CULTURE, BLOOD (ROUTINE X 2)
CULTURE: NO GROWTH
Culture: NO GROWTH

## 2015-05-02 ENCOUNTER — Other Ambulatory Visit: Payer: Self-pay

## 2015-05-02 NOTE — Patient Outreach (Signed)
Patient agreed to receive [facility] calls for Stroke. Calls will begin 05/03/15.

## 2015-05-03 ENCOUNTER — Encounter: Payer: Self-pay | Admitting: Internal Medicine

## 2015-05-03 ENCOUNTER — Ambulatory Visit (INDEPENDENT_AMBULATORY_CARE_PROVIDER_SITE_OTHER): Payer: Self-pay | Admitting: Internal Medicine

## 2015-05-03 VITALS — BP 140/80 | HR 77 | Temp 97.8°F | Ht 68.0 in | Wt 204.8 lb

## 2015-05-03 DIAGNOSIS — E1310 Other specified diabetes mellitus with ketoacidosis without coma: Secondary | ICD-10-CM

## 2015-05-03 DIAGNOSIS — I639 Cerebral infarction, unspecified: Secondary | ICD-10-CM

## 2015-05-03 DIAGNOSIS — I10 Essential (primary) hypertension: Secondary | ICD-10-CM

## 2015-05-03 DIAGNOSIS — I69398 Other sequelae of cerebral infarction: Secondary | ICD-10-CM

## 2015-05-03 DIAGNOSIS — Z8673 Personal history of transient ischemic attack (TIA), and cerebral infarction without residual deficits: Secondary | ICD-10-CM

## 2015-05-03 DIAGNOSIS — Z79899 Other long term (current) drug therapy: Secondary | ICD-10-CM

## 2015-05-03 DIAGNOSIS — E119 Type 2 diabetes mellitus without complications: Secondary | ICD-10-CM | POA: Insufficient documentation

## 2015-05-03 DIAGNOSIS — E1110 Type 2 diabetes mellitus with ketoacidosis without coma: Secondary | ICD-10-CM

## 2015-05-03 DIAGNOSIS — I69354 Hemiplegia and hemiparesis following cerebral infarction affecting left non-dominant side: Secondary | ICD-10-CM

## 2015-05-03 DIAGNOSIS — Z794 Long term (current) use of insulin: Secondary | ICD-10-CM

## 2015-05-03 DIAGNOSIS — R208 Other disturbances of skin sensation: Secondary | ICD-10-CM

## 2015-05-03 MED ORDER — LISINOPRIL 5 MG PO TABS: 5.0000 mg | ORAL_TABLET | Freq: Every day | ORAL | Status: DC

## 2015-05-03 MED ORDER — METFORMIN HCL 500 MG PO TABS: 500.0000 mg | ORAL_TABLET | Freq: Every day | ORAL | Status: DC

## 2015-05-03 NOTE — Progress Notes (Signed)
Patient ID: Paul Hanson, male   DOB: 1966/02/24, 49 y.o.   MRN: [id number]   Subjective:   Patient ID: Paul Hanson male   DOB: [birth year] 49 y.o.   MRN: [id number]  HPI: [name] is a 49 y.o. male with a PMH of CVA, DM type 2, HTN, Brain Tumor s/p craniotomy and resection here today for hospital follow up and to establish care.   Please refer to problem based charting for details of today's visit.  Past Medical History  Diagnosis Date  . Stroke   . Diabetes mellitus without complication   . Hypertension   . Brain tumor    Current Outpatient Prescriptions  Medication Sig Dispense Refill  . atorvastatin (LIPITOR) 80 MG tablet Take 80 mg by mouth daily.    . clopidogrel (PLAVIX) 75 MG tablet Take 1 tablet (75 mg total) by mouth daily. 30 tablet 0  . glucose blood test strip Use as instructed 100 each 12  . glucose monitoring kit (FREESTYLE) monitoring kit 1 each by Does not apply route as needed for other. As directed 1 each 0  . insulin aspart protamine- aspart (NOVOLOG MIX 70/30) (70-30) 100 UNIT/ML injection 8 units subcutaneous every morning at breakfast and 4 units subcutaneous every evening at supper. 10 mL 11  . Insulin Syringe-Needle U-100 (INSULIN SYRINGE .3CC/29GX1/2") 29G X 1/2" 0.3 ML MISC As directed 100 each 0  . Lancets (FREESTYLE) lancets Use as instructed 100 each 12  . lisinopril (PRINIVIL,ZESTRIL) 2.5 MG tablet Take 2.5 mg by mouth daily.     No current facility-administered medications for this visit.   No family history on file. Social History   Social History  . Marital Status: Single    Spouse Name: N/A  . Number of Children: N/A  . Years of Education: N/A   Social History Main Topics  . Smoking status: Never Smoker   . Smokeless tobacco: Not on file  . Alcohol Use: No  . Drug Use: No  . Sexual Activity: Not on file   Other Topics Concern  . Not on file   Social History Narrative  . No narrative on file   Review of Systems: Review of  Systems  Constitutional: Negative for fever and chills.  Eyes: Negative for blurred vision and double vision.  Respiratory: Negative for shortness of breath.   Cardiovascular: Negative for chest pain and palpitations.  Gastrointestinal: Negative for nausea, vomiting and abdominal pain.  Genitourinary: Negative for dysuria, urgency and frequency.  Neurological: Positive for sensory change, focal weakness and weakness. Negative for dizziness and speech change.  Psychiatric/Behavioral: Negative for depression.   Objective:  Physical Exam: Filed Vitals:   05/03/15 1524  BP: 140/80  Pulse: 77  Temp: 97.8 F (36.6 C)  TempSrc: Oral  Height: $Remove'5\' 8"'vJFaDBP$  (1.727 m)  Weight: 204 lb 12.8 oz (92.897 kg)  SpO2: 100%   GENERAL- alert, co-operative, appears as stated age, not in any distress. HEENT- Atraumatic, normocephalic, PERRL, EOMI, oral mucosa appears moist, good and intact dentition. No carotid bruit, no cervical LN enlargement, thyroid does not appear enlarged, neck supple. CARDIAC- RRR, no murmurs, rubs or gallops. RESP- Moving equal volumes of air, and clear to auscultation bilaterally, no wheezes or crackles. ABDOMEN- Soft, nontender, no guarding or rebound, no palpable masses or organomegaly, bowel sounds present. BACK- Normal curvature of the spine, No tenderness along the vertebrae, no CVA tenderness. NEURO- No obvious Cr N abnormality, strength right upper and lower extremities- 5/5, strength right upper and lower  extremities- 4/5, Sensation decreased on left, DTRs- Normal, finger to nose test normal bilat, rapid alternating movement- intact, Gait- Difficulty ambulating 2/2 weakness in left leg, uses a cane. EXTREMITIES- pulse 2+, symmetric, no pedal edema. SKIN- Warm, dry, No rash or lesion. PSYCH- Normal mood and affect, appropriate thought content and speech.  Assessment & Plan:   Case discussed with Dr. Beryle Beams. Please refer to Problem based carting for further details of  today's visit.

## 2015-05-03 NOTE — Patient Instructions (Signed)
General Instructions: Thank you for coming in today  - I have increased your Lisinopril to 5 mg daily - We will start you on Metformin 500 mg daily, it can cause some GI upset with diarrhea but this usually improves over a few weeks - Will schedule you a meeting with our Diabetes Educator, Butch Penny Plyler  - Please follow up with Harland Dingwall about your [employer] - I have put in a referral for speech therapy - We are contacting the Neurologist office to set up a follow up with them. We will call you when we have something scheduled for you. - Please continue to check your blood sugars and blood pressures at home. Bring your meter, log and all of your medications with you to your next visit. - Return to clinic in 2 weeks.

## 2015-05-03 NOTE — Progress Notes (Signed)
Patient ID: Paul Hanson, male   DOB: 06-23-1966, 49 y.o.   MRN: [id number]   Subjective:   Patient ID: Paul Hanson male   DOB: July 08, 1966 49 y.o.   MRN: [id number]  HPI: [name] is a 48 y.o. male with a PMH of HTN, DM type 2, CVA in 2014 and 2016, Brain tumor s/p craniotomy and resection here today for hospital follow up of CVA and to establish care.  For details of today's visit please refer to problem based charting.  Past Medical History  Diagnosis Date  . Stroke   . Diabetes mellitus without complication   . Hypertension   . Brain tumor    Current Outpatient Prescriptions  Medication Sig Dispense Refill  . atorvastatin (LIPITOR) 80 MG tablet Take 80 mg by mouth daily.    . clopidogrel (PLAVIX) 75 MG tablet Take 1 tablet (75 mg total) by mouth daily. 30 tablet 0  . glucose blood test strip Use as instructed 100 each 12  . glucose monitoring kit (FREESTYLE) monitoring kit 1 each by Does not apply route as needed for other. As directed 1 each 0  . insulin aspart protamine- aspart (NOVOLOG MIX 70/30) (70-30) 100 UNIT/ML injection 8 units subcutaneous every morning at breakfast and 4 units subcutaneous every evening at supper. 10 mL 11  . Insulin Syringe-Needle U-100 (INSULIN SYRINGE .3CC/29GX1/2") 29G X 1/2" 0.3 ML MISC As directed 100 each 0  . Lancets (FREESTYLE) lancets Use as instructed 100 each 12  . lisinopril (PRINIVIL,ZESTRIL) 2.5 MG tablet Take 2.5 mg by mouth daily.     No current facility-administered medications for this visit.   Family History  Problem Relation Age of Onset  . Heart disease Mother   . Hyperlipidemia Father   . Hypertension Father    Social History   Social History  . Marital Status: Single    Spouse Name: N/A  . Number of Children: N/A  . Years of Education: N/A   Social History Main Topics  . Smoking status: Never Smoker   . Smokeless tobacco: None  . Alcohol Use: 0.6 - 1.2 oz/week    1-2 Shots of liquor per week     Comment:  3-4 times a year  . Drug Use: No  . Sexual Activity: Not Asked   Other Topics Concern  . None   Social History Narrative   Review of Systems: Review of Systems  Constitutional: Negative for fever and chills.  Eyes: Negative for blurred vision and double vision.  Respiratory: Negative for shortness of breath.   Cardiovascular: Negative for chest pain and palpitations.  Gastrointestinal: Negative for nausea, vomiting, abdominal pain, diarrhea and constipation.  Genitourinary: Negative for dysuria.  Neurological: Positive for sensory change, focal weakness and weakness. Negative for dizziness, tingling, speech change and headaches.   Objective:  Physical Exam: Filed Vitals:   05/03/15 1524  BP: 140/80  Pulse: 77  Temp: 97.8 F (36.6 C)  TempSrc: Oral  Height: $Remove'5\' 8"'BRJxVZO$  (1.727 m)  Weight: 204 lb 12.8 oz (92.897 kg)  SpO2: 100%   GENERAL- alert, co-operative, appears as stated age, not in any distress. HEENT- Atraumatic, normocephalic, PERRL, EOMI, oral mucosa appears moist, good and intact dentition. No carotid bruit, no cervical LN enlargement, thyroid does not appear enlarged, neck supple. CARDIAC- RRR, no murmurs, rubs or gallops. RESP- Moving equal volumes of air, and clear to auscultation bilaterally, no wheezes or crackles. ABDOMEN- Soft, nontender, no guarding or rebound, no palpable masses or organomegaly, bowel sounds present.  BACK- Normal curvature of the spine, No tenderness along the vertebrae, no CVA tenderness. NEURO- No obvious Cr N abnormality, strength on right upper and lower extremities- 5/5, strength on left upper and lower extremities- 4/5,Sensation decreased on left side in face and both LUE and LLE, Gait- left foot dragging EXTREMITIES- pulse 2+, symmetric, no pedal edema. SKIN- Warm, dry, No rash or lesion. PSYCH- Normal mood and affect, appropriate thought content and speech.  Assessment & Plan:   Case discussed with Dr. Beryle Beams. Please refer to  Problem based carting for further details of today's visit.

## 2015-05-04 NOTE — Assessment & Plan Note (Signed)
BP Readings from Last 3 Encounters:  05/03/15 140/80  04/26/15 128/71    Lab Results  Component Value Date   NA 139 04/25/2015   K 3.6 04/25/2015   CREATININE 0.83 04/25/2015    Assessment: Blood pressure control:  controlled Progress toward BP goal:   at goal Comments: Patient was previously seen by Dr. Windell Hummingbird at [facility] in [facility]. Has not been seen there in >9 months since he lost his insurance. Reports that he has been on Lisinopril 2.5 mg daily since he started seeing her in 2014 following a CVA and he was discovered to be Diabetic and Hypertensive.  Today in clinic his BP is 140/80. Reports at home his SBP usually runs in the 140-150.   Plan: Medications:  Will increae Lisinopril to 5 mg daily Educational resources provided:   Self management tools provided:   Other plans: RTC In 2 weeks for recheck and BMP. Asked to continue measuring his BP at home and keeping a log of this to bring to next visit. May benefit for more aggressive BP goal of 130/80 given his young age and CVA history as studies have suggested that intensive blood pressure lowering in patients with diabetes significantly lowers the incidence of stroke (2 versus 3.1 percent) despite not significantly lower the risk of mortality.

## 2015-05-04 NOTE — Assessment & Plan Note (Addendum)
Patient has a history of CVA in 07/2013 with residual left sided weakness and decreased sensation and is s/p craniotomy and resection for a brain tumor in the early 2000s. Was found to be Diabetic and Hypertensive at that time. Has been followed by Dr. Windell Hanson at [facility] in [facility] until >9 months ago when he lost his insurance. Reports he was taking Lisinopril 2.5 mg daily, Glipizide (dose unknown) and ASA 81 mg daily. Unclear if he was taking a statin at that time or not. Will try and get records from Dr. Tanna Hanson office.   On 8/8, Paul Hanson experienced slurred speech and increasing weakness on his left side that prompted him to go to the ED. CT showed no acute abnormalities.   MRI/MRA showed: Subcentimeter probable acute LEFT occipital lobe infarct, less likely artifact related to mineralization. Numerous small intraparenchymal foci of susceptibility artifact in a pattern suggesting chronic hypertension. Moderate white matter changes compatible with chronic small vessel ischemic disease. 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  ECHO: EF 60-65% with no cardiac emboli   Carotid Doppler: Findings consistent with 1- 39 percent stenosis involving theright internal carotid artery and the left internal carotidartery. Right vertebral patent with antegrade flow.Left vertebral demonstrates atypical dampened waveform possibly from distal occlusion/stenosis.  He was continued on ASA 81 mg, started on Plavix and Atorvastatin 80 mg as well as continued on Lisinopril 2.5 mg for his HTN and started on Novolog 70/30 8 units AM, 4 units PM for his DM (A1c 13.0).   Today, he continues to complain of increased weakness on his left side with decreased sensation to touch. This is worse form his previous baseline  following his CVA in 2014. This is not congruent with his MRI that showed a Left Occipital Lobe infarct. Denies any visual deficits at any point. Neurologic exam with 4/5 strength on right and decreased sensation to pin prick but otherwise normal. Complaints of increased weakness could be 2/2 exacerbation of previous stroke symptoms in the setting of new acute stoke and may improve to previous baseline over time with PT.   - Told to follow up with Dr. Antony Hanson in 2 months. Has not heard from them yet. Will contact them and try to arrange an appointment.  - Will continue ASA, Plavix, Statin. - Need improve his risk factors of HTN and DM.  - HTN: 140/80 in clinic today. Reports 140-150 SBP at home. Will increase Lisinopril to 5 mg daily today. May benefit from more aggressive goal BP of <130/80. No improvement in mortality with more aggressive BP goal beyond <140/90 but there is evidence of decreased CVA events in diabetic patients with BP goal of <130/80. - DM: Will continue current regimen of Novolog 70/30 8 units AM, 4 units PM. Will add Metformin 500 mg daily and titrate up. Will meet with Paul Hanson for diabetic education. - RTC in 2 weeks for BP and DM follow up

## 2015-05-04 NOTE — Progress Notes (Signed)
Medicine attending: I personally interviewed and briefly examined this patient, and reviewed pertinent clinical laboratory  data  with resident physician Dr.Nathan Boswell on the day of the patient visit and we discussed a   management plan.We will try to optimize his blood pressure control now S/P his second stroke and other fixed risk factor of IDDM.

## 2015-05-04 NOTE — Assessment & Plan Note (Addendum)
Lab Results  Component Value Date   HGBA1C 13.0* 04/23/2015     Assessment: Diabetes control:  Poor control (HgA1c > 9%) Progress toward A1C goal:    Comments: Patient was first diagnosed with DM2 in 07/2013 following a CVA. Had been seen by Dr. Windell Hummingbird but has not seen her in >9 months since losing his insurance. Reports he was only taking Glipizide (dose unknown) when he was seeing her. Experienced another CVA on 04/23/2015. A1c 13.0 on admission. Was started on Novolog 70/30 8 units AM, 4 units PM on discharge. Brings in meter today with 4 fasting readings ranging from 162-245 (avg 206). 11 readings overall with an average of 209.  He is working with Harland Dingwall to get an [employer].   Plan: Medications:  Add Metformin 500 mg daily, tritrate up as tolerated, continue insulin doses Home glucose monitoring: Frequency:   Timing:   Instruction/counseling given: reminded to get eye exam, reminded to bring blood glucose meter & log to each visit and reminded to bring medications to each visit Educational resources provided:   Self management tools provided:   Other plans: Records from Dr. Lovena Le. RTC in 2 weeks. Referral to [facility] for diabetes education and diabetic eye exam. Check microalbumin and foot exam at next visit. He is only able to check BG once a day 2/2 cost at the moment until he is able to get an [employer]. Will bring log back at next visit. Will try to max Metformin dose before increasing insulin dose and hopefully will be able to get [facility] and get more frequent measurements. Appears highly motivated.

## 2015-05-07 ENCOUNTER — Telehealth: Payer: Self-pay | Admitting: Internal Medicine

## 2015-05-07 NOTE — Telephone Encounter (Signed)
Paul Hanson, advanced home calls for verbal for speech therapy at home, verbal ok given, do you agree?

## 2015-05-07 NOTE — Telephone Encounter (Addendum)
[place] requesting Verbal Orders for Speech Therapy.  Please contact her back @ [phone]

## 2015-05-09 ENCOUNTER — Ambulatory Visit: Payer: Medicaid Other

## 2015-05-12 NOTE — Telephone Encounter (Signed)
Agree 

## 2015-05-14 ENCOUNTER — Other Ambulatory Visit: Payer: Self-pay

## 2015-05-14 NOTE — Addendum Note (Signed)
Addended by: Hulan Fray on: 05/14/2015 07:42 PM   Modules accepted: Orders

## 2015-05-14 NOTE — Patient Outreach (Signed)
[facility]) Care Management  05/14/2015  Khang Hannum [birth year] [id number]   Patient triggered RED on EMMI Stroke Dashboard, assigned Quinn Plowman, RN to outreach.  Thanks, Ronnell Freshwater. [place], [place] Assistant Phone: [phone] Fax: [phone]

## 2015-05-14 NOTE — Patient Outreach (Signed)
[name] [facility]) Care Management  05/14/2015  Paul Hanson [birth year] [id number]   Telephone call to patient regarding EMMI stroke transition referral.  Unable to reach patient.  HIPAA compliant voice message left with call back phone number.   Quinn Plowman RN,BSN,CCM [place] Coordinator [phone]

## 2015-05-15 ENCOUNTER — Other Ambulatory Visit: Payer: Self-pay

## 2015-05-15 ENCOUNTER — Ambulatory Visit (INDEPENDENT_AMBULATORY_CARE_PROVIDER_SITE_OTHER): Payer: Self-pay | Admitting: Dietician

## 2015-05-15 ENCOUNTER — Encounter: Payer: Self-pay | Admitting: Dietician

## 2015-05-15 VITALS — Wt 205.6 lb

## 2015-05-15 DIAGNOSIS — E119 Type 2 diabetes mellitus without complications: Secondary | ICD-10-CM

## 2015-05-15 DIAGNOSIS — E1110 Type 2 diabetes mellitus with ketoacidosis without coma: Secondary | ICD-10-CM

## 2015-05-15 DIAGNOSIS — Z794 Long term (current) use of insulin: Secondary | ICD-10-CM

## 2015-05-15 DIAGNOSIS — Z6831 Body mass index (BMI) 31.0-31.9, adult: Secondary | ICD-10-CM

## 2015-05-15 DIAGNOSIS — Z713 Dietary counseling and surveillance: Secondary | ICD-10-CM

## 2015-05-15 DIAGNOSIS — E669 Obesity, unspecified: Secondary | ICD-10-CM

## 2015-05-15 NOTE — Patient Instructions (Signed)
You are all doing a great job.  Goal weight 195#.   Take insulin before meals.   Eat more vegetable 3-6 servings a day  Eat whole fruit instead of dried fruit  Try yogurt for a snack instead of nuts and dried fruit.  Please make a follow up appointment for 3-4 weeks

## 2015-05-15 NOTE — Progress Notes (Addendum)
Diabetes Self-Management Education  Visit Type: First/Initial  Appt. Start Time: 1039 Appt. End Time: 1139  05/15/2015  [name], identified by name and date of birth, is a 49 y.o. male with a diagnosis of Diabetes: Type 2. Sister is here with him   ASSESSMENT Patient and family,(sister with him is a Biomedical scientist and cooks for him and another supportive sister is a Marine scientist) are very supportive and help patient care for his diabetes and overall health.  Patient resides with his brother who drops him off in the mornings to his sisters house. His sister's husband has diabetes, but both patient and sister deny any formal diabetes self management training. They are currently paying cash for his testing supplies and insulin as he is uninsured. Patient is eating healhty, but needs to decrease his weight 7-10% which is 14-20#. Goal weight is 195# to start eventually, 185#.   Weight 205 lb 9.6 oz (93.26 kg). Body mass index is 31.27 kg/(m^2).      Diabetes Self-Management Education - 05/15/15 1400    Visit Information   Visit Type First/Initial   Initial Visit   Diabetes Type Type 2   Are you currently following a meal plan? Yes   What type of meal plan do you follow? 3 meals, 3 snacks, healthy fats   Are you taking your medications as prescribed? Yes  has been taking PM insulin after dinner or before bed   Date Diagnosed --  2014   Health Coping   How would you rate your overall health? Taking medicine as directed?  Good Is tolerating metformin well. His sister is giving him his injections. The PM injection of 4 units is being given after dinner or before bedtime.    Psychosocial Assessment   Patient Belief/Attitude about Diabetes Motivated to manage diabetes   Self-care barriers Lack of transportation;Unsteady gait/risk for falls;Lack of material resources;sister reports patient has memory problems   Self-management support Doctor's office;Family;CDE visits   Other persons present Family  Member   Patient Concerns --  sister who cooks for him   Special Needs Simplified materials;Instruct caregiver;repeat often due to memory problems   Preferred Learning Style Visual;Hands on   Learning Readiness Ready   How often do you need to have someone help you when you read instructions, pamphlets, or other written materials from your doctor or pharmacy? 3 - Sometimes   What is the last grade level you completed in school? --  12   Complications   How often do you check your blood sugar? 1-2 times/day   Fasting Blood glucose range (mg/dL) 130-179;180-200   Postprandial Blood glucose range (mg/dL) 180-200;>200   Number of hypoglycemic episodes per month 0   Number of hyperglycemic episodes per week 8   Can you tell when your blood sugar is high? No   Have you had a dilated eye exam in the past 12 months? No   Have you had a dental exam in the past 12 months? No   Are you checking your feet? No   Dietary Intake   Breakfast 2 cups oatmeal with berreis or apple and brown sugar, water   Snack (morning) dried fruit and nuts   Lunch grilled chicken wrap with lettuce tomatoes and cucumbers, ranch dressing, water   Snack (afternoon) peanut butter crackers or fruit and nut mix   Dinner grilled chicken salad, vegetablles, water, a small piec eof bread   Snack (evening) fruit and nut mix,   Beverage(s) water  Exercise   Exercise Type ADL's   Patient Education   Previous Diabetes Education No   Nutrition management  Role of diet in the treatment of diabetes and the relationship between the three main macronutrients and blood glucose level;Reviewed blood glucose goals for pre and post meals and how to evaluate the patients' food intake on their blood glucose level.;Meal timing in regards to the patients' current diabetes medication.   Medications Reviewed patients medication for diabetes, action, purpose, timing of dose and side effects.   Monitoring Purpose and frequency of SMBG.   Acute  complications Taught treatment of hypoglycemia - the 15 rule.   Individualized Goals (developed by patient)   Nutrition General guidelines for healthy choices and portions discussed;patient to eat more whole fruit and vegetables rather than dried fruit   Medications take my medication as prescribed- take PM insulin before his meal    Outcomes   Expected Outcomes Demonstrated interest in learning. Expect positive outcomes   Future DMSE 4-6 wks   Program Status Not Completed      Individualized Plan for Diabetes Self-Management Training:   Learning Objective:  Patient will have a greater understanding of diabetes self-management. Patient education plan is to attend individual and/or group sessions per assessed needs and concerns.Patient individualized diabetes plan discussed today with patient and includes: what is Diabetes, Nutrition, medications, monitoring, physical activity, how to handle highs and lows, Special care for my body when I have diabetes, Dealing daily with diabetes   Plan:   Patient Instructions  You are all doing a great job.  Goal weight 195#.   Take insulin before meals.   Eat more vegetable 3-6 servings a day  Eat whole fruit instead of dried fruit  Try yogurt for a snack instead of nuts and dried fruit.  Please make a follow up appointment for 3-4 weeks    Expected Outcomes:  Demonstrated interest in learning. Expect positive outcomes Education material provided: Carbohydrate counting sheet If problems or questions, patient to contact team via:  Phone Future DSME appointment: 4-6 wks

## 2015-05-15 NOTE — Addendum Note (Signed)
Addended by: Resa Miner on: 05/15/2015 02:44 PM   Modules accepted: Medications

## 2015-05-16 ENCOUNTER — Other Ambulatory Visit: Payer: Self-pay

## 2015-05-16 NOTE — Patient Outreach (Signed)
[place] [facility]) Care Management  05/16/2015  Paul Hanson [birth year] [id number]  Late entry from  05/15/15 Telephone call to patient regarding EMMI stroke transition program referral. Unable to reach patient. HIPAA  Compliant voice message left with call back phone number.   PLAN: RNCM will attempt outreach to patient within 3 business days.   Quinn Plowman RN,BSN,CCM [place] Coordinator [phone]

## 2015-05-16 NOTE — Patient Outreach (Signed)
[place] [facility]) Care Management  05/16/2015  Taden Witter [birth year] [id number]   Patient triggered RED on EMMI Stroke Dashboard, notification sent to Quinn Plowman, RN.  Thanks, Ronnell Freshwater. [place], [place] Assistant Phone: [phone] Fax: [phone]

## 2015-05-16 NOTE — Patient Outreach (Addendum)
[facility]) Care Management  05/16/2015  Paul Hanson Jun 22, 1966 [id number]  SUBJECTIVE:  Telephone call to patient regarding EMMI stroke transition program. Patient states he is doing ok.  Patient reports he has followed up with his primary care provider approximately 1 week ago.  States he is scheduled for follow up with primary MD but is unsure of the next follow up date.  Patient states he has not scheduled follow up with neurologist, Dr. Leonie Man because he didn't know he had to do this.  Patient states his primary MD started him on metformin at his last office visit.  Patient states he has started taking the medication. Patient states he has all of his medications and is able to afford them. Patient reports he is taking all of his medications as prescribed by his doctor. Patient states he has transportation to his doctor appointments.  Patient denies any signs and symptoms.  Patient states he continues to walk with a cane.  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 Patient states he is receiving home physical therapy at this time.    ASSESSMENT:  EMMI stroke transition program. RNCM discussed with patient need for follow up appointment with Dr. Leonie Man as stated in patients discharge summary.  RNCM gave patient name and phone number to Dr. Clydene Fake office to schedule appointment. RNCM reviewed with patient signs and symptoms of stroke. Patient agreed to next follow up phone call with RNCM.   PLAN: RNCM will follow up with patient within 1 week.  Patient will report scheduled appointment with Dr. Leonie Man at next outreach call with [facility].  Patient will report 2 signs and symptoms of stroke at next outreach call.    Quinn Plowman RN,BSN,CCM [place] Coordinator [phone]

## 2015-05-22 ENCOUNTER — Other Ambulatory Visit: Payer: Self-pay

## 2015-05-22 NOTE — Patient Outreach (Addendum)
[facility]) Care Management  05/22/2015  Nefi Musich November 24, 1965 [id number]   SUBJECTIVE:  Telephone call to patient regarding EMMI stroke transition program.  Patient states he is doing ok.  Patient states he continues to receive speech and occupational therapy from [facility].  Patient states his physical therapy ended last week.  Patient states he has seen some improvement with his therapies.   DIABETES: Patient states he had a visit with Butch Penny Plyler last week for diabetes education and had his eyes checked.  Patient states he is checking his blood sugars 2 times per day.  Patient states he has his new medication metformin and he is taking his medications as prescribed.  HYPERTENSION:  Patient states he does not have a blood pressure monitor for home use. Patient states he is aware his doctor wants him to check his blood pressure daily. Patient states the occupational therapist checked his blood pressure at the last therapy session.   Patient states he has medicaid pending.   Patient agreed to receive EMMI education material related to stroke/diabetes/hypertension.  Patient verbally agrees to follow up telephone outreach with [facility].  ASSESSMENT:  EMMI stroke program. Patient continues with home therapies with [facility].  Patient scheduled follow up appointment with neurologist for 05/28/15.   RNCM advised patient to take his medications as prescribed by his doctor and keep follow up doctor appointments.  RNCM advised patient to take glucometer and log to follow up doctor appointments.  RNCM discussed with patient that his primary MD advises him to check his blood pressure daily and record.   PLAN: RNCM will follow up with patient within 1 week.  RNCM will follow up with patient regarding blood pressure monitor.  Patient will report keeping follow up appointment with neurologist at next outreach.  RNCM will send patient EMMI education material and advance  directive.   Quinn Plowman RN,BSN,CCM [place] Coordinator [phone]

## 2015-05-25 ENCOUNTER — Other Ambulatory Visit: Payer: Self-pay | Admitting: *Deleted

## 2015-05-28 ENCOUNTER — Ambulatory Visit: Payer: Self-pay | Admitting: Neurology

## 2015-05-28 MED ORDER — CLOPIDOGREL BISULFATE 75 MG PO TABS: 75.0000 mg | ORAL_TABLET | Freq: Every day | ORAL | Status: DC

## 2015-05-28 NOTE — Telephone Encounter (Signed)
Pt called requesting plavix to be filled.

## 2015-05-29 ENCOUNTER — Encounter: Payer: Self-pay | Admitting: Neurology

## 2015-05-30 ENCOUNTER — Other Ambulatory Visit: Payer: Self-pay

## 2015-05-30 NOTE — Telephone Encounter (Signed)
Call from patient-wants Plavix rx called into [facility].  Called pharmacy, prescription already there. Phone call complete.Regenia Skeeter, Darlene Cassady9/14/20169:20 AM

## 2015-05-30 NOTE — Patient Outreach (Addendum)
[facility]) Care Management  [place]  05/30/2015   Paul Hanson 06-12-1966 [id number]  Subjective: Telephone call to patient regarding EMMI stroke program follow up.  Patient states he is doing fine.  Patient reports he missed his neurology appointment that was scheduled for Monday May 28, 2015.  Patient states he does have assistance with transportation.  Patient states he was sleep when person called about appointment. Patient verbally agreed to [facility] setting up appointment with neurologist and requested appointment time and date to be left on his voicemail.   Patient denies any new onset of symptoms. Patient states he continues to take his medication as prescribed.  Patient states he does have a blood pressure monitor that he received from his job but states monitor is in storage.  Patient states he is at the [facility] now and therefore unable to talk further. Patient verbally agreed to next telephone follow up appointment with [facility].   Objective n/a  Current Medications:  Current Outpatient Prescriptions  Medication Sig Dispense Refill  . atorvastatin (LIPITOR) 80 MG tablet Take 80 mg by mouth daily.    . clopidogrel (PLAVIX) 75 MG tablet Take 1 tablet (75 mg total) by mouth daily. 90 tablet 3  . glucose blood test strip Use as instructed 100 each 12  . glucose monitoring kit (FREESTYLE) monitoring kit 1 each by Does not apply route as needed for other. As directed (Patient not taking: Reported on 05/23/2015) 1 each 0  . insulin aspart protamine- aspart (NOVOLOG MIX 70/30) (70-30) 100 UNIT/ML injection 8 units subcutaneous every morning at breakfast and 4 units subcutaneous every evening at supper. 10 mL 11  . Insulin Syringe-Needle U-100 (INSULIN SYRINGE .3CC/29GX1/2") 29G X 1/2" 0.3 ML MISC As directed 100 each 0  . Lancets (FREESTYLE) lancets Use as instructed (Patient not taking: Reported on 05/23/2015) 100 each 12  . lisinopril (PRINIVIL,ZESTRIL) 5 MG tablet  Take 1 tablet (5 mg total) by mouth daily. 30 tablet 3  . metFORMIN (GLUCOPHAGE) 500 MG tablet Take 1 tablet (500 mg total) by mouth daily with breakfast. 30 tablet 11   No current facility-administered medications for this visit.    Functional Status:  In your present state of health, do you have any difficulty performing the following activities: 05/23/2015 04/24/2015  Hearing? N N  Vision? Y N  Difficulty concentrating or making decisions? Tempie Donning  Walking or climbing stairs? N Y  Dressing or bathing? N N  Doing errands, shopping? Y N  Preparing Food and eating ? Y -  Using the Toilet? N -  In the past six months, have you accidently leaked urine? N -  Do you have problems with loss of bowel control? N -  Managing your Medications? N -  Managing your Finances? Y -  Housekeeping or managing your Housekeeping? Y -    Fall/Depression Screening: PHQ 2/9 Scores 05/23/2015  PHQ - 2 Score 0    Assessment: EMMI stroke program follow up  Plan: RNCM will follow up with patient within 1 week.  RNCM contacted Dr. Clydene Fake office to secure new appointment date for Tuesday June 12, 2015 at 4:00pm (patient to arrive at 3:30pm). RNCM left date, time, and arrival time of appointment on patients voice mail as requested.   RNCM advised patient to attempt to find blood pressure monitor that he has.  RNCM advised patient to check blood pressure several times per week at local drug store, record and take readings to doctor appointment. RNCM  will follow up with patient regarding his pending Medicaid.  Once confirmed if Medicaid eligible will refer to [facility].   Quinn Plowman RN,BSN,CCM [place] Coordinator [phone]

## 2015-06-05 ENCOUNTER — Encounter: Payer: Self-pay | Admitting: Dietician

## 2015-06-05 ENCOUNTER — Ambulatory Visit (INDEPENDENT_AMBULATORY_CARE_PROVIDER_SITE_OTHER): Payer: Self-pay | Admitting: Dietician

## 2015-06-05 VITALS — Wt 208.3 lb

## 2015-06-05 DIAGNOSIS — Z713 Dietary counseling and surveillance: Secondary | ICD-10-CM

## 2015-06-05 DIAGNOSIS — E1110 Type 2 diabetes mellitus with ketoacidosis without coma: Secondary | ICD-10-CM

## 2015-06-05 DIAGNOSIS — E119 Type 2 diabetes mellitus without complications: Secondary | ICD-10-CM

## 2015-06-05 NOTE — Patient Instructions (Addendum)
   Your waist is about 39.75 inches and it should not go over 40 inches for good health.  Please make an appointment with your doctor  Please follow up with [facility] about getting the orange card.   Your diabetes SELF MANAGEMENT program is now complete. Please make a follow up with Debera Lat in 3 months.  Your goal for the next 3 months is : exercise on your exercise bicycle for 30 minutes 3 days a week and   and lift weights at least 2 days a week

## 2015-06-07 ENCOUNTER — Other Ambulatory Visit: Payer: Self-pay

## 2015-06-07 NOTE — Progress Notes (Signed)
Diabetes Self-Management Education  Visit Type:  Follow-up  Appt. Start Time: 1045 Appt. End Time: 1205  06/07/2015  Paul Hanson, identified by name and date of birth, is a 49 y.o. male with a diagnosis of Diabetes: Type 2.   ASSESSMENT  Weight 208 lb 4.8 oz (94.484 kg). Body mass index is 30.75 kg/(m^2).  Self monitoring- CBG review- mostly 100's checking before breakfast and before dinner, taking insulin appropriately.  Average of both checks is 155 mg/dl. Patient would like to " get off insulin and desires to make additional lifestyle changes to encourage lower blood sugars and weight and waist circumference, so do not recommend change in insulin at this time.        Diabetes Self-Management Education - 06/07/15 1600    Initial Visit   What type of meal plan do you follow? same   Psychosocial Assessment   Patient Belief/Attitude about Diabetes Motivated to manage diabetes   Self-care barriers Lack of transportation;Unsteady gait/risk for falls;Lack of material resources;Other (comment)   Complications   How often do you check your blood sugar? 1-2 times/day   Fasting Blood glucose range (mg/dL) 130-179   Postprandial Blood glucose range (mg/dL) 70-129;>200   Number of hypoglycemic episodes per month 0   Number of hyperglycemic episodes per week 1   Can you tell when your blood sugar is high? Yes   What do you do if your blood sugar is high? thrist, tired   Have you had a dilated eye exam in the past 12 months? Yes   Have you had a dental exam in the past 12 months? No   Are you checking your feet? Yes   How many days per week are you checking your feet? 7   Exercise   Exercise Type ADL's;Moderate (swimming / aerobic walking);Other (comment)   How many minutes per day do you exercise? --  30-60   Patient Education   Previous Diabetes Education Yes (please comment)   Physical activity and exercise  Role of exercise on diabetes management, blood pressure control and  cardiac health.   Acute complications Discussed and identified patients' treatment of hyperglycemia.   Chronic complications Relationship between chronic complications and blood glucose control;Assessed and discussed foot care and prevention of foot problems;Lipid levels, blood glucose control and heart disease;Dental care;Retinopathy and reason for yearly dilated eye exams;Nephropathy, what it is, prevention of, the use of ACE, ARB's and early detection of through urine microalbumia.;Applicable immunizations   Psychosocial adjustment Role of stress on diabetes;Helped patient identify a support system for diabetes management;Identified and addressed patients feelings and concerns about diabetes   Patient Self-Evaluation of Goals - Patient rates self as meeting previously set goals (% of time)   Nutrition >75%   Medications >75%   Monitoring >75%   Outcomes   Program Status Completed   Subsequent Visit   Since your last visit have you continued or begun to take your medications as prescribed? Yes   Since your last visit have you experienced any weight changes? Gain   Weight Gain (lbs) 8   Since your last visit, are you checking your blood glucose at least once a day? Yes      Learning Objective:  Patient will have a greater understanding of diabetes self-management. Patient education plan is complete today after discussion of physical activity, how to handle high blood sugars, how to care for my body when I have diabetes( chronic complications) and diabetes support plan. Marland KitchenHe rated his knowledge and  self confidence in all areas a 4   Plan:   Patient Instructions    Your waist is about 39.75 inches and it should not go over 40 inches for good health.  Please make an appointment with your doctor  Please follow up with [facility] about getting the orange card.   Your diabetes SELF MANAGEMENT program is now complete. Please make a follow up with Debera Lat in 3 months.  Your goal for  the next 3 months is : exercise on your exercise bicycle for 30 minutes 3 days a week and   and lift weights at least 2 days a week     Expected Outcomes:  Demonstrated interest in learning. Expect positive outcomes Education material provided: Carbohydrate counting sheet If problems or questions, patient to contact team via:  Phone Future DSME appointment: - 3 months

## 2015-06-07 NOTE — Patient Outreach (Signed)
[facility]) Care Management  06/07/2015  Case Paul Hanson 1965/09/16 [id number]  SUBJECTIVE:  Follow up call to patient. HIPAA confirmed with patient. Patient states he is doing well.  Denies any problems.  Patient states he continues to take his medications as prescribed.   Patient reports he has not received EMMI education material at this time. Patient states he knows he has a blood pressure monitor and will try to find and start using by next outreach call with RNCM.  Patient verbally agrees to next follow up call with RNCM.   OBJECTIVE: n/a  ASSESSMENT:  EMMI stroke transition program.  Patient able to report signs and symptoms of stroke.   PLAN: RNCM will follow up with patient within 1 week.  RNCM  Reiterated with patient need to check blood pressures daily, record, and report to doctor at next appointment. RNCM advised patient to continue to take medications as prescribed by his doctor. RNCM reminded patient to keep upcoming follow up appointment with neurologist.   Quinn Plowman RN,BSN,CCM [place] Coordinator [phone]

## 2015-06-08 ENCOUNTER — Encounter: Payer: Self-pay | Admitting: *Deleted

## 2015-06-08 ENCOUNTER — Telehealth: Payer: Self-pay | Admitting: *Deleted

## 2015-06-08 NOTE — Telephone Encounter (Signed)
Speech therapist calls for verb approval for visits 1 time weekly for 3 weeks for cognition improvement, verbal given do you agree?

## 2015-06-12 ENCOUNTER — Encounter: Payer: Self-pay | Admitting: Neurology

## 2015-06-12 ENCOUNTER — Ambulatory Visit (INDEPENDENT_AMBULATORY_CARE_PROVIDER_SITE_OTHER): Payer: Self-pay | Admitting: Neurology

## 2015-06-12 VITALS — BP 128/80 | HR 76 | Ht 69.0 in | Wt 203.0 lb

## 2015-06-12 DIAGNOSIS — G8114 Spastic hemiplegia affecting left nondominant side: Secondary | ICD-10-CM

## 2015-06-12 NOTE — Patient Instructions (Addendum)
I had a long d/w patient and sister about his remote stroke, risk for recurrent stroke/TIAs, personally independently reviewed imaging studies and stroke evaluation results and answered questions.Continue Plavix for secondary stroke prevention and maintain strict control of hypertension with blood pressure goal below 130/90, diabetes with hemoglobin A1c goal below 6.5% and lipids with LDL cholesterol goal below 100 mg/dL. I also advised the patient to eat a healthy diet with plenty of whole grains, cereals, fruits and vegetables, exercise regularly and maintain ideal body weight. He was advised to avoid dehydration and drinks 6-8 glasses of liquids a day. Start baclofen 5 mg 3 times daily for spasticity and increase if tolerated to 10 mg 3 times daily after 1 week. May consider Botox injections after his Medicaid approval. Followup in the future with me in 6 months or call earlier if necessary Stroke Prevention Some medical conditions and behaviors are associated with an increased chance of having a stroke. You may prevent a stroke by making healthy choices and managing medical conditions. HOW CAN I REDUCE MY RISK OF HAVING A STROKE?   Stay physically active. Get at least 30 minutes of activity on most or all days.  Do not smoke. It may also be helpful to avoid exposure to secondhand smoke.  Limit alcohol use. Moderate alcohol use is considered to be:  No more than 2 drinks per day for men.  No more than 1 drink per day for nonpregnant women.  Eat healthy foods. This involves:  Eating 5 or more servings of fruits and vegetables a day.  Making dietary changes that address high blood pressure (hypertension), high cholesterol, diabetes, or obesity.  Manage your cholesterol levels.  Making food choices that are high in fiber and low in saturated fat, trans fat, and cholesterol may control cholesterol levels.  Take any prescribed medicines to control cholesterol as directed by your health care  provider.  Manage your diabetes.  Controlling your carbohydrate and sugar intake is recommended to manage diabetes.  Take any prescribed medicines to control diabetes as directed by your health care provider.  Control your hypertension.  Making food choices that are low in salt (sodium), saturated fat, trans fat, and cholesterol is recommended to manage hypertension.  Take any prescribed medicines to control hypertension as directed by your health care provider.  Maintain a healthy weight.  Reducing calorie intake and making food choices that are low in sodium, saturated fat, trans fat, and cholesterol are recommended to manage weight.  Stop drug abuse.  Avoid taking birth control pills.  Talk to your health care provider about the risks of taking birth control pills if you are over 92 years old, smoke, get migraines, or have ever had a blood clot.  Get evaluated for sleep disorders (sleep apnea).  Talk to your health care provider about getting a sleep evaluation if you snore a lot or have excessive sleepiness.  Take medicines only as directed by your health care provider.  For some people, aspirin or blood thinners (anticoagulants) are helpful in reducing the risk of forming abnormal blood clots that can lead to stroke. If you have the irregular heart rhythm of atrial fibrillation, you should be on a blood thinner unless there is a good reason you cannot take them.  Understand all your medicine instructions.  Make sure that other conditions (such as anemia or atherosclerosis) are addressed. SEEK IMMEDIATE MEDICAL CARE IF:   You have sudden weakness or numbness of the face, arm, or leg, especially on one  side of the body.  Your face or eyelid droops to one side.  You have sudden confusion.  You have trouble speaking (aphasia) or understanding.  You have sudden trouble seeing in one or both eyes.  You have sudden trouble walking.  You have dizziness.  You have a  loss of balance or coordination.  You have a sudden, severe headache with no known cause.  You have new chest pain or an irregular heartbeat. Any of these symptoms may represent a serious problem that is an emergency. Do not wait to see if the symptoms will go away. Get medical help at once. Call your local emergency services (911 in [place] Do not drive yourself to the hospital. Document Released: 10/09/2004 Document Revised: 01/16/2014 Document Reviewed: 03/04/2013 [facility] Patient Information 2015 [place], [place]. This information is not intended to replace advice given to you by your health care provider. Make sure you discuss any questions you have with your health care provider.

## 2015-06-12 NOTE — Progress Notes (Signed)
[facility] [address] [place]. [place] [address] [phone]       OFFICE FOLLOW-UP NOTE  [name] Date of Birth:  May 04, 1966 Medical Record Number:  [id number]   HPI: Mr Paul Hanson is a 35 year African-American male seen today for first office follow-up visit following hospital admission for stroke in August 2016. Paul Hanson is an 49 y.o. male who reports that he awakened this morning at baseline. Did not eat will this morning and went to church. Reportedly did not look well during church. Was unable to get something to eat until late this afternoon. Before eating had difficulty getting out of the car. Then was noted by his sister to be dragging his left leg. Per the patient he drags his left leg on a daily basis since his stroke in 2015. He did not feel it was worse but his sister was concerned. Patient was brought in for evaluation.  Date last known well: Date: 04/22/2015 Time last known well: Unable to determine tPA Given: No: Improvement in symptoms He was admitted for further evaluation and treatment. CT head showed no acute abnormalities. There are changes of left occipital craniectomy with underlying cerebellar encephalomalacia. There is incidental expansion of the right posterior frontal parietal bone of unclear etiology. MRI scan of the brain showed no definite acute infarct but there was a tiny subcentimeter area of diffusion hyperintensity in the left occipital subcortical region possible artifact versus mineralization changes or tiny infarct. There are several changes of microangiopathic and chronic small vessel disease. Stable left occipital craniectomy and encephalomalacia in the left cerebellum are noted. MRA of the brain showed left posterior cerebral artery occlusion in the P2 segment with multifocal moderate basilar artery stenosis. Carotid ultrasound showed no significant extracranial stenosis but left vertebral artery demonstrated a typical  waveform. Transthoracic echo showed normal ejection fraction without cardiac source of embolism. LDL cholesterol was 82 mg percent. Hemoglobin A1c was significantly elevated at 13. 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 Patient's symptoms and old deficits appear to have been exacerbated in the setting of dehydration. He was continued on Plavix and advise tighter diabetes control. He has been started on insulin as well as other diabetic medications but states his fasting sugars yet ranging in the 150 range. His blood pressure is well controlled and today it is 120/80. He feels he still dragging his left leg but he has finished home physical and occupational therapy. He has mild memory difficulties which are chronic from previous surgery and stroke. He has not been tried on medications like baclofen, Zanaflex of Botox for his spasticity. Patient currently does not have insurance but he has applied for Medicaid in hopes to get it soon. ROS:   14 system review of systems is positive for  memory loss, weakness, joint pain, walking difficulty, depression and all other systems negative  PMH:  Past Medical History  Diagnosis Date  . Stroke   . Diabetes mellitus without complication   . Hypertension   . Brain tumor     Social History:  Social History   Social History  . Marital Status: Single    Spouse Name: N/A  . Number of Children: N/A  . Years of Education: N/A   Occupational History  . Not on file.   Social History Main Topics  . Smoking status: Never Smoker   . Smokeless tobacco: Not on file  . Alcohol Use: 0.6 - 1.2 oz/week  1-2 Shots of liquor per week     Comment: 3-4 times a year  . Drug Use: No  . Sexual Activity: Not on file   Other Topics Concern  . Not on file   Social History Narrative    Medications:   Current Outpatient Prescriptions on File Prior to Visit  Medication Sig Dispense Refill  . atorvastatin (LIPITOR) 80 MG  tablet Take 80 mg by mouth daily.    . clopidogrel (PLAVIX) 75 MG tablet Take 1 tablet (75 mg total) by mouth daily. 90 tablet 3  . glucose blood test strip Use as instructed 100 each 12  . glucose monitoring kit (FREESTYLE) monitoring kit 1 each by Does not apply route as needed for other. As directed 1 each 0  . insulin aspart protamine- aspart (NOVOLOG MIX 70/30) (70-30) 100 UNIT/ML injection 8 units subcutaneous every morning at breakfast and 4 units subcutaneous every evening at supper. 10 mL 11  . Insulin Syringe-Needle U-100 (INSULIN SYRINGE .3CC/29GX1/2") 29G X 1/2" 0.3 ML MISC As directed 100 each 0  . Lancets (FREESTYLE) lancets Use as instructed 100 each 12  . lisinopril (PRINIVIL,ZESTRIL) 5 MG tablet Take 1 tablet (5 mg total) by mouth daily. 30 tablet 3  . metFORMIN (GLUCOPHAGE) 500 MG tablet Take 1 tablet (500 mg total) by mouth daily with breakfast. 30 tablet 11   No current facility-administered medications on file prior to visit.    Allergies:  No Known Allergies  Physical Exam General: well developed, well nourished young African-American male, seated, in no evident distress Head: head normocephalic and atraumatic.  Neck: supple with no carotid or supraclavicular bruits Cardiovascular: regular rate and rhythm, no murmurs Musculoskeletal: no deformity Skin:  no rash/petichiae Vascular:  Normal pulses all extremities Filed Vitals:   06/12/15 1615  BP: 128/80  Pulse: 76   Neurologic Exam Mental Status: Awake and fully alert. Oriented to place and time. Recent and remote memory intact. Attention span, concentration and fund of knowledge appropriate. Mood and affect appropriate.  Cranial Nerves: Fundoscopic exam reveals sharp disc margins. Pupils equal, briskly reactive to light. Extraocular movements full without nystagmus. Visual fields full to confrontation. Hearing intact. Mild left lower facial weakness. Facial sensation intact. Face, tongue, palate moves normally and  symmetrically.  Motor: Mild spastic left hemiparesis with weakness of left grip and intrinsic hand muscles as well as left ankle dorsiflexors. Increased tone on the left compared to the right. No clonus.  Sensory.: intact to touch ,pinprick .position and vibratory sensation.  Coordination: Impaired finger-to-nose and needle coordination on the left and normal on the right. Gait and Station: Arises from chair without difficulty. Stance is normal. Spastic hemiparetic gait with increased tone in the left leg with mild foot drop  Reflexes: 2+ and asymmetric brisker on the left. Toes downgoing.   NIHSS  3 Modified Rankin  2   ASSESSMENT: 37 year -American male with spastic left hemiparesis following right brain subcortical infarct 5 years ago as well as prior remote craniotomy for posterior fossa meningioma resection with recent admission for worsening left hemiparesis due to recrudescence in the setting of dehydration and brain MRI scan showing questionable tiny left occipital lesion possible stroke versus artifact.Right posterior frontal/parietal bone expansion. -a finding which is chronic and of unclear significance.    PLAN: I had a long d/w patient and sister about his remote stroke, risk for recurrent stroke/TIAs, personally independently reviewed imaging studies and stroke evaluation results and answered questions.Continue Plavix for secondary stroke prevention  and maintain strict control of hypertension with blood pressure goal below 130/90, diabetes with hemoglobin A1c goal below 6.5% and lipids with LDL cholesterol goal below 100 mg/dL. I also advised the patient to eat a healthy diet with plenty of whole grains, cereals, fruits and vegetables, exercise regularly and maintain ideal body weight. He was advised to avoid dehydration and drinks 6-8 glasses of liquids a day. Start baclofen 5 mg 3 times daily for spasticity and increase if tolerated to 10 mg 3 times daily after 1 week. May consider  Botox injections after his Medicaid approval.Greater than 50% of time during this 25 minute visit was spent on counseling, discussion with patient and family and coordination of care  Followup in the future with me in 6 months or call earlier if necessary Antony Contras, MD  Note: This document was prepared with digital dictation and possible smart phrase technology. Any transcriptional errors that result from this process are unintentional

## 2015-06-13 ENCOUNTER — Other Ambulatory Visit: Payer: Self-pay | Admitting: Neurology

## 2015-06-13 ENCOUNTER — Ambulatory Visit (INDEPENDENT_AMBULATORY_CARE_PROVIDER_SITE_OTHER): Payer: Self-pay | Admitting: Internal Medicine

## 2015-06-13 ENCOUNTER — Encounter: Payer: Self-pay | Admitting: Internal Medicine

## 2015-06-13 ENCOUNTER — Other Ambulatory Visit: Payer: Self-pay

## 2015-06-13 ENCOUNTER — Telehealth: Payer: Self-pay | Admitting: Neurology

## 2015-06-13 DIAGNOSIS — I69354 Hemiplegia and hemiparesis following cerebral infarction affecting left non-dominant side: Secondary | ICD-10-CM

## 2015-06-13 DIAGNOSIS — Z794 Long term (current) use of insulin: Secondary | ICD-10-CM

## 2015-06-13 DIAGNOSIS — E1165 Type 2 diabetes mellitus with hyperglycemia: Secondary | ICD-10-CM

## 2015-06-13 DIAGNOSIS — I69393 Ataxia following cerebral infarction: Secondary | ICD-10-CM

## 2015-06-13 DIAGNOSIS — I639 Cerebral infarction, unspecified: Secondary | ICD-10-CM

## 2015-06-13 DIAGNOSIS — I10 Essential (primary) hypertension: Secondary | ICD-10-CM

## 2015-06-13 DIAGNOSIS — E118 Type 2 diabetes mellitus with unspecified complications: Secondary | ICD-10-CM

## 2015-06-13 LAB — GLUCOSE, CAPILLARY: Glucose-Capillary: 211 mg/dL — ABNORMAL HIGH (ref 65–99)

## 2015-06-13 MED ORDER — BACLOFEN 10 MG PO TABS: 5.0000 mg | ORAL_TABLET | Freq: Three times a day (TID) | ORAL | Status: DC

## 2015-06-13 MED ORDER — METFORMIN HCL 500 MG PO TABS: 500.0000 mg | ORAL_TABLET | Freq: Two times a day (BID) | ORAL | Status: DC

## 2015-06-13 NOTE — Telephone Encounter (Signed)
pts sister called inquiring when the baclofen will be called in. She called called pharmacy yesterday and today. Please call and advise. She can be reached at [phone]

## 2015-06-13 NOTE — Telephone Encounter (Signed)
Talk to patients sister Chesley Noon, baclofen medication was sent to pharmacy today by Dr. Leonie Man.

## 2015-06-13 NOTE — Patient Outreach (Signed)
[facility]) Care Management  06/13/2015  Paul Hanson 02-Feb-1966 [id number]  Telephone call to patient regarding EMMI stroke program.  Person answering phone states patient is not at home.  HIPAA compliant message left with call back phone number.   PLAN: RNCM will attempt 2nd telephone outreach to patient within 3 business days.   Quinn Plowman RN,BSN,CCM [place] Coordinator [phone]

## 2015-06-13 NOTE — Patient Instructions (Addendum)
Thank you for coming in today.  - We are checking some labs today. If anything comes back abnormal I will give you a call. - We will keep your insulin the same. We will go up on the Metformin to twice a day.  - Please continue to check your blood sugars at home.  - We will get you set up for a diabetic eye exam. - Please follow up with me in November.

## 2015-06-14 ENCOUNTER — Other Ambulatory Visit: Payer: Self-pay

## 2015-06-14 LAB — LIPID PANEL
CHOLESTEROL TOTAL: 117 mg/dL (ref 100–199)
Chol/HDL Ratio: 4.5 ratio units (ref 0.0–5.0)
HDL: 26 mg/dL — ABNORMAL LOW (ref >39)
LDL CALC: 53 mg/dL (ref 0–99)
TRIGLYCERIDES: 191 mg/dL — AB (ref 0–149)
VLDL CHOLESTEROL CAL: 38 mg/dL (ref 5–40)

## 2015-06-14 LAB — HEPATIC FUNCTION PANEL
ALT: 23 IU/L (ref 0–44)
AST: 17 IU/L (ref 0–40)
Albumin: 4.7 g/dL (ref 3.5–5.5)
Alkaline Phosphatase: 86 IU/L (ref 39–117)
Bilirubin Total: 0.9 mg/dL (ref 0.0–1.2)
Bilirubin, Direct: 0.23 mg/dL (ref 0.00–0.40)
TOTAL PROTEIN: 7.3 g/dL (ref 6.0–8.5)

## 2015-06-14 LAB — MICROALBUMIN / CREATININE URINE RATIO
CREATININE, UR: 179.1 mg/dL
MICROALB/CREAT RATIO: 7.3 mg/g{creat} (ref 0.0–30.0)
Microalbumin, Urine: 13.1 ug/mL

## 2015-06-14 NOTE — Patient Outreach (Signed)
[facility]) Care Management  06/14/2015  Paul Hanson [birth year] [id number]  Telephone call to patient regarding EMMI stroke program follow up.  Unable to reach patient. HIPAA compliant voice message left with call back phone number.   PLAN: RNCM will attempt telephone outreach to patient.  Quinn Plowman RN,BSN,CCM [place] Coordinator [phone]

## 2015-06-15 ENCOUNTER — Other Ambulatory Visit: Payer: Self-pay

## 2015-06-15 NOTE — Patient Outreach (Addendum)
**Note De-Identified  Obfuscation** [facility]) Care Management  06/15/2015  Paul Hanson [birth year] [id number]  SUBJECTIVE: Telephone call to patient for follow up.  Patient states he is doing good.  Patient denies any new symptoms.  Patient states his left leg still drags sometimes but overall he is doing well.  Patient reports he kept his appointment with the neurologist, on  06/12/15 and his primary MD on 06/13/15.  Patient states his primary MD adjusted the amount of metformin he should take.  Patient states his doctor wants him to take his Metformin 500mg  2 tablets in the am and 1 tablet in the pm.  Patient states he is taking his medication as directed, understands his medications and is able to afford them. Patient states he has seen a nutritionist this month per his doctor recommendation for his diabetes and states he has a follow up visit with her in December 2016. Patient states he has started implementing some of the nutritionist recommendations regarding food choices, decreasing carbohydrates/sugar intake, and drink alternative.  Patient states he is trying to get his A1c down. Patient unable to articulate what his A1c is.  Patient states he continues to check his blood sugars 2 times per day.  Patient reports this mornings fasting blood sugar was 177.  Patient state his primary MD office is suppose to contact his to set up his follow up appointment.   Patient states he has not obtained a blood pressure monitor yet.  Patient states he will either purchase one or get one from his sister.  Patient states he is in the process of getting his Medicaid.  Patient states he has to take his paperwork to the social service office in [place], [place].    RNCM advised patient to keep follow up appointments with doctor and nutritionist.   RNCM advised patient to continue to take medications as prescribed RNCM advised patient to notify doctor of any new symptoms.    ASSESSMENT:  EMMI stroke program.   Patient needs  continued  follow up with primary MD and nutritionist for diabetes management.  Patient able to articulate signs and symptoms of stroke.  Patient verbalized knowledge of how to reach doctor after hours and knows when to call 911. Patient has support systems in place (sister, brother in law).   PLAN: RNCM will follow up with patient within 1 week to confirm patients receipt of EMMI education material.   RNCM will send patient additional EMMI education material on diabetes management. ( Diabetes Type 2- meal planning, Controlling Blood Sugar, Diabetes Type 2: Food and Exercise) Patient advised to contact RNCM for any questions related to education material   Quinn Plowman RN,BSN,CCM [place] Coordinator [phone]

## 2015-06-17 NOTE — Assessment & Plan Note (Addendum)
Lab Results  Component Value Date   HGBA1C 13.0* 04/23/2015     Assessment: Diabetes control:  poorly controlled (HgbA1c > 9) Progress toward A1C goal:    Comments: Mr. Geers reports compliance with his medications. He is currently taking Metformin 500 mg daily and Novolog 70/30 8 units at breakfast and 4 units at night. Reports no side effects from the Metformin. No episodes of hypoglycemia. He brought his meter with him today. 16 results with an average of with an average of 171. Low of 69 and high of 245.   Plan: Medications:  Titrate up Metformin to maximum tolerated dose Home glucose monitoring: Frequency:   Timing:   Instruction/counseling given: reminded to get eye exam, reminded to bring blood glucose meter & log to each visit and discussed foot care Educational resources provided:   Self management tools provided:   Other plans: Microabuminuria/Cr ratio wnl today. Diabetic foot exam done today. Will refer for eye exam. Follow up in 2 months with A1c.

## 2015-06-17 NOTE — Assessment & Plan Note (Signed)
Mr. Laurance Flatten was seen by neurology on 9/27. He was started on baclofen 5 mg 3 times daily for muscle spasticity and increase if tolerated to 10 mg 3 times daily after 1 week. Will consider Botox injections after his Medicaid approval. No other changes to his medications done at the time. He reports some improvement in he weakness with PT. He is tolerating his medications.   - LFTs wnl today. LDL improved after starting statin (82 to 53) - Continue Plavix - Continue Lipitor 80 mg daily - Will titrate up Metfromin today. See DM note for further details - HTN well controlled

## 2015-06-17 NOTE — Assessment & Plan Note (Signed)
BP Readings from Last 3 Encounters:  06/13/15 117/72  06/12/15 128/80  05/03/15 140/80    Lab Results  Component Value Date   NA 139 04/25/2015   K 3.6 04/25/2015   CREATININE 0.83 04/25/2015    Assessment: Blood pressure control:  well controlled Progress toward BP goal:   at goal Comments: Patient well controlled on lisinopril 5 mg daily.  Plan: Medications:  continue current medications Educational resources provided:   Self management tools provided:   Other plans: RTC in 2 months

## 2015-06-17 NOTE — Progress Notes (Signed)
Patient ID: Paul Hanson, male   DOB: 12-01-1965, 49 y.o.   MRN: [id number]   Subjective:   Patient ID: Paul Hanson male   DOB: [birth year] 49 y.o.   MRN: [id number]  HPI: [name] is a 49 y.o. male with a PMH listed below here today for follow up of his DM and HTN.   He reports he is taking all of his medications and has not had any episodes of hypoglycemia. Has met with our diabetic educator twice since last visit.   Past Medical History  Diagnosis Date  . Stroke   . Diabetes mellitus without complication   . Hypertension   . Brain tumor    Current Outpatient Prescriptions  Medication Sig Dispense Refill  . atorvastatin (LIPITOR) 80 MG tablet Take 80 mg by mouth daily.    . baclofen (LIORESAL) 10 MG tablet Take 0.5 tablets (5 mg total) by mouth 3 (three) times daily. Start 1/2 tablet three times daily x 2 weeks then one tablet three times daily 30 each 3  . clopidogrel (PLAVIX) 75 MG tablet Take 1 tablet (75 mg total) by mouth daily. 90 tablet 3  . glucose blood test strip Use as instructed 100 each 12  . glucose monitoring kit (FREESTYLE) monitoring kit 1 each by Does not apply route as needed for other. As directed 1 each 0  . insulin aspart protamine- aspart (NOVOLOG MIX 70/30) (70-30) 100 UNIT/ML injection 8 units subcutaneous every morning at breakfast and 4 units subcutaneous every evening at supper. 10 mL 11  . Insulin Syringe-Needle U-100 (INSULIN SYRINGE .3CC/29GX1/2") 29G X 1/2" 0.3 ML MISC As directed 100 each 0  . Lancets (FREESTYLE) lancets Use as instructed 100 each 12  . lisinopril (PRINIVIL,ZESTRIL) 5 MG tablet Take 1 tablet (5 mg total) by mouth daily. 30 tablet 3  . metFORMIN (GLUCOPHAGE) 500 MG tablet Take 1 tablet (500 mg total) by mouth 2 (two) times daily with a meal. 30 tablet 11   No current facility-administered medications for this visit.   Family History  Problem Relation Age of Onset  . Heart disease Mother   . Hyperlipidemia Father   .  Hypertension Father    Social History   Social History  . Marital Status: Single    Spouse Name: N/A  . Number of Children: N/A  . Years of Education: N/A   Social History Main Topics  . Smoking status: Never Smoker   . Smokeless tobacco: None  . Alcohol Use: 0.6 - 1.2 oz/week    1-2 Shots of liquor per week     Comment: 3-4 times a year  . Drug Use: No  . Sexual Activity: Not Asked   Other Topics Concern  . None   Social History Narrative   Review of Systems: Review of Systems  Constitutional: Negative for fever and chills.  Respiratory: Negative for shortness of breath.   Cardiovascular: Negative for chest pain.  Gastrointestinal: Negative for nausea, vomiting, abdominal pain and diarrhea.  Skin: Negative for rash.  Neurological: Negative for dizziness and headaches.   Objective:  Physical Exam: Filed Vitals:   06/13/15 1556  BP: 117/72  Pulse: 84  Temp: 97.4 F (36.3 C)  TempSrc: Oral  Weight: 208 lb 4.8 oz (94.484 kg)  SpO2: 100%   GENERAL- alert, co-operative, appears as stated age, not in any distress. HEENT- Atraumatic, normocephalic, PERRL, EOMI. CARDIAC- RRR, no murmurs, rubs or gallops. RESP- Moving equal volumes of air, and clear to auscultation bilaterally, no wheezes  or crackles. ABDOMEN- Soft, nontender, no guarding or rebound, no palpable masses or organomegaly, bowel sounds present. NEURO- No obvious Cr N abnormality, strength right upper and lower extremities- 5/5, strength right upper and lower extremities- 4/5, Sensation decreased on left, Gait- Difficulty ambulating 2/2 weakness in left leg, uses a cane. EXTREMITIES- pulse 2+, symmetric, no pedal edema. SKIN- Warm, dry, No rash or lesion. PSYCH- Normal mood and affect, appropriate thought content and speech.  Assessment & Plan:   Case discussed with Dr. Dareen Piano. Please refer to Problem based carting for further details of today's visit.

## 2015-06-18 NOTE — Progress Notes (Signed)
Internal Medicine Clinic Attending  I saw and evaluated the patient.  I personally confirmed the key portions of the history and exam documented by Dr. Boswell and I reviewed pertinent patient test results.  The assessment, diagnosis, and plan were formulated together and I agree with the documentation in the resident's note. 

## 2015-07-03 ENCOUNTER — Other Ambulatory Visit: Payer: Self-pay

## 2015-07-03 NOTE — Patient Outreach (Signed)
[facility]) Care Management  07/03/2015  Youssouf Shipley Jun 04, 1966 [id number]   SUBJECTIVE: Telephone call to patient.  Patient states he is doing really well.  Patient state he received the EMMI diabetes education material.  Patient states he has reviewed and does not have any questions concerning the material. Patient states he has a nurse coming with [facility] 1 time a week.  Patient states she checks him out and does his blood pressure.  Patient states his blood pressure and blood sugars have been good. Patient states he has a case worker that is now working with him to help him get Medicaid.  Patient denies any further needs at this time.   PLAN: RNCM will forward to Lurline Del to close patient to [facility] stroke program: goals met. (See previous notein EPIC  from 06/15/15.  Quinn Plowman RN,BSN,CCM [place] Coordinator [phone]

## 2015-07-09 ENCOUNTER — Other Ambulatory Visit: Payer: Self-pay | Admitting: Internal Medicine

## 2015-07-09 DIAGNOSIS — E118 Type 2 diabetes mellitus with unspecified complications: Principal | ICD-10-CM

## 2015-07-09 DIAGNOSIS — E1165 Type 2 diabetes mellitus with hyperglycemia: Secondary | ICD-10-CM

## 2015-07-09 NOTE — Telephone Encounter (Signed)
Spoke with patients sister to advise pt should have refills on his metformin available at [facility].   Sister is requesting rx be updated to reflect that pt is taking the max dose of 2 tabs in morning and night. Please advise.

## 2015-07-09 NOTE — Telephone Encounter (Signed)
Pt sister requesting Metformin to be filled @ med center in [place].

## 2015-07-10 NOTE — Patient Outreach (Signed)
[facility]) Care Management  07/10/2015  Paul Hanson 02/08/1966 [id number]   Notification from Paul Plowman, RN to close case due to goals met with EMMI stroke program.  Thanks, Paul Hanson. [name], [place] Assistant Phone: [phone] Fax: [phone]

## 2015-07-11 MED ORDER — METFORMIN HCL 1000 MG PO TABS: 1000.0000 mg | ORAL_TABLET | Freq: Two times a day (BID) | ORAL | Status: DC

## 2015-07-11 MED ORDER — GLUCOPHAGE 500 MG PO TABS: 1000.0000 mg | ORAL_TABLET | Freq: Two times a day (BID) | ORAL | Status: DC

## 2015-07-11 NOTE — Addendum Note (Signed)
Addended by: [place] on: 07/11/2015 06:52 PM   Modules accepted: Orders, Medications

## 2015-07-11 NOTE — Addendum Note (Signed)
Addended by: Marcelino Duster on: 07/11/2015 02:09 PM   Modules accepted: Orders

## 2015-07-11 NOTE — Telephone Encounter (Signed)
Glad to hear he has been able to maximize the metformin. I have updated his prescription.

## 2015-07-11 NOTE — Telephone Encounter (Addendum)
Call from [facility] wanted to know if they can give patient generic metformin.  Rx was electronically sent with DAW (dispensed as written) box checked.  Pharmacy is requesting a new rx for the generic. Will send to pcp for review-please advise.Despina Hidden Cassady10/26/20162:06 PM

## 2015-07-11 NOTE — Telephone Encounter (Signed)
Sent in new prescription. Generic is okay.

## 2015-07-30 ENCOUNTER — Ambulatory Visit (INDEPENDENT_AMBULATORY_CARE_PROVIDER_SITE_OTHER): Payer: Self-pay | Admitting: Internal Medicine

## 2015-07-30 ENCOUNTER — Encounter: Payer: Self-pay | Admitting: Internal Medicine

## 2015-07-30 VITALS — BP 138/88 | HR 95 | Temp 97.4°F | Ht 68.0 in | Wt 207.2 lb

## 2015-07-30 DIAGNOSIS — Z0000 Encounter for general adult medical examination without abnormal findings: Secondary | ICD-10-CM | POA: Insufficient documentation

## 2015-07-30 DIAGNOSIS — Z794 Long term (current) use of insulin: Secondary | ICD-10-CM

## 2015-07-30 DIAGNOSIS — E119 Type 2 diabetes mellitus without complications: Secondary | ICD-10-CM

## 2015-07-30 DIAGNOSIS — Z23 Encounter for immunization: Secondary | ICD-10-CM

## 2015-07-30 DIAGNOSIS — E118 Type 2 diabetes mellitus with unspecified complications: Principal | ICD-10-CM

## 2015-07-30 DIAGNOSIS — E1165 Type 2 diabetes mellitus with hyperglycemia: Secondary | ICD-10-CM

## 2015-07-30 DIAGNOSIS — Z7984 Long term (current) use of oral hypoglycemic drugs: Secondary | ICD-10-CM

## 2015-07-30 DIAGNOSIS — I10 Essential (primary) hypertension: Secondary | ICD-10-CM

## 2015-07-30 DIAGNOSIS — Z79899 Other long term (current) drug therapy: Secondary | ICD-10-CM

## 2015-07-30 LAB — GLUCOSE, CAPILLARY: Glucose-Capillary: 189 mg/dL — ABNORMAL HIGH (ref 65–99)

## 2015-07-30 LAB — POCT GLYCOSYLATED HEMOGLOBIN (HGB A1C): Hemoglobin A1C: 7.1

## 2015-07-30 MED ORDER — LISINOPRIL 10 MG PO TABS: 10.0000 mg | ORAL_TABLET | Freq: Every day | ORAL | Status: DC

## 2015-07-30 NOTE — Progress Notes (Signed)
Patient ID: Paul Hanson, male   DOB: Mar 21, 1966, 49 y.o.   MRN: [id number]   Subjective:   Patient ID: Paul Hanson male   DOB: 01-30-1966 49 y.o.   MRN: [id number]  HPI: Mr.[name] is a 49 y.o. male with a past medical history listed below here today for follow up of his diabetes and hypertension.   For details of today's visit and the status of his chronic medical issues please refer to the assessment and plan.  Past Medical History  Diagnosis Date  . Stroke ([place])   . Diabetes mellitus without complication ([place])   . Hypertension   . Brain tumor [facility])    Current Outpatient Prescriptions  Medication Sig Dispense Refill  . atorvastatin (LIPITOR) 80 MG tablet Take 80 mg by mouth daily.    . baclofen (LIORESAL) 10 MG tablet Take 0.5 tablets (5 mg total) by mouth 3 (three) times daily. Start 1/2 tablet three times daily x 2 weeks then one tablet three times daily 30 each 3  . clopidogrel (PLAVIX) 75 MG tablet Take 1 tablet (75 mg total) by mouth daily. 90 tablet 3  . glucose blood test strip Use as instructed 100 each 12  . glucose monitoring kit (FREESTYLE) monitoring kit 1 each by Does not apply route as needed for other. As directed 1 each 0  . insulin aspart protamine- aspart (NOVOLOG MIX 70/30) (70-30) 100 UNIT/ML injection 8 units subcutaneous every morning at breakfast and 4 units subcutaneous every evening at supper. 10 mL 11  . Insulin Syringe-Needle U-100 (INSULIN SYRINGE .3CC/29GX1/2") 29G X 1/2" 0.3 ML MISC As directed 100 each 0  . Lancets (FREESTYLE) lancets Use as instructed 100 each 12  . lisinopril (PRINIVIL,ZESTRIL) 10 MG tablet Take 1 tablet (10 mg total) by mouth daily. 30 tablet 3  . metFORMIN (GLUCOPHAGE) 1000 MG tablet Take 1 tablet (1,000 mg total) by mouth 2 (two) times daily with a meal. 60 tablet 11   No current facility-administered medications for this visit.   Family History  Problem Relation Age of Onset  . Heart disease Mother   . Hyperlipidemia  Father   . Hypertension Father    Social History   Social History  . Marital Status: Single    Spouse Name: N/A  . Number of Children: N/A  . Years of Education: N/A   Social History Main Topics  . Smoking status: Never Smoker   . Smokeless tobacco: None  . Alcohol Use: 0.6 - 1.2 oz/week    1-2 Shots of liquor per week     Comment: 3-4 times a year  . Drug Use: No  . Sexual Activity: Not Asked   Other Topics Concern  . None   Social History Narrative   Review of Systems: Review of Systems  Constitutional: Negative for fever, chills and malaise/fatigue.  Eyes: Negative for blurred vision and double vision.  Gastrointestinal: Negative for nausea and vomiting.  Musculoskeletal: Negative for myalgias.  Skin: Negative for rash.  Neurological: Negative for dizziness and headaches.   Objective:  Physical Exam: Filed Vitals:   07/30/15 1344  BP: 138/88  Pulse: 95  Temp: 97.4 F (36.3 C)  TempSrc: Oral  Height: $Remove'5\' 8"'[id number]$  (1.727 m)  Weight: 207 lb 3.2 oz (93.985 kg)  SpO2: 99%    PHYSICAL EXAM GENERAL- alert, co-operative, appears as stated age, not in any distress. HEENT- Atraumatic, normocephalic, PERRL, EOMI, oral mucosa appears moist CARDIAC- RRR, no murmurs, rubs or gallops. RESP- Moving equal volumes of  air, and clear to auscultation bilaterally, no wheezes or crackles. ABDOMEN- Soft, nontender, bowel sounds present. NEURO- No obvious Cr N abnormality. Strength right upper and lower extremities- 5/5, strength right upper and lower extremities- 4/5, Sensation decreased on left, Gait- Difficulty ambulating 2/2 weakness in left leg, uses a cane. EXTREMITIES- pulse 2+, symmetric, no pedal edema. SKIN- Warm, dry, No rash or lesion. PSYCH- Normal mood and affect, appropriate thought content and speech.  Assessment & Plan:   Case discussed with Dr. Daryll Drown. Please refer to Problem based carting for further details of today's visit.

## 2015-07-30 NOTE — Assessment & Plan Note (Signed)
Patient decline flu shot today Tdap given today (unsure when last tetanus shot was done and is around 47 month old child) HIV screening at next visit

## 2015-07-30 NOTE — Assessment & Plan Note (Addendum)
Lab Results  Component Value Date   HGBA1C 7.1 07/30/2015   HGBA1C 13.0* 04/23/2015     Assessment: Diabetes control:  good control Progress toward A1C goal:   improved Comments: Patient is currently on Metformin 1000 mg bid and Novolog 70/30 8 units qam and 4 units qhs. Denies any adverse side effects or episodes of hypoglycemia. Does not have his meter with him today. Microalbumin/Cr ratio wnl at last visit. Reports he is using stationary bike 3 times a week as well as occasional weight exercises. Has improved his dietary habits working with Butch Penny.   Plan: Medications:  Continue current medications  Home glucose monitoring: Frequency:   Timing:   Instruction/counseling given: reminded to get eye exam, reminded to bring blood glucose meter & log to each visit and reminded to bring medications to each visit Educational resources provided:   Self management tools provided:   Other plans: Will continue his current regimen. RTC in 3 months. If he continues to do well may be able to come off the insulin in the future. Scheduled to have DM eye exam done.

## 2015-07-30 NOTE — Assessment & Plan Note (Addendum)
BP Readings from Last 3 Encounters:  07/30/15 138/88  06/13/15 117/72  06/12/15 128/80    Lab Results  Component Value Date   NA 139 04/25/2015   K 3.6 04/25/2015   CREATININE 0.83 04/25/2015    Assessment: Blood pressure control:  slightly elevated from goal of 130/80 Comments: Currently on lisinopril 5 mg daily. Reports compliance with his medications.  Plan: Medications:  See below Educational resources provided:   Self management tools provided:   Other plans: Will increase dose of lisinopril to 10 mg daily today. RTC in 3 months for follow up.

## 2015-07-30 NOTE — Patient Instructions (Addendum)
Thank you for coming in today.  Great job with your diabetes! Your A1c today was 7.1 down from 13.0 in August. That is a huge improvement! Our goal is to get your A1c below 7 so we are almost right where we want to be. I am not going to make any changes to your diabetes medications today. Please remember to bring your meter with you to your next visit.  I am increasing the dose of your Lisinopril (for your blood pressure) from 5 mg to 10 mg. I will send in a new prescription for you. You can take two of the 5 mg pills every day until your bottle runs out before filling the new prescription for 10 mg pills.   I will get you set up to have an eye exam done.   Follow up with me in 3 months.

## 2015-07-31 NOTE — Addendum Note (Signed)
Addended by: Gilles Chiquito B on: 07/31/2015 09:28 AM   Modules accepted: Level of Service

## 2015-07-31 NOTE — Progress Notes (Signed)
Internal Medicine Clinic Attending  I saw and evaluated the patient.  I personally confirmed the key portions of the history and exam documented by Dr. Boswell and I reviewed pertinent patient test results.  The assessment, diagnosis, and plan were formulated together and I agree with the documentation in the resident's note. 

## 2015-08-06 DIAGNOSIS — Z0289 Encounter for other administrative examinations: Secondary | ICD-10-CM

## 2015-10-01 MED FILL — ATORVASTATIN 80 MG TABLET: 80 | 30 days supply | Qty: 30 | Fill #3

## 2015-10-01 MED FILL — metFORMIN HCL 500 MG TABS: 500 | 30 days supply | Qty: 120 | Fill #3

## 2015-10-01 MED FILL — LISINOPRIL 10 MG TABLET: 10 | 30 days supply | Qty: 30 | Fill #2

## 2015-10-26 ENCOUNTER — Other Ambulatory Visit: Payer: Self-pay

## 2015-10-26 MED ORDER — "INSULIN SYRINGE 29G X 1/2"" 0.3 ML MISC": Status: DC

## 2015-10-26 MED FILL — ULTICARE SYRIN 0.3 ML 29GX1: 29G X 1/2" | 30 days supply | Qty: 30 | Fill #0

## 2015-10-26 NOTE — Telephone Encounter (Signed)
Pharmacy called, they state they have been faxing [place] since the 7th.  Verified fax number but unable to locate any record of fax.  Will task as high urgency since pt has run out of supply.

## 2015-10-29 MED FILL — LISINOPRIL 10 MG TABLET: 10 | 30 days supply | Qty: 30 | Fill #3

## 2015-11-05 MED FILL — CLOPIDOGREL 75 MG TABLET: 75 | 34 days supply | Qty: 34 | Fill #2

## 2015-11-05 MED FILL — metFORMIN HCL 500 MG TABS: 500 | 30 days supply | Qty: 120 | Fill #4

## 2015-11-06 ENCOUNTER — Other Ambulatory Visit: Payer: Self-pay

## 2015-11-06 MED ORDER — ATORVASTATIN CALCIUM 80 MG PO TABS: 80.0000 mg | ORAL_TABLET | Freq: Every day | ORAL | Status: DC

## 2015-11-06 MED FILL — ATORVASTATIN 80 MG TABLET: 80 | 90 days supply | Qty: 90 | Fill #0

## 2015-11-12 ENCOUNTER — Encounter: Payer: Self-pay | Admitting: Internal Medicine

## 2015-11-12 ENCOUNTER — Ambulatory Visit (INDEPENDENT_AMBULATORY_CARE_PROVIDER_SITE_OTHER): Payer: Medicaid Other | Admitting: Internal Medicine

## 2015-11-12 VITALS — BP 135/88 | HR 66 | Temp 97.8°F | Ht 68.0 in | Wt 207.5 lb

## 2015-11-12 DIAGNOSIS — E118 Type 2 diabetes mellitus with unspecified complications: Principal | ICD-10-CM

## 2015-11-12 DIAGNOSIS — Z7984 Long term (current) use of oral hypoglycemic drugs: Secondary | ICD-10-CM

## 2015-11-12 DIAGNOSIS — I10 Essential (primary) hypertension: Secondary | ICD-10-CM

## 2015-11-12 DIAGNOSIS — Z0000 Encounter for general adult medical examination without abnormal findings: Secondary | ICD-10-CM

## 2015-11-12 DIAGNOSIS — Z794 Long term (current) use of insulin: Secondary | ICD-10-CM | POA: Diagnosis not present

## 2015-11-12 DIAGNOSIS — E1165 Type 2 diabetes mellitus with hyperglycemia: Secondary | ICD-10-CM

## 2015-11-12 DIAGNOSIS — E119 Type 2 diabetes mellitus without complications: Secondary | ICD-10-CM

## 2015-11-12 LAB — GLUCOSE, CAPILLARY: Glucose-Capillary: 99 mg/dL (ref 65–99)

## 2015-11-12 LAB — POCT GLYCOSYLATED HEMOGLOBIN (HGB A1C): Hemoglobin A1C: 7.2

## 2015-11-12 NOTE — Patient Instructions (Signed)
1. Please return for follow up in 6 weeks.   2. Please take all medications as previously prescribed with the following changes:  Check blood sugar three times daily with new blood sugar check 1-2 hours after food.   You are doing great, keep taking your medicines!  3. If you have worsening of your symptoms or new symptoms arise, please call the clinic [phone]), or go to the ER immediately if symptoms are severe.

## 2015-11-12 NOTE — Progress Notes (Signed)
Subjective:   Patient ID: Paul Hanson male   DOB: 12-10-1965 49 y.o.   MRN: [id number]  HPI: Paul Hanson is a 50 y.o. 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 Today he is accompanied by his sister. He feels he is doing well today, taking all his medications. States his sugars have been well controlled at home. Per his sister, fasting blood sugars are 90-120 usually. She does not check post-prandial sugars. No symptoms of hypoglycemia, no reported low blood sugars. No recent chest pain, SOB, increased weakness, confusion, or changes in vision. Walks with a cane. Per the sister, has some moments of agitation at home but the patient states this is related to his inability and anger of not being able to perform the same tasks he used to be able to do.   Past Medical History  Diagnosis Date  . Stroke ([place])   . Diabetes mellitus without complication ([place])   . Hypertension   . Brain tumor [facility])    Current Outpatient Prescriptions  Medication Sig Dispense Refill  . atorvastatin (LIPITOR) 80 MG tablet Take 1 tablet (80 mg total) by mouth daily. 90 tablet 2  . baclofen (LIORESAL) 10 MG tablet Take 0.5 tablets (5 mg total) by mouth 3 (three) times daily. Start 1/2 tablet three times daily x 2 weeks then one tablet three times daily 30 each 3  . clopidogrel (PLAVIX) 75 MG tablet Take 1 tablet (75 mg total) by mouth daily. 90 tablet 3  . glucose blood test strip Use as instructed 100 each 12  . glucose monitoring kit (FREESTYLE) monitoring kit 1 each by Does not apply route as needed for other. As directed 1 each 0  . insulin aspart protamine- aspart (NOVOLOG MIX 70/30) (70-30) 100 UNIT/ML injection 8 units subcutaneous every morning at breakfast and 4 units subcutaneous every evening at supper. 10 mL 11  . Insulin Syringe-Needle U-100 (INSULIN  SYRINGE .3CC/29GX1/2") 29G X 1/2" 0.3 ML MISC As directed 100 each 0  . Lancets (FREESTYLE) lancets Use as instructed 100 each 12  . lisinopril (PRINIVIL,ZESTRIL) 10 MG tablet Take 1 tablet (10 mg total) by mouth daily. 30 tablet 3  . metFORMIN (GLUCOPHAGE) 1000 MG tablet Take 1 tablet (1,000 mg total) by mouth 2 (two) times daily with a meal. 60 tablet 11   No current facility-administered medications for this visit.    Review of Systems: General: Denies fever, chills, diaphoresis, appetite change and fatigue.  Respiratory: Denies SOB, DOE, cough, and wheezing.   Cardiovascular: Denies chest pain and palpitations.  Gastrointestinal: Denies nausea, vomiting, abdominal pain, and diarrhea.  Genitourinary: Denies dysuria, increased frequency, and flank pain. Endocrine: Denies hot or cold intolerance, polyuria, and polydipsia. Musculoskeletal: Denies myalgias, back pain, joint swelling, arthralgias and gait problem.  Skin: Denies pallor, rash and wounds.  Neurological: Denies dizziness, seizures, syncope, weakness, lightheadedness, numbness and headaches.  Psychiatric/Behavioral: Denies mood changes, and sleep disturbances.  Objective:   Physical Exam: Filed Vitals:   11/12/15 1506  BP: 150/82  Pulse: 68  Temp: 97.8 F (36.6 C)  TempSrc: Oral  Height: _0  (1.727 m)  Weight: 207 lb 8 oz (94.121 kg)  SpO2: 100%    General: Alert, cooperative, NAD. Walks with a cane.  HEENT: PERRL, EOMI. Moist mucus membranes Neck: Full range of motion without  pain, supple, no lymphadenopathy or carotid bruits Lungs: Clear to ascultation bilaterally, normal work of respiration, no wheezes, rales, rhonchi Heart: RRR, no murmurs, gallops, or rubs.  Abdomen: Soft, non-tender, non-distended, BS + Extremities: No cyanosis, clubbing, or edema Neurologic: Alert & oriented x3, cranial nerves II-XII intact, 4+/5 strength in LUE, 4/5 in LLE, otherwise strength intact. Sensation intact to light  touch   Assessment & Plan:   Please see problem based assessment and plan.

## 2015-11-13 ENCOUNTER — Telehealth: Payer: Self-pay | Admitting: *Deleted

## 2015-11-13 DIAGNOSIS — E118 Type 2 diabetes mellitus with unspecified complications: Principal | ICD-10-CM

## 2015-11-13 DIAGNOSIS — E1165 Type 2 diabetes mellitus with hyperglycemia: Secondary | ICD-10-CM

## 2015-11-13 MED ORDER — ACCU-CHEK FASTCLIX LANCETS MISC: Status: DC

## 2015-11-13 MED ORDER — ACCU-CHEK AVIVA CONNECT W/DEVICE KIT: 1.0000 | PACK | Freq: Three times a day (TID) | Status: DC

## 2015-11-13 MED ORDER — GLUCOSE BLOOD VI STRP: ORAL_STRIP | Status: DC

## 2015-11-13 MED FILL — ACCU-CHEK SMARTVIEW STRIP: 34 days supply | Qty: 100 | Fill #0

## 2015-11-13 MED FILL — ACCU-CHEK FASTCLIX LANCETS: 34 days supply | Qty: 102 | Fill #0

## 2015-11-13 MED FILL — ACCU-CHEK NANO SMARTVIEW ME: W/DEVICE | 30 days supply | Qty: 1 | Fill #0

## 2015-11-13 NOTE — Assessment & Plan Note (Signed)
BP Readings from Last 3 Encounters:  11/12/15 135/88  07/30/15 138/88  06/13/15 117/72    Lab Results  Component Value Date   NA 139 04/25/2015   K 3.6 04/25/2015   CREATININE 0.83 04/25/2015    Assessment: Blood pressure control:  Well controlledComments: Taking Lisinopril 10 mg daily  Plan: Medications:  continue current medications Other plans: RTC in 6 weeks

## 2015-11-13 NOTE — Telephone Encounter (Signed)
Pharm calls and states pt's insurance does not cover accu-chek aviva they do cover accuchek smartview, she is given verbal to change to smartview, please change medlist to reflect this, thanks!

## 2015-11-13 NOTE — Assessment & Plan Note (Signed)
Lab Results  Component Value Date   HGBA1C 7.2 11/12/2015   HGBA1C 7.1 07/30/2015   HGBA1C 13.0* 04/23/2015     Assessment: Diabetes control:  Well controlled Progress toward A1C goal:   Near goal Comments: Patient takes Metformin 1000 mg daily + Insulin 70/30, 8 units in AM, 4 units in PM. His sister helps him with his insulin. She states she does not give him his insulin if his sugars are <120. There are frequently times when she does not give his insulin due to this. Does not check post-prandial CBG. No symptoms of hypoglycemia or recorded low blood sugars.   Plan: Medications:  continue current medications Home glucose monitoring: Frequency:  TID Timing:  Asked patient to check a post-prandial blood sugar 1-2 hours after a meal to get an idea of how hyperglycemic he becomes with food in order to better optimize his insulin regimen to bring his HbA1c <7.0.  Instruction/counseling given: reminded to bring blood glucose meter & log to each visit, reminded to bring medications to each visit, discussed foot care and discussed diet Other plans: RTC in 6 weeks.

## 2015-11-13 NOTE — Assessment & Plan Note (Signed)
Refused flu shot

## 2015-11-14 NOTE — Progress Notes (Signed)
Internal Medicine Clinic Attending  Case discussed with Dr. Jones at the time of the visit.  We reviewed the resident's history and exam and pertinent patient test results.  I agree with the assessment, diagnosis, and plan of care documented in the resident's note.  

## 2015-12-03 ENCOUNTER — Other Ambulatory Visit: Payer: Self-pay | Admitting: Internal Medicine

## 2015-12-03 MED FILL — metFORMIN HCL 500 MG TABS: 500 | 30 days supply | Qty: 120 | Fill #5

## 2015-12-03 MED FILL — ULTICARE SYRIN 0.3 ML 29GX1: 29G X 1/2" | 30 days supply | Qty: 30 | Fill #1

## 2015-12-03 MED FILL — NOVOLOG MIX 70/30 VIAL: (70-30) 100 | 34 days supply | Qty: 10 | Fill #2

## 2015-12-04 MED FILL — LISINOPRIL 10 MG TABLET: 10 | 30 days supply | Qty: 30 | Fill #0

## 2015-12-17 MED FILL — ACCU-CHEK SMARTVIEW STRIP: 34 days supply | Qty: 100 | Fill #1

## 2015-12-21 ENCOUNTER — Telehealth: Payer: Self-pay | Admitting: Internal Medicine

## 2015-12-21 NOTE — Telephone Encounter (Signed)
APPT. REMINDER CALL, LMTCB °

## 2015-12-24 ENCOUNTER — Ambulatory Visit (INDEPENDENT_AMBULATORY_CARE_PROVIDER_SITE_OTHER): Payer: Medicaid Other | Admitting: Internal Medicine

## 2015-12-24 ENCOUNTER — Encounter: Payer: Self-pay | Admitting: Internal Medicine

## 2015-12-24 VITALS — BP 129/75 | HR 68 | Temp 97.4°F | Ht 68.0 in | Wt 211.8 lb

## 2015-12-24 DIAGNOSIS — E118 Type 2 diabetes mellitus with unspecified complications: Principal | ICD-10-CM

## 2015-12-24 DIAGNOSIS — Z8673 Personal history of transient ischemic attack (TIA), and cerebral infarction without residual deficits: Secondary | ICD-10-CM | POA: Diagnosis not present

## 2015-12-24 DIAGNOSIS — Z7984 Long term (current) use of oral hypoglycemic drugs: Secondary | ICD-10-CM

## 2015-12-24 DIAGNOSIS — E1165 Type 2 diabetes mellitus with hyperglycemia: Secondary | ICD-10-CM

## 2015-12-24 LAB — GLUCOSE, CAPILLARY: Glucose-Capillary: 135 mg/dL — ABNORMAL HIGH (ref 65–99)

## 2015-12-24 MED ORDER — SITAGLIPTIN PHOSPHATE 100 MG PO TABS: 100.0000 mg | ORAL_TABLET | Freq: Every day | ORAL | Status: DC

## 2015-12-24 MED FILL — ACCU-CHEK FASTCLIX LANCETS: 34 days supply | Qty: 102 | Fill #1 | Status: TO

## 2015-12-24 MED FILL — JANUVIA 100 MG TABLET: 100 | 30 days supply | Qty: 30 | Fill #0

## 2015-12-24 MED FILL — CLOPIDOGREL 75 MG TABLET: 75 | 34 days supply | Qty: 34 | Fill #3

## 2015-12-24 NOTE — Progress Notes (Signed)
Subjective:   Patient ID: Paul Hanson male   DOB: [birth year] 50 y.o.   MRN: [id number]  HPI: Paul Hanson is a 50 y.o. with past medical history as outlined below who presents to clinic for DM f/u.   Please see problem list for status of the pt's chronic medical problems.  Past Medical History  Diagnosis Date  . Stroke ([place])   . Diabetes mellitus without complication ([place])   . Hypertension   . Brain tumor [facility])    Current Outpatient Prescriptions  Medication Sig Dispense Refill  . ACCU-CHEK FASTCLIX LANCETS MISC Check blood sugar 3 times a day 102 each 12  . atorvastatin (LIPITOR) 80 MG tablet Take 1 tablet (80 mg total) by mouth daily. 90 tablet 2  . baclofen (LIORESAL) 10 MG tablet Take 0.5 tablets (5 mg total) by mouth 3 (three) times daily. Start 1/2 tablet three times daily x 2 weeks then one tablet three times daily 30 each 3  . Blood Glucose Monitoring Suppl (ACCU-CHEK AVIVA CONNECT) w/Device KIT 1 each by Does not apply route 3 (three) times daily. 1 kit 1  . clopidogrel (PLAVIX) 75 MG tablet Take 1 tablet (75 mg total) by mouth daily. 90 tablet 3  . glucose blood (ACCU-CHEK AVIVA PLUS) test strip Check blood sugar 3 times a day 100 each 12  . insulin aspart protamine- aspart (NOVOLOG MIX 70/30) (70-30) 100 UNIT/ML injection 8 units subcutaneous every morning at breakfast and 4 units subcutaneous every evening at supper. 10 mL 11  . Insulin Syringe-Needle U-100 (INSULIN SYRINGE .3CC/29GX1/2") 29G X 1/2" 0.3 ML MISC As directed 100 each 0  . lisinopril (PRINIVIL,ZESTRIL) 10 MG tablet TAKE 1 TABLET BY MOUTH DAILY 30 tablet 3  . metFORMIN (GLUCOPHAGE) 1000 MG tablet Take 1 tablet (1,000 mg total) by mouth 2 (two) times daily with a meal. 60 tablet 11   No current facility-administered medications for this visit.   Family History  Problem Relation Age of Onset  . Heart disease Mother   . Hyperlipidemia Father   . Hypertension Father    Social History   Social  History  . Marital Status: Single    Spouse Name: N/A  . Number of Children: N/A  . Years of Education: N/A   Social History Main Topics  . Smoking status: Never Smoker   . Smokeless tobacco: None  . Alcohol Use: 0.6 - 1.2 oz/week    1-2 Shots of liquor per week     Comment: 3-4 times a year  . Drug Use: No  . Sexual Activity: Not Asked   Other Topics Concern  . None   Social History Narrative   Review of Systems: Review of Systems  Constitutional: Negative for fever and chills.  Eyes: Negative for blurred vision.  Gastrointestinal: Negative for nausea and vomiting.  Neurological: Negative for dizziness and tingling.  Endo/Heme/Allergies: Positive for polydipsia.       Polyuria     Objective:  Physical Exam: Filed Vitals:   12/24/15 0916  BP: 129/75  Pulse: 68  Temp: 97.4 F (36.3 C)  TempSrc: Oral  Height: _0  (1.727 m)  Weight: 211 lb 12.8 oz (96.072 kg)  SpO2: 100%   Physical Exam  Constitutional: He appears well-developed and well-nourished. No distress.  HENT:  Head: Normocephalic and atraumatic.  Nose: Nose normal.  Eyes: Conjunctivae and EOM are normal. No scleral icterus.  Cardiovascular: Normal rate, regular rhythm and normal heart sounds.  Exam reveals no gallop and no  friction rub.   No murmur heard. Pulmonary/Chest: Effort normal and breath sounds normal. No respiratory distress. He has no wheezes. He has no rales.  Abdominal: Soft. Bowel sounds are normal. He exhibits no distension and no mass. There is no tenderness. There is no rebound and no guarding.  Skin: Skin is warm and dry. No rash noted. He is not diaphoretic. No erythema. No pallor.  .  Assessment & Plan:   Please see problem based assessment and plan.

## 2015-12-24 NOTE — Assessment & Plan Note (Addendum)
Lab Results  Component Value Date   HGBA1C 7.2 11/12/2015   HGBA1C 7.1 07/30/2015   HGBA1C 13.0* 04/23/2015     Assessment: Diabetes control:   prior hx of stroke, goal a1c <6.5% Progress toward A1C goal:   unable to assess Comments: on metformin 1000mg  BID and 70/30 8 units in the am and 4 in the pm. Glucometer report ranges from 81-269  Plan: Medications:  D/c insulin, continue metformin. Start on januvia, will help w/ weight loss as well --BMI 32.3 Home glucose monitoring: Frequency:  once daily Timing:  am  IEducational resources provided:   Self management tools provided:   Other plans: f/u in 1 month for a1c. Counseled on diet and weight loss.

## 2015-12-24 NOTE — Patient Instructions (Signed)
Start taking [place] 100mg  daily. STOP taking insulin 70/30.   We will see you in 1 month for your hemoglobin a1c.   It was a pleasure meeting you today.

## 2015-12-25 NOTE — Addendum Note (Signed)
Addended by: Oval Linsey D on: 12/25/2015 02:25 PM   Modules accepted: Level of Service

## 2015-12-25 NOTE — Progress Notes (Signed)
Case discussed with Dr. Truong at the time of the visit.  We reviewed the resident's history and exam and pertinent patient test results.  I agree with the assessment, diagnosis, and plan of care documented in the resident's note. 

## 2016-01-01 MED FILL — LISINOPRIL 10 MG TABLET: 10 | 30 days supply | Qty: 30 | Fill #1

## 2016-01-01 MED FILL — metFORMIN HCL 500 MG TABS: 500 | 30 days supply | Qty: 120 | Fill #6

## 2016-01-18 ENCOUNTER — Telehealth: Payer: Self-pay | Admitting: Internal Medicine

## 2016-01-18 NOTE — Telephone Encounter (Signed)
APT. REMINDER CALL, LMTCB °

## 2016-01-21 ENCOUNTER — Encounter: Payer: Self-pay | Admitting: Internal Medicine

## 2016-01-21 ENCOUNTER — Ambulatory Visit: Payer: Medicaid Other | Admitting: Internal Medicine

## 2016-01-23 MED FILL — JANUVIA 100 MG TABLET: 100 | 30 days supply | Qty: 30 | Fill #1

## 2016-01-29 MED FILL — LISINOPRIL 10 MG TABLET: 10 | 30 days supply | Qty: 30 | Fill #2

## 2016-01-29 MED FILL — ACCU-CHEK SMARTVIEW STRIP: 34 days supply | Qty: 100 | Fill #2

## 2016-01-29 MED FILL — CLOPIDOGREL 75 MG TABLET: 75 | 34 days supply | Qty: 34 | Fill #4

## 2016-01-29 MED FILL — metFORMIN HCL 500 MG TABS: 500 | 30 days supply | Qty: 120 | Fill #7

## 2016-02-04 ENCOUNTER — Ambulatory Visit (INDEPENDENT_AMBULATORY_CARE_PROVIDER_SITE_OTHER): Payer: Medicaid Other | Admitting: Internal Medicine

## 2016-02-04 ENCOUNTER — Encounter: Payer: Self-pay | Admitting: Internal Medicine

## 2016-02-04 VITALS — BP 118/94 | HR 78 | Temp 98.2°F | Ht 68.0 in | Wt 213.2 lb

## 2016-02-04 DIAGNOSIS — E118 Type 2 diabetes mellitus with unspecified complications: Secondary | ICD-10-CM

## 2016-02-04 DIAGNOSIS — E1165 Type 2 diabetes mellitus with hyperglycemia: Secondary | ICD-10-CM

## 2016-02-04 DIAGNOSIS — Z7984 Long term (current) use of oral hypoglycemic drugs: Secondary | ICD-10-CM | POA: Diagnosis not present

## 2016-02-04 DIAGNOSIS — I10 Essential (primary) hypertension: Secondary | ICD-10-CM

## 2016-02-04 DIAGNOSIS — E119 Type 2 diabetes mellitus without complications: Secondary | ICD-10-CM | POA: Diagnosis not present

## 2016-02-04 DIAGNOSIS — H9193 Unspecified hearing loss, bilateral: Secondary | ICD-10-CM

## 2016-02-04 DIAGNOSIS — H9190 Unspecified hearing loss, unspecified ear: Secondary | ICD-10-CM | POA: Insufficient documentation

## 2016-02-04 DIAGNOSIS — IMO0002 Reserved for concepts with insufficient information to code with codable children: Secondary | ICD-10-CM

## 2016-02-04 DIAGNOSIS — H6123 Impacted cerumen, bilateral: Secondary | ICD-10-CM | POA: Diagnosis not present

## 2016-02-04 LAB — GLUCOSE, CAPILLARY: GLUCOSE-CAPILLARY: 126 mg/dL — AB (ref 65–99)

## 2016-02-04 LAB — POCT GLYCOSYLATED HEMOGLOBIN (HGB A1C): Hemoglobin A1C: 7

## 2016-02-04 MED ORDER — CARBAMIDE PEROXIDE 6.5 % OT SOLN
5.0000 [drp] | Freq: Two times a day (BID) | OTIC | Status: AC
Start: 2016-02-04 — End: 2017-02-03

## 2016-02-04 NOTE — Progress Notes (Signed)
Subjective:   Patient ID: Paul Hanson male   DOB: [birth year] 50 y.o.   MRN: [id number]  HPI: [name] is a 50 y.o. 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 Patient says his blood sugars have ben better at home since starting Januvia. He has been tolerating this medication well. His main complaint today is that he can't hear very well, says he thinks it's worse in the right ear rather than the left, but thinks that both care causing problems. No tinnitus, ear pain, drainage, fevers, sinus congestion, or rhinorrhea. He says he has seen a doctor in the past that says he has had abundant earwax before. Today on exam, it is quite apparent that he has bilateral cerumen impaction. No other issues.   Past Medical History  Diagnosis Date  . Stroke ([place])   . Diabetes mellitus without complication ([place])   . Hypertension   . Brain tumor [facility])    Current Outpatient Prescriptions  Medication Sig Dispense Refill  . ACCU-CHEK FASTCLIX LANCETS MISC Check blood sugar 3 times a day 102 each 12  . atorvastatin (LIPITOR) 80 MG tablet Take 1 tablet (80 mg total) by mouth daily. 90 tablet 2  . baclofen (LIORESAL) 10 MG tablet Take 0.5 tablets (5 mg total) by mouth 3 (three) times daily. Start 1/2 tablet three times daily x 2 weeks then one tablet three times daily 30 each 3  . Blood Glucose Monitoring Suppl (ACCU-CHEK AVIVA CONNECT) w/Device KIT 1 each by Does not apply route 3 (three) times daily. 1 kit 1  . clopidogrel (PLAVIX) 75 MG tablet Take 1 tablet (75 mg total) by mouth daily. 90 tablet 3  . glucose blood (ACCU-CHEK AVIVA PLUS) test strip Check blood sugar 3 times a day 100 each 12  . Insulin Syringe-Needle U-100 (INSULIN SYRINGE .3CC/29GX1/2") 29G X 1/2" 0.3 ML MISC As directed 100 each 0  . lisinopril (PRINIVIL,ZESTRIL) 10 MG tablet TAKE 1  TABLET BY MOUTH DAILY 30 tablet 3  . metFORMIN (GLUCOPHAGE) 1000 MG tablet Take 1 tablet (1,000 mg total) by mouth 2 (two) times daily with a meal. 60 tablet 11  . sitaGLIPtin (JANUVIA) 100 MG tablet Take 1 tablet (100 mg total) by mouth daily. 30 tablet 3   No current facility-administered medications for this visit.    Review of Systems: General: Denies fever, chills, diaphoresis, appetite change and fatigue.  Respiratory: Denies SOB, DOE, cough, and wheezing.   Cardiovascular: Denies chest pain and palpitations.  Gastrointestinal: Denies nausea, vomiting, abdominal pain, and diarrhea.  Genitourinary: Denies dysuria, increased frequency, and flank pain. Endocrine: Denies hot or cold intolerance, polyuria, and polydipsia. Musculoskeletal: Denies myalgias, back pain, joint swelling, arthralgias and gait problem.  Skin: Denies pallor, rash and wounds.  Neurological: Positive for decreased hearing. Denies dizziness, seizures, syncope, weakness, lightheadedness, numbness and headaches.  Psychiatric/Behavioral: Denies mood changes, and sleep disturbances.  Objective:   Physical Exam: Filed Vitals:   02/04/16 0930  BP: 118/94  Pulse: 78  Temp: 98.2 F (36.8 C)  TempSrc: Oral  Height: '5\' 8"'$  (1.727 m)  Weight: 213 lb 3.2 oz (96.707 kg)  SpO2: 100%    General: Alert, cooperative, NAD. HEENT: PERRL, EOMI. Moist mucus membranes. Severe cerumen impaction, visible without otoscope. No tenderness. Not able to be cleared with ear wash.  Neck: Full range  of motion without pain, supple, no lymphadenopathy or carotid bruits Lungs: Clear to ascultation bilaterally, normal work of respiration, no wheezes, rales, rhonchi Heart: RRR, no murmurs, gallops, or rubs.  Abdomen: Soft, non-tender, non-distended, BS + Extremities: No cyanosis, clubbing, or edema Neurologic: Alert & oriented x3, cranial nerves II-XII intact, 4+/5 strength in LUE, 4/5 in LLE, otherwise strength intact. Sensation intact to  light touch   Assessment & Plan:   Please see problem based assessment and plan.

## 2016-02-04 NOTE — Assessment & Plan Note (Signed)
Lab Results  Component Value Date   HGBA1C 7.0 02/04/2016   HGBA1C 7.2 11/12/2015   HGBA1C 7.1 07/30/2015     Assessment: Diabetes control:  Controlled Progress toward A1C goal:   Close to goal Comments: Now taking Metformin 1000 mg bid + Januvia 100 mg daily. Tolerating this well. Checks his CBG's regularly, average is 147.   Plan: Medications:  continue current medications Home glucose monitoring: Frequency:  TID Instruction/counseling given: reminded to bring blood glucose meter & log to each visit, reminded to bring medications to each visit, discussed foot care and discussed diet Educational resources provided:   Self management tools provided: copy of home glucose meter download Other plans: RTC for DM follow up in 3 months

## 2016-02-04 NOTE — Assessment & Plan Note (Signed)
BP Readings from Last 3 Encounters:  02/04/16 118/94  12/24/15 129/75  11/12/15 135/88    Lab Results  Component Value Date   NA 139 04/25/2015   K 3.6 04/25/2015   CREATININE 0.83 04/25/2015    Assessment: Blood pressure control:  Well controlled Progress toward BP goal:   At goal Comments: Taking Lipitor 10 mg daily  Plan: Medications:  continue current medications Other plans: RTC in 3 months

## 2016-02-04 NOTE — Assessment & Plan Note (Signed)
Patient states he has mild hearing loss that has been causing him trouble for the past several weeks. No tinnitus, ear pain, dizziness, nausea, sinus congestion, fevers, or ear drainage. On exam, cerumen visualized in external auditory canal without otoscope. Unable to use otoscope given severity of impaction, bilaterally. Attempted ear wash and speculum disimpaction, however, was too hard to clear effectively.  -Given Debrox to use bid -Patient will return in 1 week to re-attempt cleaning -If unable to clear at that time, will refer to ENT

## 2016-02-04 NOTE — Patient Instructions (Signed)
1. Please make a follow up appointment for 3 months.   2. Please take all medications as previously prescribed with the following changes:  Continue Januvia.   Use Debrox twice daily for 1 week to soften ear wax. You can repeat this after some time if you feel the wax is beginning to accumulate again.   3. If you have worsening of your symptoms or new symptoms arise, please call the clinic [phone]), or go to the ER immediately if symptoms are severe.  You have done a great job in taking all your medications. Please continue to do this.

## 2016-02-07 NOTE — Progress Notes (Signed)
Internal Medicine Clinic Attending  Case discussed with Dr. Jones at the time of the visit.  We reviewed the resident's history and exam and pertinent patient test results.  I agree with the assessment, diagnosis, and plan of care documented in the resident's note.  

## 2016-02-11 ENCOUNTER — Encounter: Payer: Self-pay | Admitting: Internal Medicine

## 2016-02-12 ENCOUNTER — Ambulatory Visit: Payer: Medicaid Other | Admitting: Internal Medicine

## 2016-02-19 MED FILL — ATORVASTATIN 80 MG TABLET: 80 | 90 days supply | Qty: 90 | Fill #1

## 2016-02-19 MED FILL — JANUVIA 100 MG TABLET: 100 | 30 days supply | Qty: 30 | Fill #2

## 2016-03-03 MED FILL — metFORMIN HCL 500 MG TABS: 500 | 30 days supply | Qty: 120 | Fill #8

## 2016-03-03 MED FILL — LISINOPRIL 10 MG TABLET: 10 | 30 days supply | Qty: 30 | Fill #3

## 2016-03-03 MED FILL — ACCU-CHEK SMARTVIEW STRIP: 34 days supply | Qty: 100 | Fill #3 | Status: TO

## 2016-03-03 MED FILL — CLOPIDOGREL 75 MG TABLET: 75 | 34 days supply | Qty: 34 | Fill #5

## 2016-03-12 ENCOUNTER — Ambulatory Visit (INDEPENDENT_AMBULATORY_CARE_PROVIDER_SITE_OTHER): Payer: Medicaid Other | Admitting: Internal Medicine

## 2016-03-12 ENCOUNTER — Encounter: Payer: Self-pay | Admitting: Internal Medicine

## 2016-03-12 VITALS — BP 120/80 | HR 78 | Temp 98.2°F | Ht 68.0 in | Wt 216.8 lb

## 2016-03-12 DIAGNOSIS — I10 Essential (primary) hypertension: Secondary | ICD-10-CM

## 2016-03-12 DIAGNOSIS — Z8673 Personal history of transient ischemic attack (TIA), and cerebral infarction without residual deficits: Secondary | ICD-10-CM

## 2016-03-12 DIAGNOSIS — Z7984 Long term (current) use of oral hypoglycemic drugs: Secondary | ICD-10-CM | POA: Diagnosis not present

## 2016-03-12 DIAGNOSIS — H938X3 Other specified disorders of ear, bilateral: Secondary | ICD-10-CM | POA: Diagnosis not present

## 2016-03-12 DIAGNOSIS — E1165 Type 2 diabetes mellitus with hyperglycemia: Secondary | ICD-10-CM | POA: Diagnosis not present

## 2016-03-12 DIAGNOSIS — IMO0002 Reserved for concepts with insufficient information to code with codable children: Secondary | ICD-10-CM

## 2016-03-12 DIAGNOSIS — E118 Type 2 diabetes mellitus with unspecified complications: Secondary | ICD-10-CM

## 2016-03-12 DIAGNOSIS — H6123 Impacted cerumen, bilateral: Secondary | ICD-10-CM

## 2016-03-12 NOTE — Patient Instructions (Addendum)
1. Please make a follow up appointment for 2 months.   2. Please take all medications as previously prescribed  3. Use Debrox twice daily for 1 week to soften ear wax. You can repeat this after some time if you feel the wax is beginning to accumulate again. You can get some saline wash and use that to clean out your ears occasionally.  3. If you have worsening of your symptoms or new symptoms arise, please call the clinic [phone]), or go to the ER immediately if symptoms are severe.  You have done a great job in taking all your medications. Please continue to do this.

## 2016-03-12 NOTE — Progress Notes (Signed)
Patient ID: Carrson Lightcap, male   DOB: [birth year], 50 y.o.   MRN: [id number]   Subjective:   Patient ID: Ishmeal Rorie male   DOB: 1965/09/20 50 y.o.   MRN: [id number]  HPI: Mr.Brigham Warshaw is a 50 y.o. male with a past medical history listed below here today for follow up of his diabetes and hypertension.   For details of today's visit and the status of his chronic medical issues please refer to the assessment and plan.  Past Medical History  Diagnosis Date  . Stroke ([place])   . Diabetes mellitus without complication ([place])   . Hypertension   . Brain tumor [facility])    Current Outpatient Prescriptions  Medication Sig Dispense Refill  . ACCU-CHEK FASTCLIX LANCETS MISC Check blood sugar 3 times a day 102 each 12  . atorvastatin (LIPITOR) 80 MG tablet Take 1 tablet (80 mg total) by mouth daily. 90 tablet 2  . baclofen (LIORESAL) 10 MG tablet Take 0.5 tablets (5 mg total) by mouth 3 (three) times daily. Start 1/2 tablet three times daily x 2 weeks then one tablet three times daily 30 each 3  . Blood Glucose Monitoring Suppl (ACCU-CHEK AVIVA CONNECT) w/Device KIT 1 each by Does not apply route 3 (three) times daily. 1 kit 1  . carbamide peroxide (DEBROX) 6.5 % otic solution Place 5 drops into both ears 2 (two) times daily. 15 mL 1  . clopidogrel (PLAVIX) 75 MG tablet Take 1 tablet (75 mg total) by mouth daily. 90 tablet 3  . glucose blood (ACCU-CHEK AVIVA PLUS) test strip Check blood sugar 3 times a day 100 each 12  . Insulin Syringe-Needle U-100 (INSULIN SYRINGE .3CC/29GX1/2") 29G X 1/2" 0.3 ML MISC As directed 100 each 0  . lisinopril (PRINIVIL,ZESTRIL) 10 MG tablet TAKE 1 TABLET BY MOUTH DAILY 30 tablet 3  . metFORMIN (GLUCOPHAGE) 1000 MG tablet Take 1 tablet (1,000 mg total) by mouth 2 (two) times daily with a meal. 60 tablet 11  . sitaGLIPtin (JANUVIA) 100 MG tablet Take 1 tablet (100 mg total) by mouth daily. 30 tablet 3   No current facility-administered medications for this visit.    Family History  Problem Relation Age of Onset  . Heart disease Mother   . Hyperlipidemia Father   . Hypertension Father    Social History   Social History  . Marital Status: Single    Spouse Name: N/A  . Number of Children: N/A  . Years of Education: N/A   Social History Main Topics  . Smoking status: Never Smoker   . Smokeless tobacco: None  . Alcohol Use: 0.6 - 1.2 oz/week    1-2 Shots of liquor per week     Comment: 3-4 times a year  . Drug Use: No  . Sexual Activity: Not Asked   Other Topics Concern  . None   Social History Narrative   Review of Systems: Review of Systems  Constitutional: Negative for fever, chills and malaise/fatigue.  HENT: Negative for ear discharge, ear pain and hearing loss.   Eyes: Negative for blurred vision and double vision.  Gastrointestinal: Negative for nausea and vomiting.  Musculoskeletal: Negative for myalgias.  Skin: Negative for rash.  Neurological: Negative for dizziness and headaches.   Objective:  Physical Exam: Filed Vitals:   03/12/16 1414 03/12/16 1433  BP: 139/91 120/80  Pulse: 94 78  Temp: 98.2 F (36.8 C)   TempSrc: Oral   Height: '5\' 8"'$  (1.727 m)   Weight: 216 lb 12.8  oz (98.34 kg)   SpO2: 100%     PHYSICAL EXAM General: Alert, cooperative, NAD. HEENT: PERRL, EOMI. Moist mucus membranes. Cerumen accumulation wtihout impaction, unable to visualize tympanic membrane. No tenderness.  Neck: Full range of motion without pain, supple, no lymphadenopathy or carotid bruits Lungs: Clear to ascultation bilaterally, normal work of respiration, no wheezes, rales, rhonchi Heart: RRR, no murmurs, gallops, or rubs.  Abdomen: Soft, non-tender, non-distended, BS + Extremities: No cyanosis, clubbing, or edema Neurologic: Alert & oriented x3, cranial nerves II-XII intact, 4+/5 strength in LUE, 4/5 in LLE, otherwise strength intact. Sensation intact to light touch  Assessment & Plan:   Case discussed with Dr. Lynnae January.  Please refer to Problem based carting for further details of today's visit.

## 2016-03-12 NOTE — Assessment & Plan Note (Signed)
Reports getting Debrox and resolution of symptoms afterwards. Denies any further hearing loss or other symptoms since that time. No tinnitus, ear pain, dizziness, nausea, sinus congestion or ear discharge.  On exam with otoscope, there is significant cerumen build up without impaction.   Plan Discussed using debrox occasionally and using saline to clean his ear occasionally. Discussed avoid using any Q-tips to clean his ears (reports not using them)

## 2016-03-12 NOTE — Assessment & Plan Note (Signed)
Lab Results  Component Value Date   HGBA1C 7.0 02/04/2016   HGBA1C 7.2 11/12/2015   HGBA1C 7.1 07/30/2015    Assessment: Last A1c 7.0 on 5/22. Currently on Metformin 1000 mg bid and Januvia100 mg daily. Denies any side effects. Checks his CBG bid. Brought his meter with him today which shows 57 readings with average of 158. Range 104-255. Denies any episodes of hypoglycemia.   Plan: RTC in 2 months for DM follow up Will likely space out visits to q72months after that if DM remains well controlled

## 2016-03-12 NOTE — Assessment & Plan Note (Signed)
BP Readings from Last 3 Encounters:  03/12/16 120/80  02/04/16 118/94  12/24/15 129/75    Lab Results  Component Value Date   NA 139 04/25/2015   K 3.6 04/25/2015   CREATININE 0.83 04/25/2015   Assessment: BP initially slightly elevated at 139/91 on arrival (goal 130/80 given his history of CVA and age). 120/80 on re-check. Does check his BP at home and reports he is usually in the [id number] systolic. Denies any headaches or vision changes.  Currently on on Lisinopril 10 mg daily. Reports compliance.   Plan: Continue current medicatons

## 2016-03-14 NOTE — Progress Notes (Signed)
Internal Medicine Clinic Attending  Case discussed with Dr. Boswell at the time of the visit.  We reviewed the resident's history and exam and pertinent patient test results.  I agree with the assessment, diagnosis, and plan of care documented in the resident's note.  

## 2016-03-21 ENCOUNTER — Encounter: Payer: Self-pay | Admitting: *Deleted

## 2016-03-25 MED FILL — JANUVIA 100 MG TABLET: 100 | 30 days supply | Qty: 30 | Fill #3

## 2016-03-31 ENCOUNTER — Other Ambulatory Visit: Payer: Self-pay | Admitting: Internal Medicine

## 2016-04-01 MED FILL — LISINOPRIL 10 MG TABLET: 10 | 90 days supply | Qty: 90 | Fill #0

## 2016-04-03 MED FILL — CLOPIDOGREL 75 MG TABLET: 75 | 34 days supply | Qty: 34 | Fill #6

## 2016-04-08 MED FILL — metFORMIN HCL 500 MG TABS: 500 | 30 days supply | Qty: 120 | Fill #9

## 2016-04-24 ENCOUNTER — Other Ambulatory Visit: Payer: Self-pay | Admitting: Internal Medicine

## 2016-04-24 DIAGNOSIS — E118 Type 2 diabetes mellitus with unspecified complications: Principal | ICD-10-CM

## 2016-04-24 DIAGNOSIS — E1165 Type 2 diabetes mellitus with hyperglycemia: Secondary | ICD-10-CM

## 2016-04-24 MED FILL — JANUVIA 100 MG TABLET: 100 | 30 days supply | Qty: 30 | Fill #0

## 2016-04-24 NOTE — Telephone Encounter (Signed)
Last appt 03/12/16. Next appt 06/18/16.

## 2016-05-21 ENCOUNTER — Encounter: Payer: Medicaid Other | Admitting: Internal Medicine

## 2016-05-21 ENCOUNTER — Other Ambulatory Visit: Payer: Self-pay | Admitting: *Deleted

## 2016-05-21 DIAGNOSIS — I6359 Cerebral infarction due to unspecified occlusion or stenosis of other cerebral artery: Secondary | ICD-10-CM

## 2016-05-21 MED ORDER — CLOPIDOGREL BISULFATE 75 MG PO TABS: 75.0000 mg | ORAL_TABLET | Freq: Every day | ORAL | 3 refills | Status: DC

## 2016-05-21 MED FILL — metFORMIN HCL 500 MG TABS: 500 | 30 days supply | Qty: 120 | Fill #10

## 2016-05-21 MED FILL — ATORVASTATIN 80 MG TABLET: 80 | 90 days supply | Qty: 90 | Fill #2

## 2016-05-21 MED FILL — CLOPIDOGREL 75 MG TABLET: 75 | 90 days supply | Qty: 90 | Fill #0

## 2016-06-18 ENCOUNTER — Ambulatory Visit (INDEPENDENT_AMBULATORY_CARE_PROVIDER_SITE_OTHER): Payer: Self-pay | Admitting: Internal Medicine

## 2016-06-18 ENCOUNTER — Encounter: Payer: Self-pay | Admitting: Internal Medicine

## 2016-06-18 ENCOUNTER — Ambulatory Visit: Payer: Self-pay | Admitting: Pharmacist

## 2016-06-18 VITALS — BP 151/78 | HR 86 | Temp 97.8°F | Ht 68.0 in | Wt 218.4 lb

## 2016-06-18 DIAGNOSIS — I69315 Cognitive social or emotional deficit following cerebral infarction: Secondary | ICD-10-CM

## 2016-06-18 DIAGNOSIS — Z597 Insufficient social insurance and welfare support: Secondary | ICD-10-CM

## 2016-06-18 DIAGNOSIS — E118 Type 2 diabetes mellitus with unspecified complications: Principal | ICD-10-CM

## 2016-06-18 DIAGNOSIS — IMO0002 Reserved for concepts with insufficient information to code with codable children: Secondary | ICD-10-CM

## 2016-06-18 DIAGNOSIS — E1165 Type 2 diabetes mellitus with hyperglycemia: Secondary | ICD-10-CM

## 2016-06-18 DIAGNOSIS — I6359 Cerebral infarction due to unspecified occlusion or stenosis of other cerebral artery: Secondary | ICD-10-CM

## 2016-06-18 DIAGNOSIS — Z7984 Long term (current) use of oral hypoglycemic drugs: Secondary | ICD-10-CM

## 2016-06-18 DIAGNOSIS — Z7902 Long term (current) use of antithrombotics/antiplatelets: Secondary | ICD-10-CM

## 2016-06-18 DIAGNOSIS — Z79899 Other long term (current) drug therapy: Secondary | ICD-10-CM

## 2016-06-18 DIAGNOSIS — E119 Type 2 diabetes mellitus without complications: Secondary | ICD-10-CM

## 2016-06-18 DIAGNOSIS — I10 Essential (primary) hypertension: Secondary | ICD-10-CM

## 2016-06-18 DIAGNOSIS — F603 Borderline personality disorder: Secondary | ICD-10-CM

## 2016-06-18 LAB — GLUCOSE, CAPILLARY: GLUCOSE-CAPILLARY: 218 mg/dL — AB (ref 65–99)

## 2016-06-18 LAB — POCT GLYCOSYLATED HEMOGLOBIN (HGB A1C): HEMOGLOBIN A1C: 7.7

## 2016-06-18 MED ORDER — LISINOPRIL 10 MG PO TABS: 10.0000 mg | ORAL_TABLET | Freq: Every day | ORAL | 0 refills | Status: DC

## 2016-06-18 MED ORDER — BACLOFEN 10 MG PO TABS: 5.0000 mg | ORAL_TABLET | Freq: Three times a day (TID) | ORAL | 0 refills | Status: DC

## 2016-06-18 MED ORDER — ATORVASTATIN CALCIUM 80 MG PO TABS: 80.0000 mg | ORAL_TABLET | Freq: Every day | ORAL | 0 refills | Status: DC

## 2016-06-18 MED ORDER — CLOPIDOGREL BISULFATE 75 MG PO TABS: 75.0000 mg | ORAL_TABLET | Freq: Every day | ORAL | 0 refills | Status: DC

## 2016-06-18 MED ORDER — SITAGLIPTIN PHOSPHATE 100 MG PO TABS: 100.0000 mg | ORAL_TABLET | Freq: Every day | ORAL | 0 refills | Status: DC

## 2016-06-18 MED ORDER — METFORMIN HCL 1000 MG PO TABS: 1000.0000 mg | ORAL_TABLET | Freq: Two times a day (BID) | ORAL | 0 refills | Status: DC

## 2016-06-18 MED FILL — metFORMIN HCL 1000 MG TABS: 1000 | 30 days supply | Qty: 60 | Fill #0

## 2016-06-18 MED FILL — JANUVIA 100 MG TABLET: 100 | 30 days supply | Qty: 30 | Fill #0

## 2016-06-18 MED FILL — BACLOFEN 10 MG TABLET: 10 | 20 days supply | Qty: 30 | Fill #0

## 2016-06-18 NOTE — Patient Instructions (Signed)
Thank you for coming in today.  I would like to start you on a new medication today called Cymbalta. Once that gets approved by your pharmacy I will send in the prescription.   I would like to see you back in 3 months.

## 2016-06-18 NOTE — Progress Notes (Signed)
   CC: DM, HTN follow up  HPI:  [name] is a 50 y.o. male with a past medical history listed below here today for follow up of his DM and HTN.   For details of today's visit and the status of his chronic medical issues please refer to the assessment and plan.   Past Medical History:  Diagnosis Date  . Brain tumor ([place])   . Diabetes mellitus without complication ([place])   . Hypertension   . Stroke [facility])     Review of Systems:  Review of Systems  Eyes: Negative for blurred vision and double vision.  Gastrointestinal: Negative for nausea and vomiting.  Neurological: Negative for dizziness, focal weakness and headaches.  Psychiatric/Behavioral: Negative for depression.     Physical Exam:  Vitals:   06/18/16 1641  BP: (!) 151/78  Pulse: 86  Temp: 97.8 F (36.6 C)  TempSrc: Oral  SpO2: 100%  Weight: 218 lb 6.4 oz (99.1 kg)  Height: 5\' 8"  (1.727 m)   General: Alert, cooperative, NAD. HEENT: PERRL, EOMI. Moist mucus membranes. Cerumen accumulation wtihout impaction, unable to visualize tympanic membrane. No tenderness.  Neck: Full range of motion without pain, supple, no lymphadenopathy or carotid bruits Lungs: Clear to ascultation bilaterally, normal work of respiration, no wheezes, rales, rhonchi Heart: RRR, no murmurs, gallops, or rubs.  Abdomen: Soft, non-tender, non-distended, BS + Extremities: No cyanosis, clubbing, or edema Neurologic: Alert & oriented x3, cranial nerves II-XII intact, 4+/5 strength in LUE, 4/5 in LLE, otherwise strength intact. Sensation intact to light touch   Assessment & Plan:   See Encounters Tab for problem based charting.  Patient discussed with Dr. Eppie Gibson

## 2016-06-19 ENCOUNTER — Ambulatory Visit (INDEPENDENT_AMBULATORY_CARE_PROVIDER_SITE_OTHER): Payer: Self-pay | Admitting: Pharmacist

## 2016-06-19 ENCOUNTER — Encounter: Payer: Self-pay | Admitting: Pharmacist

## 2016-06-19 DIAGNOSIS — Z7189 Other specified counseling: Secondary | ICD-10-CM

## 2016-06-19 DIAGNOSIS — Z79899 Other long term (current) drug therapy: Secondary | ICD-10-CM

## 2016-06-19 NOTE — Progress Notes (Signed)
S: Paul Hanson is a 50 y.o. male reports to clinical pharmacist appointment for help with medication management.  No Known Allergies Medication Sig  ACCU-CHEK FASTCLIX LANCETS MISC Check blood sugar 3 times a day  atorvastatin (LIPITOR) 80 MG tablet Take 1 tablet (80 mg total) by mouth daily. IM pharmacy  baclofen (LIORESAL) 10 MG tablet Take 0.5 tablets (5 mg total) by mouth 3 (three) times daily. IM program  Blood Glucose Monitoring Suppl (ACCU-CHEK AVIVA CONNECT) w/Device KIT 1 each by Does not apply route 3 (three) times daily.  carbamide peroxide (DEBROX) 6.5 % otic solution Place 5 drops into both ears 2 (two) times daily.  clopidogrel (PLAVIX) 75 MG tablet Take 1 tablet (75 mg total) by mouth daily. IM program  glucose blood (ACCU-CHEK AVIVA PLUS) test strip Check blood sugar 3 times a day  Insulin Syringe-Needle U-100 (INSULIN SYRINGE .3CC/29GX1/2") 29G X 1/2" 0.3 ML MISC As directed  lisinopril (PRINIVIL,ZESTRIL) 10 MG tablet Take 1 tablet (10 mg total) by mouth daily. IM program  metFORMIN (GLUCOPHAGE) 1000 MG tablet Take 1 tablet (1,000 mg total) by mouth 2 (two) times daily with a meal. IM program  sitaGLIPtin (JANUVIA) 100 MG tablet Take 1 tablet (100 mg total) by mouth daily. IM program   Past Medical History:  Diagnosis Date  . Brain tumor ([place])   . Diabetes mellitus without complication ([place])   . Hypertension   . Stroke [facility])    Social History   Social History  . Marital status: Single    Spouse name: N/A  . Number of children: N/A  . Years of education: N/A   Social History Main Topics  . Smoking status: Never Smoker  . Smokeless tobacco: Not on file  . Alcohol use 0.6 - 1.2 oz/week    1 - 2 Shots of liquor per week     Comment: 3-4 times a year  . Drug use: No  . Sexual activity: Not on file   Other Topics Concern  . Not on file   Social History Narrative  . No narrative on file   Family History  Problem Relation Age of Onset  . Heart disease  Mother   . Hyperlipidemia Father   . Hypertension Father    O:    Component Value Date/Time   CHOL 117 06/13/2015 1647   HDL 26 (L) 06/13/2015 1647   TRIG 191 (H) 06/13/2015 1647   AST 17 06/13/2015 1647   ALT 23 06/13/2015 1647   NA 139 04/25/2015 0558   K 3.6 04/25/2015 0558   CL 106 04/25/2015 0558   CO2 23 04/25/2015 0558   GLUCOSE 183 (H) 04/25/2015 0558   HGBA1C 7.7 06/18/2016 1639   HGBA1C 13.0 (H) 04/23/2015 0816   BUN 9 04/25/2015 0558   CREATININE 0.83 04/25/2015 0558   CALCIUM 9.0 04/25/2015 0558   GFRAA >60 04/25/2015 0558   WBC 6.3 04/25/2015 0558   HGB 12.3 (L) 04/25/2015 0558   HCT 36.8 (L) 04/25/2015 0558   PLT 201 04/25/2015 0558   Ht Readings from Last 2 Encounters:  06/18/16 _0  (1.727 m)  03/12/16 _1  (1.727 m)   Wt Readings from Last 2 Encounters:  06/18/16 218 lb 6.4 oz (99.1 kg)  03/12/16 216 lb 12.8 oz (98.3 kg)   There is no height or weight on file to calculate BMI. BP Readings from Last 3 Encounters:  06/18/16 (!) 151/78  03/12/16 120/80  02/04/16 (!) 118/94     A/P: Patient  stated he is out of medications. Patient referred to [facility] since he qualifies for this program and has medications on formulary.   The patient verbalized understanding of information provided by repeating back concepts discussed. Will follow up and facilitate transfer to new pharmacy once approved. Also as discussed with Dr. Charlynn Grimes will initiate duloxetine at that time for neuropathic pain.

## 2016-06-21 DIAGNOSIS — F603 Borderline personality disorder: Secondary | ICD-10-CM | POA: Insufficient documentation

## 2016-06-21 NOTE — Assessment & Plan Note (Addendum)
Lab Results  Component Value Date   HGBA1C 7.7 06/18/2016   HGBA1C 7.0 02/04/2016   HGBA1C 7.2 11/12/2015     Assessment: Currently on Metformin 1000 mg bid and Januvia100 mg daily. Did not bring this meter today. Has been unable to get his medications for the past month after losing Medicaid coverage. A1c 7.7 today from 7.0 at previous visit.   Plan: Continue current medications Discussed with Dr. Maudie Mercury who also met with the patient. Initiated getting him enrolled in the [facility] since he qualifies for this program and has medications on formulary.

## 2016-06-21 NOTE — Assessment & Plan Note (Signed)
He has had emotional disturbances since his CVA last year. He says that he has had periods where he would begin crying for no reason. His sister also says he has been very difficult with episodes of anger, raised voice and fixation on topics. She says he will get fixated on some small issue and become anger and unable to let the subject go for a prolonged period of time. He says that he is mostly unaware of these episodes but does say that he occasionally does realize this is happening.   PHQ2 is zero  Plan Will start Cymbalta today

## 2016-06-21 NOTE — Assessment & Plan Note (Signed)
Unable to get his medications for the past month after losing Medicaid.   Discussed with Dr. Maudie Mercury who also met with the patient. Initiated getting him enrolled in the [place] [place] since he qualifies for this program and has medications on formulary.  Continue Lisinopril 10 mg, Metformin, Januiva, Plavix and Lipitor 80 mg daily

## 2016-06-21 NOTE — Assessment & Plan Note (Signed)
BP Readings from Last 3 Encounters:  06/18/16 (!) 151/78  03/12/16 120/80  02/04/16 (!) 118/94    Lab Results  Component Value Date   NA 139 04/25/2015   K 3.6 04/25/2015   CREATININE 0.83 04/25/2015    Assessment: Patient is currently on Lisinopril 10 mg daily only for his HTN. Had previously been well controlled. Unfortunately, he lost his medicaid and has been unable to get any of his medications for the past month. Says he checks his BP 1-2 times a month and previously BP well controlled in the 120-130 range at home while on Lisinopril. 151/78 today off medications. Denies any symptoms of headaches, vision changes, nausea.   Plan: Discussed with Dr. Maudie Mercury who also met with the patient. Initiated getting him enrolled in the [place] since he qualifies for this program and has medications on formulary. Will continue Lisinopril 10 mg daily as previously well controlled on this regimen

## 2016-06-23 ENCOUNTER — Ambulatory Visit: Payer: Self-pay | Admitting: Pharmacist

## 2016-06-23 DIAGNOSIS — Z79899 Other long term (current) drug therapy: Secondary | ICD-10-CM

## 2016-06-23 MED FILL — LISINOPRIL 10 MG TABLET: 10 | 30 days supply | Qty: 30 | Fill #0

## 2016-06-23 MED FILL — ACCU-CHEK SMARTVIEW TEST ST: 33 days supply | Qty: 100 | Fill #0

## 2016-06-23 MED FILL — ACCU-CHEK FASTCLIX LANCETS: 34 days supply | Qty: 102 | Fill #0

## 2016-06-23 NOTE — Progress Notes (Signed)
S: Paul Hanson is a 50 y.o. male reports to clinical pharmacist appointment for help with medication access.   No Known Allergies Medication Sig  ACCU-CHEK FASTCLIX LANCETS MISC Check blood sugar 3 times a day  atorvastatin (LIPITOR) 80 MG tablet Take 1 tablet (80 mg total) by mouth daily. IM pharmacy  baclofen (LIORESAL) 10 MG tablet Take 0.5 tablets (5 mg total) by mouth 3 (three) times daily. IM program  Blood Glucose Monitoring Suppl (ACCU-CHEK AVIVA CONNECT) w/Device KIT 1 each by Does not apply route 3 (three) times daily.  carbamide peroxide (DEBROX) 6.5 % otic solution Place 5 drops into both ears 2 (two) times daily.  clopidogrel (PLAVIX) 75 MG tablet Take 1 tablet (75 mg total) by mouth daily. IM program  glucose blood (ACCU-CHEK AVIVA PLUS) test strip Check blood sugar 3 times a day  Insulin Syringe-Needle U-100 (INSULIN SYRINGE .3CC/29GX1/2") 29G X 1/2" 0.3 ML MISC As directed  lisinopril (PRINIVIL,ZESTRIL) 10 MG tablet Take 1 tablet (10 mg total) by mouth daily. IM program  metFORMIN (GLUCOPHAGE) 1000 MG tablet Take 1 tablet (1,000 mg total) by mouth 2 (two) times daily with a meal. IM program  sitaGLIPtin (JANUVIA) 100 MG tablet Take 1 tablet (100 mg total) by mouth daily. IM program   Past Medical History:  Diagnosis Date  . Brain tumor ([place])   . Diabetes mellitus without complication ([place])   . Hypertension   . Stroke [facility])    Social History   Social History  . Marital status: Single    Spouse name: N/A  . Number of children: N/A  . Years of education: N/A   Social History Main Topics  . Smoking status: Never Smoker  . Smokeless tobacco: Not on file  . Alcohol use 0.6 - 1.2 oz/week    1 - 2 Shots of liquor per week     Comment: 3-4 times a year  . Drug use: No  . Sexual activity: Not on file   Other Topics Concern  . Not on file   Social History Narrative  . No narrative on file   Family History  Problem Relation Age of Onset  . Heart disease Mother    . Hyperlipidemia Father   . Hypertension Father    O:    Component Value Date/Time   CHOL 117 06/13/2015 1647   HDL 26 (L) 06/13/2015 1647   TRIG 191 (H) 06/13/2015 1647   AST 17 06/13/2015 1647   ALT 23 06/13/2015 1647   NA 139 04/25/2015 0558   K 3.6 04/25/2015 0558   CL 106 04/25/2015 0558   CO2 23 04/25/2015 0558   GLUCOSE 183 (H) 04/25/2015 0558   HGBA1C 7.7 06/18/2016 1639   HGBA1C 13.0 (H) 04/23/2015 0816   BUN 9 04/25/2015 0558   CREATININE 0.83 04/25/2015 0558   CALCIUM 9.0 04/25/2015 0558   GFRAA >60 04/25/2015 0558   WBC 6.3 04/25/2015 0558   HGB 12.3 (L) 04/25/2015 0558   HCT 36.8 (L) 04/25/2015 0558   PLT 201 04/25/2015 0558   Ht Readings from Last 2 Encounters:  06/18/16 _0  (1.727 m)  03/12/16 _1  (1.727 m)   Wt Readings from Last 2 Encounters:  06/18/16 218 lb 6.4 oz (99.1 kg)  03/12/16 216 lb 12.8 oz (98.3 kg)   There is no height or weight on file to calculate BMI. BP Readings from Last 3 Encounters:  06/18/16 (!) 151/78  03/12/16 120/80  02/04/16 (!) 118/94   A/P: Patient was  referred to [facility] for lisinopril, test strips, and lancets. Patient notified he needs to bring in copy of a bill with current mailing address.   The patient verbalized understanding of information provided by repeating back.   15 minutes spent face-to-face with the patient during the encounter. 50% of time spent on education. 50% of time was spent on assessment, plan, coordination of care.

## 2016-06-24 NOTE — Progress Notes (Signed)
Case discussed with Dr. Boswell at the time of the visit. We reviewed the resident's history and exam and pertinent patient test results. I agree with the assessment, diagnosis, and plan of care documented in the resident's note. 

## 2016-07-08 MED FILL — metFORMIN HCL 1000 MG TABS: 1000 | 30 days supply | Qty: 60 | Fill #0

## 2016-07-22 ENCOUNTER — Other Ambulatory Visit: Payer: Self-pay | Admitting: *Deleted

## 2016-07-22 DIAGNOSIS — E1165 Type 2 diabetes mellitus with hyperglycemia: Secondary | ICD-10-CM

## 2016-07-22 DIAGNOSIS — I6359 Cerebral infarction due to unspecified occlusion or stenosis of other cerebral artery: Secondary | ICD-10-CM

## 2016-07-22 DIAGNOSIS — E118 Type 2 diabetes mellitus with unspecified complications: Principal | ICD-10-CM

## 2016-07-22 DIAGNOSIS — IMO0002 Reserved for concepts with insufficient information to code with codable children: Secondary | ICD-10-CM

## 2016-07-23 MED ORDER — METFORMIN HCL 1000 MG PO TABS: 1000.0000 mg | ORAL_TABLET | Freq: Two times a day (BID) | ORAL | 2 refills | Status: DC

## 2016-07-23 MED ORDER — SITAGLIPTIN PHOSPHATE 100 MG PO TABS: 100.0000 mg | ORAL_TABLET | Freq: Every day | ORAL | 2 refills | Status: DC

## 2016-07-23 MED FILL — JANUVIA 100 MG TABLET: 100 | 30 days supply | Qty: 30 | Fill #0

## 2016-07-25 NOTE — Telephone Encounter (Signed)
Call made to [facility] to transfer both metformin and januvia rxs from [place] to [place] pharmacy.Regenia Skeeter, Darlene Cassady11/10/20178:24 AM

## 2016-07-29 MED FILL — metFORMIN HCL 1000 MG TABS: 1000 | 30 days supply | Qty: 60 | Fill #0

## 2016-08-04 ENCOUNTER — Other Ambulatory Visit: Payer: Self-pay | Admitting: Pharmacist

## 2016-08-04 DIAGNOSIS — IMO0002 Reserved for concepts with insufficient information to code with codable children: Secondary | ICD-10-CM

## 2016-08-04 DIAGNOSIS — E118 Type 2 diabetes mellitus with unspecified complications: Secondary | ICD-10-CM

## 2016-08-04 DIAGNOSIS — E1165 Type 2 diabetes mellitus with hyperglycemia: Secondary | ICD-10-CM

## 2016-08-04 DIAGNOSIS — I6359 Cerebral infarction due to unspecified occlusion or stenosis of other cerebral artery: Secondary | ICD-10-CM

## 2016-08-04 DIAGNOSIS — E119 Type 2 diabetes mellitus without complications: Secondary | ICD-10-CM

## 2016-08-04 DIAGNOSIS — I5032 Chronic diastolic (congestive) heart failure: Secondary | ICD-10-CM

## 2016-08-04 MED ORDER — LISINOPRIL 10 MG PO TABS: 10.0000 mg | ORAL_TABLET | Freq: Every day | ORAL | 0 refills | Status: DC

## 2016-08-04 MED ORDER — ATORVASTATIN CALCIUM 80 MG PO TABS: 80.0000 mg | ORAL_TABLET | Freq: Every day | ORAL | 0 refills | Status: DC

## 2016-08-04 MED ORDER — SITAGLIPTIN PHOSPHATE 100 MG PO TABS: 100.0000 mg | ORAL_TABLET | Freq: Every day | ORAL | 2 refills | Status: DC

## 2016-08-04 MED ORDER — CLOPIDOGREL BISULFATE 75 MG PO TABS: 75.0000 mg | ORAL_TABLET | Freq: Every day | ORAL | 0 refills | Status: DC

## 2016-08-04 MED ORDER — METFORMIN HCL 1000 MG PO TABS: 1000.0000 mg | ORAL_TABLET | Freq: Two times a day (BID) | ORAL | 2 refills | Status: DC

## 2016-08-04 NOTE — Progress Notes (Addendum)
Patient was approved for [facility] pharmacy, prescriptions transferred. Patient will continue to get clopidogrel from [facility] due to being non-formulary.

## 2016-08-13 MED ORDER — LISINOPRIL 10 MG PO TABS: 10.0000 mg | ORAL_TABLET | Freq: Every day | ORAL | 0 refills | Status: DC

## 2016-08-13 MED ORDER — METFORMIN HCL 1000 MG PO TABS: 1000.0000 mg | ORAL_TABLET | Freq: Two times a day (BID) | ORAL | 0 refills | Status: DC

## 2016-08-13 MED ORDER — SITAGLIPTIN PHOSPHATE 100 MG PO TABS: 100.0000 mg | ORAL_TABLET | Freq: Every day | ORAL | 0 refills | Status: DC

## 2016-08-13 MED ORDER — ATORVASTATIN CALCIUM 80 MG PO TABS: 80.0000 mg | ORAL_TABLET | Freq: Every day | ORAL | 0 refills | Status: DC

## 2016-08-13 MED ORDER — CLOPIDOGREL BISULFATE 75 MG PO TABS: 75.0000 mg | ORAL_TABLET | Freq: Every day | ORAL | 3 refills | Status: DC

## 2016-08-13 NOTE — Addendum Note (Signed)
Addended by: Forde Dandy on: 08/13/2016 05:46 PM   Modules accepted: Orders

## 2016-09-02 ENCOUNTER — Other Ambulatory Visit: Payer: Self-pay | Admitting: Pharmacist

## 2016-09-02 DIAGNOSIS — E119 Type 2 diabetes mellitus without complications: Secondary | ICD-10-CM

## 2016-09-02 MED ORDER — TRUE METRIX METER W/DEVICE KIT: 1.0000 | PACK | Freq: Four times a day (QID) | 0 refills | Status: AC

## 2016-09-02 MED ORDER — GLUCOSE BLOOD VI STRP: ORAL_STRIP | 12 refills | Status: DC

## 2016-09-02 MED FILL — TRUE METRIX GLUCOSE TEST ST: 25 days supply | Qty: 100 | Fill #0

## 2016-09-02 MED FILL — CLOPIDOGREL 75 MG TABLET: 75 | 30 days supply | Qty: 30 | Fill #0

## 2016-09-02 NOTE — Progress Notes (Signed)
Prescription transfer for meter and test strips

## 2016-09-17 ENCOUNTER — Ambulatory Visit (INDEPENDENT_AMBULATORY_CARE_PROVIDER_SITE_OTHER): Payer: Self-pay | Admitting: Internal Medicine

## 2016-09-17 ENCOUNTER — Encounter: Payer: Self-pay | Admitting: Internal Medicine

## 2016-09-17 VITALS — BP 149/87 | HR 79 | Temp 97.6°F | Ht 68.0 in | Wt 215.3 lb

## 2016-09-17 DIAGNOSIS — Z0000 Encounter for general adult medical examination without abnormal findings: Secondary | ICD-10-CM

## 2016-09-17 DIAGNOSIS — Z7984 Long term (current) use of oral hypoglycemic drugs: Secondary | ICD-10-CM

## 2016-09-17 DIAGNOSIS — E119 Type 2 diabetes mellitus without complications: Secondary | ICD-10-CM

## 2016-09-17 DIAGNOSIS — I10 Essential (primary) hypertension: Secondary | ICD-10-CM

## 2016-09-17 DIAGNOSIS — F603 Borderline personality disorder: Secondary | ICD-10-CM

## 2016-09-17 DIAGNOSIS — Z79899 Other long term (current) drug therapy: Secondary | ICD-10-CM

## 2016-09-17 LAB — HM DIABETES EYE EXAM

## 2016-09-17 LAB — POCT GLYCOSYLATED HEMOGLOBIN (HGB A1C): Hemoglobin A1C: 7.3

## 2016-09-17 LAB — GLUCOSE, CAPILLARY: Glucose-Capillary: 146 mg/dL — ABNORMAL HIGH (ref 65–99)

## 2016-09-17 MED ORDER — LISINOPRIL 10 MG PO TABS: 20.0000 mg | ORAL_TABLET | Freq: Every day | ORAL | 2 refills | Status: DC

## 2016-09-17 NOTE — Assessment & Plan Note (Signed)
Was having occasional outbursts of anger and was fixated on certain subjects at times. Started on Cymbalta at last visit but was unable to get the Rx filled with his medication issues. Today, both he and his sister report his symptoms are improved and no longer wish to pursue any therapy at this time.  Plan Will continue to monitor

## 2016-09-17 NOTE — Assessment & Plan Note (Addendum)
BP Readings from Last 3 Encounters:  09/17/16 (!) 144/93  06/18/16 (!) 151/78  03/12/16 120/80    Lab Results  Component Value Date   NA 139 04/25/2015   K 3.6 04/25/2015   CREATININE 0.83 04/25/2015    Assessment: BP today is 144/93. 149/87 on re-check. Currently on Lisinopril 10 mg daily. Does check occasionally at home 2-3 times a month. Says it is usually in the 120s/80s. Denies any symptoms of headaches, vision changes, nausea. BP goal of 130/80.   Plan: Increase Lisinopril to 20 mg daily RTC in 2 weeks for BP recheck and BMET

## 2016-09-17 NOTE — Assessment & Plan Note (Addendum)
Declined flu shot  Will be due for colonoscopy after 51th birthday in March - needs to work on getting orange card  Will need PNA vaccine due to his DM - will discuss at next visit.

## 2016-09-17 NOTE — Assessment & Plan Note (Addendum)
Lab Results  Component Value Date   HGBA1C 7.7 06/18/2016   HGBA1C 7.0 02/04/2016   HGBA1C 7.2 11/12/2015     Assessment: Had lost insurance and had been unable to get his medications at last visit. Dr. Maudie Mercury was able to get him set up with the [facility] program and he has been able to get his medications. He is currently on Metformin 1000 mg bid and Januvia100 mg daily. A1c on 06/2016 visit was 7.7. Since resuming his medications his A1c has improved to 7.3 today. Did not bring his meter today but reports it usually runs 130s in the morning (fasting). More variable in the afternoon and night between 140-160s. Denies any episodes of hypoglyecmia.  Due for DM eye exam today.   Plan: Will continue current medications Will arrange for DM eye exam F/U in 6 months

## 2016-09-17 NOTE — Progress Notes (Signed)
   CC: DM follow up  HPI:  [name] is a 51 y.o. male with a past medical history listed below here today for follow up of his DM and HTN.   Emotional instability - Was having occasional outbursts of anger and was fixated on certain subjects at times. Started on Cymbalta at last visit but was unable to get the Rx filled with his medication issues. Today, both he and his sister report his symptoms are improved and no longer wish to pursue any therapy at this time.  DM - Had lost insurance and had been unable to get his medications at last visit. Dr. Maudie Mercury was able to get him set up with the [facility] program and he has been able to get his medications. He is currently on  Metformin 1000 mg bid and Januvia100 mg daily. A1c on 06/2016 visit was 7.7. Since resuming his medications his A1c has improved to 7.3 today. Did not bring his meter today but reports it usually runs 130s in the morning (fasting). More variable in the afternoon and night between 140-160s. Denies any episodes of hypoglyecmia.  Due for DM eye exam today.   HTN -  BP today is 144/93. 149/87 on re-check. Currently on Lisinopril 10 mg daily. Does check occasionally at home 2-3 times a month. Says it is usually in the 120s/80s. Denies any symptoms of headaches, vision changes, nausea. BP goal of 130/80.    Past Medical History:  Diagnosis Date  . Brain tumor ([place])   . Diabetes mellitus without complication ([place]) 123456  . Hypertension   . Stroke [facility])     Review of Systems:   ROS  Negative except as mentioned above in HPI   Physical Exam:  Vitals:   09/17/16 1344 09/17/16 1421  BP: (!) 144/93 (!) 149/87  Pulse: 86 79  Temp: 97.6 F (36.4 C)   TempSrc: Oral   SpO2: 100%   Weight: 215 lb 4.8 oz (97.7 kg)   Height: 5\' 8"  (1.727 m)    Physical Exam  Constitutional: He is well-developed, well-nourished, and in no distress.  Cardiovascular: Normal rate, regular rhythm and normal heart sounds.     Pulmonary/Chest: Effort normal and breath sounds normal.  Abdominal: Soft. Bowel sounds are normal.  Skin: Skin is warm and dry.    Assessment & Plan:   See Encounters Tab for problem based charting.  Patient discussed with Dr. Lynnae January

## 2016-09-17 NOTE — Patient Instructions (Signed)
[name],  Your blood pressure is a little elevated today. I would like to increase the dose of your lisinopril to 20 mg daily and have you follow up in clinic for a BP recheck in 2 weeks.   We don't need to make any other changes today. We will get you set up to have your eye exam done.   Please return to see me in 6 months.

## 2016-09-19 NOTE — Progress Notes (Signed)
Internal Medicine Clinic Attending  Case discussed with Dr. Boswell soon after the resident saw the patient.  We reviewed the resident's history and exam and pertinent patient test results.  I agree with the assessment, diagnosis, and plan of care documented in the resident's note. 

## 2016-09-23 ENCOUNTER — Other Ambulatory Visit: Payer: Self-pay | Admitting: Internal Medicine

## 2016-09-23 NOTE — Telephone Encounter (Signed)
Patient has ACCU CHECK Meter and states that he needs test Strips for this meter not glucose blood (TRUE METRIX BLOOD GLUCOSE TEST) test strip  Please call the patient and confirm which is the right meter.

## 2016-09-25 MED FILL — CLOPIDOGREL 75 MG TABLET: 75 | 30 days supply | Qty: 30 | Fill #1

## 2016-09-26 ENCOUNTER — Other Ambulatory Visit: Payer: Self-pay | Admitting: *Deleted

## 2016-09-26 MED ORDER — GLUCOSE BLOOD VI STRP: ORAL_STRIP | 3 refills | Status: DC

## 2016-09-30 NOTE — Telephone Encounter (Signed)
This has been done.

## 2016-10-01 ENCOUNTER — Ambulatory Visit: Payer: Self-pay

## 2016-10-03 ENCOUNTER — Ambulatory Visit (INDEPENDENT_AMBULATORY_CARE_PROVIDER_SITE_OTHER): Payer: Self-pay | Admitting: Internal Medicine

## 2016-10-03 DIAGNOSIS — I10 Essential (primary) hypertension: Secondary | ICD-10-CM

## 2016-10-03 DIAGNOSIS — Z79899 Other long term (current) drug therapy: Secondary | ICD-10-CM

## 2016-10-03 MED ORDER — LISINOPRIL 10 MG PO TABS: 20.0000 mg | ORAL_TABLET | Freq: Every day | ORAL | 2 refills | Status: DC

## 2016-10-03 MED ORDER — LISINOPRIL 20 MG PO TABS: 20.0000 mg | ORAL_TABLET | Freq: Every day | ORAL | 2 refills | Status: DC

## 2016-10-03 NOTE — Assessment & Plan Note (Addendum)
BP Readings from Last 3 Encounters:  10/03/16 (!) 131/91  09/17/16 (!) 149/87  06/18/16 (!) 151/78    Lab Results  Component Value Date   NA 139 04/25/2015   K 3.6 04/25/2015   CREATININE 0.83 04/25/2015    Assessment: Paul Hanson was seen in clinic on 09/17/16 for his chronic medical problems. At that time, his blood pressure was mildly elevated at 144-149/87-93. He had been only lisinopril 10 mg daily only at that time. His lisinopril dose was increased from 10 mg to 20 mg daily at that time. He denies any symptoms today. No headaches, vision changes, nausea, chest pain, palpations or cough.   BP today is 131/91. Reports it is usually better than that at home.  Plan: Will continue current medications today Asked to check BP 2-3 times a week at home and keep a log with a goal <130/90. Asked to call clinic if consistently greater than this at home. Checking BMET today F/U in 3 months

## 2016-10-03 NOTE — Progress Notes (Signed)
Medicine attending: Medical history, presenting problems, physical findings, and medications, reviewed with resident physician Dr Nathan Boswell on the day of the patient visit and I concur with his evaluation and management plan. 

## 2016-10-03 NOTE — Progress Notes (Signed)
   CC: HTN  HPI:  [name] is a 51 y.o. male with a past medical history listed below here today for follow up of his HTN.   [name] was seen in clinic on 09/17/16 for his chronic medical problems. At that time, his blood pressure was mildly elevated at 144-149/87-93. He had been only lisinopril 10 mg daily only at that time. His lisinopril dose was increased from 10 mg to 20 mg daily at that time. He denies any symptoms today. No headaches, vision changes, nausea, chest pain, palpations or cough.   BP today is 131/91. Reports it is usually better than that at home.   Past Medical History:  Diagnosis Date  . Brain tumor ([place])   . Diabetes mellitus without complication ([place]) 123456  . Hypertension   . Stroke [facility])     Review of Systems:   Negative except as noted in HPI  Physical Exam:  Vitals:   10/03/16 1353  BP: (!) 131/91  Pulse: 89  Temp: (!) 91.4 F (33 C)  TempSrc: Oral  SpO2: 100%  Weight: 213 lb 1.6 oz (96.7 kg)  Height: 5\' 8"  (1.727 m)   Physical Exam  Constitutional: He is well-developed, well-nourished, and in no distress. No distress.  HENT:  Head: Normocephalic and atraumatic.  Cardiovascular: Normal rate, regular rhythm and normal heart sounds.   Pulmonary/Chest: Effort normal and breath sounds normal.  Skin: Skin is warm and dry.  Psychiatric: Mood and affect normal.     Assessment & Plan:   See Encounters Tab for problem based charting.  Patient discussed with Dr. Beryle Beams

## 2016-10-03 NOTE — Patient Instructions (Signed)
Paul Hanson,  Your blood pressure is doing better today. Please check your blood pressure 2-3 times a week at home and keep a log for [place] to bring to your next visit.  I would like to see you back in 2.5-3 months for follow up.

## 2016-10-04 LAB — BMP8+ANION GAP
ANION GAP: 18 mmol/L (ref 10.0–18.0)
BUN / CREAT RATIO: 16 (ref 9–20)
BUN: 14 mg/dL (ref 6–24)
CHLORIDE: 102 mmol/L (ref 96–106)
CO2: 21 mmol/L (ref 18–29)
CREATININE: 0.86 mg/dL (ref 0.76–1.27)
Calcium: 10.1 mg/dL (ref 8.7–10.2)
GFR calc Af Amer: 118 mL/min/{1.73_m2} (ref >59)
GFR calc non Af Amer: 102 mL/min/{1.73_m2} (ref >59)
GLUCOSE: 106 mg/dL — AB (ref 65–99)
POTASSIUM: 4.7 mmol/L (ref 3.5–5.2)
SODIUM: 141 mmol/L (ref 134–144)

## 2016-10-08 ENCOUNTER — Encounter: Payer: Self-pay | Admitting: *Deleted

## 2016-10-08 ENCOUNTER — Encounter: Payer: Self-pay | Admitting: Dietician

## 2016-10-27 MED FILL — CLOPIDOGREL 75 MG TABLET: 75 | 30 days supply | Qty: 30 | Fill #2

## 2016-11-07 ENCOUNTER — Other Ambulatory Visit: Payer: Self-pay | Admitting: Pharmacist

## 2016-11-07 DIAGNOSIS — E1165 Type 2 diabetes mellitus with hyperglycemia: Secondary | ICD-10-CM

## 2016-11-07 DIAGNOSIS — E119 Type 2 diabetes mellitus without complications: Secondary | ICD-10-CM

## 2016-11-07 DIAGNOSIS — I6359 Cerebral infarction due to unspecified occlusion or stenosis of other cerebral artery: Secondary | ICD-10-CM

## 2016-11-07 DIAGNOSIS — E118 Type 2 diabetes mellitus with unspecified complications: Secondary | ICD-10-CM

## 2016-11-07 DIAGNOSIS — IMO0002 Reserved for concepts with insufficient information to code with codable children: Secondary | ICD-10-CM

## 2016-11-08 MED ORDER — SITAGLIPTIN PHOSPHATE 100 MG PO TABS: 100.0000 mg | ORAL_TABLET | Freq: Every day | ORAL | 2 refills | Status: DC

## 2016-11-08 MED ORDER — ATORVASTATIN CALCIUM 80 MG PO TABS: 80.0000 mg | ORAL_TABLET | Freq: Every day | ORAL | 2 refills | Status: DC

## 2016-11-08 MED ORDER — METFORMIN HCL 1000 MG PO TABS: 1000.0000 mg | ORAL_TABLET | Freq: Two times a day (BID) | ORAL | 2 refills | Status: DC

## 2016-11-10 ENCOUNTER — Other Ambulatory Visit: Payer: Self-pay | Admitting: Internal Medicine

## 2016-11-10 DIAGNOSIS — I6359 Cerebral infarction due to unspecified occlusion or stenosis of other cerebral artery: Secondary | ICD-10-CM

## 2016-11-10 DIAGNOSIS — E119 Type 2 diabetes mellitus without complications: Secondary | ICD-10-CM

## 2016-11-12 ENCOUNTER — Emergency Department (HOSPITAL_BASED_OUTPATIENT_CLINIC_OR_DEPARTMENT_OTHER): Payer: Self-pay

## 2016-11-12 ENCOUNTER — Encounter (HOSPITAL_BASED_OUTPATIENT_CLINIC_OR_DEPARTMENT_OTHER): Payer: Self-pay | Admitting: Emergency Medicine

## 2016-11-12 ENCOUNTER — Emergency Department (HOSPITAL_BASED_OUTPATIENT_CLINIC_OR_DEPARTMENT_OTHER)
Admission: EM | Admit: 2016-11-12 | Discharge: 2016-11-12 | Disposition: A | Discharge status: Home / Self Care | Payer: Self-pay | Attending: Emergency Medicine | Admitting: Emergency Medicine

## 2016-11-12 DIAGNOSIS — Z85841 Personal history of malignant neoplasm of brain: Secondary | ICD-10-CM | POA: Insufficient documentation

## 2016-11-12 DIAGNOSIS — Z794 Long term (current) use of insulin: Secondary | ICD-10-CM | POA: Insufficient documentation

## 2016-11-12 DIAGNOSIS — Y998 Other external cause status: Secondary | ICD-10-CM | POA: Insufficient documentation

## 2016-11-12 DIAGNOSIS — W098XXA Fall on or from other playground equipment, initial encounter: Secondary | ICD-10-CM | POA: Insufficient documentation

## 2016-11-12 DIAGNOSIS — Y92009 Unspecified place in unspecified non-institutional (private) residence as the place of occurrence of the external cause: Secondary | ICD-10-CM | POA: Insufficient documentation

## 2016-11-12 DIAGNOSIS — Z79899 Other long term (current) drug therapy: Secondary | ICD-10-CM | POA: Insufficient documentation

## 2016-11-12 DIAGNOSIS — Y9344 Activity, trampolining: Secondary | ICD-10-CM | POA: Insufficient documentation

## 2016-11-12 DIAGNOSIS — I5032 Chronic diastolic (congestive) heart failure: Secondary | ICD-10-CM | POA: Insufficient documentation

## 2016-11-12 DIAGNOSIS — E119 Type 2 diabetes mellitus without complications: Secondary | ICD-10-CM | POA: Insufficient documentation

## 2016-11-12 DIAGNOSIS — I110 Hypertensive heart disease with heart failure: Secondary | ICD-10-CM | POA: Insufficient documentation

## 2016-11-12 DIAGNOSIS — S93601A Unspecified sprain of right foot, initial encounter: Secondary | ICD-10-CM | POA: Insufficient documentation

## 2016-11-12 NOTE — ED Provider Notes (Signed)
[place] DEPT [facility] Provider Note   CSN: [id number] Arrival date & time: 11/12/16  1131     History   Chief Complaint Chief Complaint  Patient presents with  . Foot Injury    HPI Paul Hanson is a 51 y.o. male.  Patient is a 51 year old male with history of diabetes, hypertension. He presents for evaluation of a right foot and ankle injury. He states he was stepping down from a trampoline yesterday when he injured his foot and ankle. It has been painful and swollen since that time. Pain is worse with ambulation and relieved with rest.   The history is provided by the patient.  Foot Injury   The incident occurred yesterday. The incident occurred at home. The injury mechanism was a fall. Pain location: Right foot and ankle. The pain is moderate. The pain has been constant since onset. Associated symptoms include inability to bear weight. The symptoms are aggravated by activity, bearing weight and palpation.    Past Medical History:  Diagnosis Date  . Brain tumor ([place])   . Diabetes mellitus without complication ([place]) 5284  . Hypertension   . Stroke [facility])     Patient Active Problem List   Diagnosis Date Noted  . Emotional instability 06/21/2016  . Preventative health care 07/30/2015  . Diabetes mellitus type 2 in nonobese ([place]) 05/03/2015  . CVA (cerebral vascular accident) ([place]) 04/23/2015  . Chronic diastolic heart failure ([place]) 04/23/2015  . Left hemiparesis ([place])   . Hypertension 04/22/2015  . History of brain tumor 04/22/2015    Past Surgical History:  Procedure Laterality Date  . BRAIN SURGERY    . CRANIOTOMY         Home Medications    Prior to Admission medications   Medication Sig Start Date End Date Taking? Authorizing Provider  atorvastatin (LIPITOR) 80 MG tablet Take 1 tablet (80 mg total) by mouth daily. 11/08/16   Maryellen Pile, MD  baclofen (LIORESAL) 10 MG tablet Take 0.5 tablets (5 mg total) by mouth 3 (three) times daily. IM program  06/18/16   Maryellen Pile, MD  Blood Glucose Monitoring Suppl (TRUE METRIX METER) w/Device KIT 1 Dose by Does not apply route 4 (four) times daily. 09/02/16   Maryellen Pile, MD  carbamide peroxide (DEBROX) 6.5 % otic solution Place 5 drops into both ears 2 (two) times daily. 02/04/16 02/03/17  Corky Sox, MD  clopidogrel (PLAVIX) 75 MG tablet Take 1 tablet (75 mg total) by mouth daily. IM program 08/13/16   Maryellen Pile, MD  glucose blood (ACCU-CHEK SMARTVIEW) test strip Use to check blood sugar 3 to 4 times daily. diag code E11.9. Insulin dependent 09/26/16   Maryellen Pile, MD  glucose blood (TRUE METRIX BLOOD GLUCOSE TEST) test strip Use as instructed up to 4 times daily 09/02/16   Maryellen Pile, MD  Insulin Syringe-Needle U-100 (INSULIN SYRINGE .3CC/29GX1/2") 29G X 1/2" 0.3 ML MISC As directed 10/26/15   Sid Falcon, MD  lisinopril (PRINIVIL,ZESTRIL) 20 MG tablet Take 1 tablet (20 mg total) by mouth daily. 10/03/16   Maryellen Pile, MD  metFORMIN (GLUCOPHAGE) 1000 MG tablet Take 1 tablet (1,000 mg total) by mouth 2 (two) times daily with a meal. 11/08/16 11/08/17  Maryellen Pile, MD  sitaGLIPtin (JANUVIA) 100 MG tablet Take 1 tablet (100 mg total) by mouth daily. 11/08/16   Maryellen Pile, MD    Family History Family History  Problem Relation Age of Onset  . Heart disease Mother   . Hyperlipidemia Father   .  Hypertension Father     Social History Social History  Substance Use Topics  . Smoking status: Never Smoker  . Smokeless tobacco: Never Used  . Alcohol use 0.6 - 1.2 oz/week    1 - 2 Shots of liquor per week     Comment: 3-4 times a year     Allergies   Patient has no known allergies.   Review of Systems Review of Systems  All other systems reviewed and are negative.    Physical Exam Updated Vital Signs BP 147/93 (BP Location: Right Arm)   Pulse 89   Temp 98 F (36.7 C) (Oral)   Resp 18   Ht _0  (1.727 m)   Wt 213 lb (96.6 kg)   SpO2 96%   BMI 32.39 kg/m     Physical Exam  Constitutional: He is oriented to person, place, and time. He appears well-developed and well-nourished. No distress.  HENT:  Head: Normocephalic and atraumatic.  Neck: Normal range of motion. Neck supple.  Musculoskeletal: Normal range of motion.  There is swelling to the right foot extending into the lateral aspect of the ankle. The majority of the tenderness is located at the proximal lateral dorsal foot. PMS is intact and compartments are soft.  Neurological: He is alert and oriented to person, place, and time.  Skin: Skin is warm and dry. He is not diaphoretic.  Nursing note and vitals reviewed.    ED Treatments / Results  Labs (all labs ordered are listed, but only abnormal results are displayed) Labs Reviewed - No data to display  EKG  EKG Interpretation None       Radiology No results found.  Procedures Procedures (including critical care time)  Medications Ordered in ED Medications - No data to display   Initial Impression / Assessment and Plan / ED Course  I have reviewed the triage vital signs and the nursing notes.  Pertinent labs & imaging results that were available during my care of the patient were reviewed by me and considered in my medical decision making (see chart for details).  X-rays are negative for fracture. This will be treated as a sprain. To follow-up as needed if not improving in the next 1-2 weeks.  Final Clinical Impressions(s) / ED Diagnoses   Final diagnoses:  None    New Prescriptions New Prescriptions   No medications on file     Veryl Speak, MD 11/12/16 1220

## 2016-11-12 NOTE — ED Notes (Signed)
Slight swelling noted to right foot, CMS intact

## 2016-11-12 NOTE — Discharge Instructions (Signed)
Keep your foot elevated.  Ice for 20 minutes every 2 hours while awake for the next 2 days.  Follow-up with your primary Dr. if you're not improving in the next week.

## 2016-11-12 NOTE — ED Triage Notes (Signed)
Reports injury to right foot while trying to go down steps to get off the trampoline yesterday.

## 2016-11-20 MED FILL — TRUE METRIX GLUCOSE TEST ST: 25 days supply | Qty: 100 | Fill #1

## 2016-12-01 MED FILL — CLOPIDOGREL 75 MG TABLET: 75 | 30 days supply | Qty: 30 | Fill #3

## 2016-12-25 ENCOUNTER — Other Ambulatory Visit: Payer: Self-pay | Admitting: Pharmacist

## 2016-12-25 DIAGNOSIS — E119 Type 2 diabetes mellitus without complications: Secondary | ICD-10-CM

## 2016-12-25 DIAGNOSIS — I6359 Cerebral infarction due to unspecified occlusion or stenosis of other cerebral artery: Secondary | ICD-10-CM

## 2016-12-25 MED ORDER — CLOPIDOGREL BISULFATE 75 MG PO TABS: 75.0000 mg | ORAL_TABLET | Freq: Every day | ORAL | 3 refills | Status: DC

## 2016-12-25 MED ORDER — GLUCOSE BLOOD VI STRP: ORAL_STRIP | 12 refills | Status: AC

## 2016-12-25 MED FILL — CLOPIDOGREL 75 MG TABLET: 75 | 30 days supply | Qty: 30 | Fill #0

## 2016-12-25 MED FILL — TRUE METRIX GLUCOSE TEST ST: 25 days supply | Qty: 100 | Fill #0

## 2016-12-25 NOTE — Addendum Note (Signed)
Addended by: Forde Dandy on: 12/25/2016 03:30 PM   Modules accepted: Orders

## 2016-12-31 ENCOUNTER — Encounter: Payer: Self-pay | Admitting: Internal Medicine

## 2016-12-31 ENCOUNTER — Ambulatory Visit (INDEPENDENT_AMBULATORY_CARE_PROVIDER_SITE_OTHER): Payer: Self-pay | Admitting: Internal Medicine

## 2016-12-31 VITALS — BP 155/80 | HR 64 | Temp 98.2°F | Ht 68.0 in | Wt 213.9 lb

## 2016-12-31 DIAGNOSIS — E119 Type 2 diabetes mellitus without complications: Secondary | ICD-10-CM

## 2016-12-31 DIAGNOSIS — I10 Essential (primary) hypertension: Secondary | ICD-10-CM

## 2016-12-31 DIAGNOSIS — Z8673 Personal history of transient ischemic attack (TIA), and cerebral infarction without residual deficits: Secondary | ICD-10-CM

## 2016-12-31 DIAGNOSIS — Z7984 Long term (current) use of oral hypoglycemic drugs: Secondary | ICD-10-CM

## 2016-12-31 DIAGNOSIS — R4189 Other symptoms and signs involving cognitive functions and awareness: Secondary | ICD-10-CM

## 2016-12-31 DIAGNOSIS — Z23 Encounter for immunization: Secondary | ICD-10-CM

## 2016-12-31 DIAGNOSIS — Z85841 Personal history of malignant neoplasm of brain: Secondary | ICD-10-CM

## 2016-12-31 DIAGNOSIS — Z0000 Encounter for general adult medical examination without abnormal findings: Secondary | ICD-10-CM

## 2016-12-31 DIAGNOSIS — R404 Transient alteration of awareness: Secondary | ICD-10-CM | POA: Insufficient documentation

## 2016-12-31 DIAGNOSIS — Z79899 Other long term (current) drug therapy: Secondary | ICD-10-CM

## 2016-12-31 LAB — POCT GLYCOSYLATED HEMOGLOBIN (HGB A1C): Hemoglobin A1C: 6.9

## 2016-12-31 LAB — GLUCOSE, CAPILLARY: GLUCOSE-CAPILLARY: 93 mg/dL (ref 65–99)

## 2016-12-31 MED ORDER — LISINOPRIL 40 MG PO TABS: 40.0000 mg | ORAL_TABLET | Freq: Every day | ORAL | 2 refills | Status: DC

## 2016-12-31 NOTE — Assessment & Plan Note (Addendum)
Lab Results  Component Value Date   HGBA1C 7.3 09/17/2016   HGBA1C 7.7 06/18/2016   HGBA1C 7.0 02/04/2016    DM has been well controlled since he was first diagnosed. Currently taking Metformin 1000 mg bid and Januvia 100 mg daily. Did not bring his meter today. Says his blood sugars have been running in the 120-140s. Denies any hypoglycemia.   DM eye exam 09/2016 with no DM retinopathy.   A1c today was 6.9.   Assessment: well controlled DM type II  Plan: Continue current medications

## 2016-12-31 NOTE — Assessment & Plan Note (Signed)
PNA 23 vaccine given today Will refer to GI for screening colonoscopy

## 2016-12-31 NOTE — Patient Instructions (Signed)
Paul Hanson,   I am going to go up on your lisinopril to 40 mg today.  We will get you scheduled to have an MRI of your brain as well as to see a neurologist.  Please follow up with me in 1 month.

## 2016-12-31 NOTE — Progress Notes (Signed)
   CC: DM, HTN follow up  HPI:  [name] is a 51 y.o. male with a past medical history listed below here today for follow up of his DM and HTN.    For details of today's visit and the status of his chronic medical issues please refer to the assessment and plan.   Past Medical History:  Diagnosis Date  . Brain tumor ([place])   . Diabetes mellitus without complication ([place]) 3943  . Hypertension   . Stroke [facility])     Review of Systems:   See HPI  Physical Exam:  Vitals:   12/31/16 1323  BP: (!) 155/80  Pulse: 64  Temp: 98.2 F (36.8 C)  TempSrc: Oral  SpO2: 100%  Weight: 213 lb 14.4 oz (97 kg)  Height: 5\' 8"  (1.727 m)   Physical Exam  Constitutional: He is oriented to person, place, and time and well-developed, well-nourished, and in no distress.  Cardiovascular: Normal rate and regular rhythm.   Pulmonary/Chest: Effort normal and breath sounds normal.  Neurological: He is alert and oriented to person, place, and time.  Vitals reviewed.    Assessment & Plan:   See Encounters Tab for problem based charting.  Patient discussed with Dr. Lynnae January

## 2016-12-31 NOTE — Assessment & Plan Note (Addendum)
Paul Hanson is accompanied by is fiance and mother today. They report that he has eben having staring spells at home. They state that they have witnessed 3-4 episodes since January. Describe these episodes as him staring off into space and being unresponsive to voice for less than 15 seconds. When he snaps out of it he has no recollection of the event. No bowel or bladder incontinence. No overt seizure activity. No post ictal state.   Of note, he does have a history of brain tumor with resection in 1991 as well as CVA in 2016. Both potential nidus for seizure activity.    Assessment: unresponsive episodes  Plan: concerning for seizure activity. Will arrange for brain MRI and referral to neurology for evaluation and possible EEG.

## 2016-12-31 NOTE — Assessment & Plan Note (Addendum)
BP Readings from Last 3 Encounters:  12/31/16 (!) 155/80  11/12/16 147/93  10/03/16 (!) 131/91    Lab Results  Component Value Date   NA 141 10/03/2016   K 4.7 10/03/2016   CREATININE 0.86 10/03/2016   BP today is 155/80. Reports BP has been running around 145/80 at home.   Currently on Lisinopril 20 mg daily. Reports compliance. Denies any side effects today.   Assessment: worsened HTN  Plan: Increase lisinopril to 40 mg daily rtc in 1 month

## 2017-01-01 ENCOUNTER — Encounter: Payer: Self-pay | Admitting: Gastroenterology

## 2017-01-01 NOTE — Progress Notes (Signed)
Internal Medicine Clinic Attending  Case discussed with Dr. Boswell at the time of the visit.  We reviewed the resident's history and exam and pertinent patient test results.  I agree with the assessment, diagnosis, and plan of care documented in the resident's note.  

## 2017-01-05 ENCOUNTER — Ambulatory Visit: Payer: Self-pay | Admitting: Neurology

## 2017-01-12 ENCOUNTER — Ambulatory Visit (HOSPITAL_COMMUNITY)
Admission: RE | Admit: 2017-01-12 | Discharge: 2017-01-12 | Disposition: A | Discharge status: Home / Self Care | Payer: Self-pay | Source: Ambulatory Visit | Attending: Internal Medicine | Admitting: Internal Medicine

## 2017-01-12 DIAGNOSIS — G9389 Other specified disorders of brain: Secondary | ICD-10-CM | POA: Insufficient documentation

## 2017-01-12 DIAGNOSIS — I739 Peripheral vascular disease, unspecified: Secondary | ICD-10-CM | POA: Insufficient documentation

## 2017-01-12 DIAGNOSIS — R4189 Other symptoms and signs involving cognitive functions and awareness: Secondary | ICD-10-CM | POA: Insufficient documentation

## 2017-01-12 DIAGNOSIS — M899 Disorder of bone, unspecified: Secondary | ICD-10-CM | POA: Insufficient documentation

## 2017-02-03 NOTE — Addendum Note (Signed)
Addended by: Hulan Fray on: 02/03/2017 08:15 PM   Modules accepted: Orders

## 2017-02-04 ENCOUNTER — Encounter: Payer: Self-pay | Admitting: Internal Medicine

## 2017-02-04 ENCOUNTER — Ambulatory Visit (INDEPENDENT_AMBULATORY_CARE_PROVIDER_SITE_OTHER): Payer: Self-pay | Admitting: Internal Medicine

## 2017-02-04 VITALS — BP 144/81 | HR 78 | Temp 97.8°F | Ht 69.0 in | Wt 215.6 lb

## 2017-02-04 DIAGNOSIS — Z79899 Other long term (current) drug therapy: Secondary | ICD-10-CM

## 2017-02-04 DIAGNOSIS — I10 Essential (primary) hypertension: Secondary | ICD-10-CM

## 2017-02-04 DIAGNOSIS — R4189 Other symptoms and signs involving cognitive functions and awareness: Secondary | ICD-10-CM

## 2017-02-04 DIAGNOSIS — N529 Male erectile dysfunction, unspecified: Secondary | ICD-10-CM

## 2017-02-04 LAB — GLUCOSE, CAPILLARY: Glucose-Capillary: 133 mg/dL — ABNORMAL HIGH (ref 65–99)

## 2017-02-04 MED ORDER — HYDROCHLOROTHIAZIDE 25 MG PO TABS: 25.0000 mg | ORAL_TABLET | Freq: Every day | ORAL | 1 refills | Status: DC

## 2017-02-04 MED FILL — HYDROCHLOROTHIAZIDE 25 MG T: 25 | 30 days supply | Qty: 30 | Fill #0

## 2017-02-04 NOTE — Progress Notes (Signed)
   CC: HTN follow up  HPI:  Mr.Kayleb Nemetz is a 51 y.o. male with a past medical history listed below here today for follow up of his HTN.  For details of today's visit and the status of his chronic medical issues please refer to the assessment and plan.   Past Medical History:  Diagnosis Date  . Brain tumor ([place])   . Diabetes mellitus without complication ([place]) 7841  . Hypertension   . Stroke [facility])     Review of Systems:   No chest pain or shortness of breath  Physical Exam:  Vitals:   02/04/17 1456  BP: (!) 144/81  Pulse: 78  Temp: 97.8 F (36.6 C)  TempSrc: Oral  SpO2: 99%  Weight: 215 lb 9.6 oz (97.8 kg)  Height: 5\' 9"  (1.753 m)   Physical Exam  Constitutional: He is oriented to person, place, and time and well-developed, well-nourished, and in no distress.  Cardiovascular: Normal rate and regular rhythm.   Pulmonary/Chest: Effort normal and breath sounds normal.  Neurological: He is alert and oriented to person, place, and time.  Skin: Skin is warm and dry.  Psychiatric: Mood and affect normal.  Vitals reviewed.    Assessment & Plan:   See Encounters Tab for problem based charting.  Patient discussed with Dr. Daryll Drown

## 2017-02-04 NOTE — Patient Instructions (Signed)
Mr. Konecny,  We will check some blood work today.  I am going to start you on a new medication for your blood pressure, HCTZ.  Please follow up with me in August

## 2017-02-05 LAB — LIPID PANEL
CHOL/HDL RATIO: 4.3 ratio (ref 0.0–5.0)
Cholesterol, Total: 142 mg/dL (ref 100–199)
HDL: 33 mg/dL — ABNORMAL LOW (ref >39)
LDL CALC: 95 mg/dL (ref 0–99)
Triglycerides: 71 mg/dL (ref 0–149)
VLDL Cholesterol Cal: 14 mg/dL (ref 5–40)

## 2017-02-05 LAB — CMP14 + ANION GAP
A/G RATIO: 1.7 (ref 1.2–2.2)
ALT: 12 IU/L (ref 0–44)
AST: 13 IU/L (ref 0–40)
Albumin: 4.8 g/dL (ref 3.5–5.5)
Alkaline Phosphatase: 65 IU/L (ref 39–117)
Anion Gap: 17 mmol/L (ref 10.0–18.0)
BILIRUBIN TOTAL: 0.8 mg/dL (ref 0.0–1.2)
BUN/Creatinine Ratio: 14 (ref 9–20)
BUN: 11 mg/dL (ref 6–24)
CALCIUM: 10.5 mg/dL — AB (ref 8.7–10.2)
CHLORIDE: 100 mmol/L (ref 96–106)
CO2: 24 mmol/L (ref 18–29)
Creatinine, Ser: 0.81 mg/dL (ref 0.76–1.27)
GFR calc non Af Amer: 104 mL/min/{1.73_m2} (ref >59)
GFR, EST AFRICAN AMERICAN: 120 mL/min/{1.73_m2} (ref >59)
GLOBULIN, TOTAL: 2.8 g/dL (ref 1.5–4.5)
Glucose: 127 mg/dL — ABNORMAL HIGH (ref 65–99)
POTASSIUM: 4.6 mmol/L (ref 3.5–5.2)
SODIUM: 141 mmol/L (ref 134–144)
TOTAL PROTEIN: 7.6 g/dL (ref 6.0–8.5)

## 2017-02-05 LAB — TSH: TSH: 2.94 u[IU]/mL (ref 0.450–4.500)

## 2017-02-09 DIAGNOSIS — N529 Male erectile dysfunction, unspecified: Secondary | ICD-10-CM | POA: Insufficient documentation

## 2017-02-09 NOTE — Assessment & Plan Note (Signed)
Reports he could not afford the co-pay for the neurologist and cancelled the appointment. MRI done which was unremarkable. Denies any further episodes of unresponsiveness.   Plan: Continue to monitor

## 2017-02-09 NOTE — Assessment & Plan Note (Addendum)
Reports episode of erectile dysfunciton in the past month. States Unable to recall the last time he was able to get an erection. Reports at least 2 months. Denies any nocturnal tumescence. No issues with desire or libido.   Plan: TSH, CMP, lipid panel all unremarkable today.  Last A1c 6.9 Will return for am testosterone level   Addendum Total testosterone level low as 221.  Will check repeat Testosterone level as well as FSH, LH and prolactin.

## 2017-02-09 NOTE — Assessment & Plan Note (Signed)
BP Readings from Last 3 Encounters:  02/04/17 (!) 144/81  12/31/16 (!) 155/80  11/12/16 147/93    Lab Results  Component Value Date   NA 141 02/04/2017   K 4.6 02/04/2017   CREATININE 0.81 02/04/2017   BP 144/81 today. Currently on lisinopril 40 mg daily (inc from 20 mg last visit). Doing well, denies any side effects. Does not check BP at home.   Assessment: HTN  Plan: Start HCTZ 25 mg daily BMET today unremarkable RTC in 1 month for follow up

## 2017-02-11 NOTE — Progress Notes (Signed)
Internal Medicine Clinic Attending  Case discussed with Dr. Boswell at the time of the visit.  We reviewed the resident's history and exam and pertinent patient test results.  I agree with the assessment, diagnosis, and plan of care documented in the resident's note.  

## 2017-02-12 ENCOUNTER — Other Ambulatory Visit (INDEPENDENT_AMBULATORY_CARE_PROVIDER_SITE_OTHER): Payer: Self-pay

## 2017-02-12 ENCOUNTER — Encounter: Payer: Self-pay | Admitting: Gastroenterology

## 2017-02-12 ENCOUNTER — Ambulatory Visit (INDEPENDENT_AMBULATORY_CARE_PROVIDER_SITE_OTHER): Payer: Self-pay | Admitting: Gastroenterology

## 2017-02-12 ENCOUNTER — Other Ambulatory Visit: Payer: Self-pay | Admitting: *Deleted

## 2017-02-12 ENCOUNTER — Telehealth: Payer: Self-pay | Admitting: Emergency Medicine

## 2017-02-12 VITALS — BP 140/84 | HR 88 | Ht 69.0 in | Wt 210.0 lb

## 2017-02-12 DIAGNOSIS — N529 Male erectile dysfunction, unspecified: Secondary | ICD-10-CM

## 2017-02-12 DIAGNOSIS — Z7902 Long term (current) use of antithrombotics/antiplatelets: Secondary | ICD-10-CM

## 2017-02-12 DIAGNOSIS — I6359 Cerebral infarction due to unspecified occlusion or stenosis of other cerebral artery: Secondary | ICD-10-CM

## 2017-02-12 DIAGNOSIS — E119 Type 2 diabetes mellitus without complications: Secondary | ICD-10-CM

## 2017-02-12 DIAGNOSIS — Z1211 Encounter for screening for malignant neoplasm of colon: Secondary | ICD-10-CM

## 2017-02-12 MED ORDER — NA SULFATE-K SULFATE-MG SULF 17.5-3.13-1.6 GM/177ML PO SOLN: 1.0000 | ORAL | 0 refills | Status: DC

## 2017-02-12 MED FILL — CLOPIDOGREL 75 MG TABLET: 75 | 30 days supply | Qty: 30 | Fill #1

## 2017-02-12 MED FILL — JANUVIA 100 MG TABLET: 100 | 30 days supply | Qty: 30 | Fill #1

## 2017-02-12 NOTE — Progress Notes (Signed)
02/12/2017 Paul Hanson [id number] 28-Dec-1965   HISTORY OF PRESENT ILLNESS:  This is a 50 year old male who is new to our practice.  He is here today because he has been scheduled for screening colonoscopy with Dr. Loletha Carrow but is on Plavix.  He has never undergone colonoscopy in the past.  He does not have any GI complaints.  No FH colon cancer.  Patient is on Plavix since about 2104 for history of CVA.  The Plavix is prescribed by his PCP, Dr. Charlynn Grimes.   Past Medical History:  Diagnosis Date  . Brain tumor ([place])   . Diabetes mellitus without complication ([place]) 6269  . Hypertension   . Stroke [facility])    Past Surgical History:  Procedure Laterality Date  . BRAIN SURGERY    . CRANIOTOMY      reports that he has never smoked. He has never used smokeless tobacco. He reports that he drinks about 0.6 - 1.2 oz of alcohol per week . He reports that he does not use drugs. family history includes Heart disease in his mother; Hyperlipidemia in his father; Hypertension in his father. No Known Allergies    Outpatient Encounter Prescriptions as of 02/12/2017  Medication Sig  . atorvastatin (LIPITOR) 80 MG tablet Take 1 tablet (80 mg total) by mouth daily.  . Blood Glucose Monitoring Suppl (TRUE METRIX METER) w/Device KIT 1 Dose by Does not apply route 4 (four) times daily.  . clopidogrel (PLAVIX) 75 MG tablet Take 1 tablet (75 mg total) by mouth daily. IM program  . glucose blood (ACCU-CHEK SMARTVIEW) test strip Use to check blood sugar 3 to 4 times daily. diag code E11.9. Insulin dependent  . glucose blood (TRUE METRIX BLOOD GLUCOSE TEST) test strip Use as instructed up to 4 times daily. IM program  . hydrochlorothiazide (HYDRODIURIL) 25 MG tablet Take 1 tablet (25 mg total) by mouth daily.  Marland Kitchen lisinopril (PRINIVIL,ZESTRIL) 40 MG tablet Take 1 tablet (40 mg total) by mouth daily.  . metFORMIN (GLUCOPHAGE) 1000 MG tablet Take 1 tablet (1,000 mg total) by mouth 2 (two) times daily with a meal.    . sitaGLIPtin (JANUVIA) 100 MG tablet Take 1 tablet (100 mg total) by mouth daily.  . [DISCONTINUED] baclofen (LIORESAL) 10 MG tablet Take 0.5 tablets (5 mg total) by mouth 3 (three) times daily. IM program (Patient not taking: Reported on 02/12/2017)  . [DISCONTINUED] Insulin Syringe-Needle U-100 (INSULIN SYRINGE .3CC/29GX1/2") 29G X 1/2" 0.3 ML MISC As directed (Patient not taking: Reported on 02/12/2017)   No facility-administered encounter medications on file as of 02/12/2017.      REVIEW OF SYSTEMS  : All other systems reviewed and negative except where noted in the History of Present Illness.   PHYSICAL EXAM: BP 140/84   Pulse 88   Ht '5\' 9"'$  (1.753 m)   Wt 210 lb (95.3 kg)   SpO2 99%   BMI 31.01 kg/m  General: Well developed white male in no acute distress Head: Normocephalic and atraumatic Eyes:  Sclerae anicteric, conjunctiva pink. Ears: Normal auditory acuity Lungs: Clear throughout to auscultation; no increased WOB. Heart: Regular rate and rhythm Abdomen: Soft, non-distended. Normal bowel sounds.  Non-tender. Rectal:  Will be done at the time of colonoscopy. Musculoskeletal: Symmetrical with no gross deformities  Skin: No lesions on visible extremities Extremities: No edema  Neurological: Alert oriented x 4, grossly non-focal Psychological:  Alert and cooperative. Normal mood and affect  ASSESSMENT AND PLAN: -Screening colonoscopy:  Already  scheduled with Dr. Loletha Carrow. -Chronic antiplatelet use:  Hold Plavix for 5 days before procedure - will instruct when and how to resume after procedure. Risks and benefits of procedure including bleeding, perforation, infection, missed lesions, medication reactions and possible hospitalization or surgery if complications occur explained. Additional rare but real risk of cardiovascular event such as heart attack or ischemia/infarct of other organs off of Plavix explained and need to seek urgent help if this occurs. Will communicate by  phone or EMR with patient's prescribing provider, Dr. Charlynn Grimes, to that confirm holding Plavix is reasonable in this case.    CC:  Maryellen Pile, MD

## 2017-02-12 NOTE — Progress Notes (Signed)
Thank you for sending this case to me. I have reviewed the entire note, and the outlined plan seems appropriate.   Henry Danis, MD  

## 2017-02-12 NOTE — Telephone Encounter (Signed)
   Paul Hanson Aug 25, 1966 [id number]  Dear Dr. Charlynn Grimes:  We have scheduled the above named patient for a(n) colonoscopy procedure. Our records show that (s)he is on anticoagulation therapy.  Please advise as to whether the patient may come off their therapy of Plavix 5 days prior to their procedure which is scheduled for 03-05-17.  Please route your response to Tinnie Gens, CMA or fax response to [phone].  Sincerely,    [facility]

## 2017-02-12 NOTE — Telephone Encounter (Signed)
Pt states it will take about 2 weeks to receive Januvia from [facility] -requesting new rx be sent to [facility]. Completely out. Please include "IM Program" on rx.  Thanks Also wanted refill on Plavix - I called [facility], pt has refills - called pt; no answer, left message.

## 2017-02-12 NOTE — Patient Instructions (Signed)

## 2017-02-13 LAB — TESTOSTERONE: Testosterone: 221 ng/dL — ABNORMAL LOW (ref 264–916)

## 2017-02-16 MED ORDER — SITAGLIPTIN PHOSPHATE 100 MG PO TABS: 100.0000 mg | ORAL_TABLET | Freq: Every day | ORAL | 3 refills | Status: DC

## 2017-02-16 NOTE — Telephone Encounter (Signed)
He can come off the Plavix 5 days prior to the procedure.   Thanks

## 2017-02-16 NOTE — Addendum Note (Signed)
Addended by: [place] Lions on: 02/16/2017 04:46 PM   Modules accepted: Orders

## 2017-03-03 ENCOUNTER — Other Ambulatory Visit: Payer: Self-pay | Admitting: Emergency Medicine

## 2017-03-03 MED ORDER — NA SULFATE-K SULFATE-MG SULF 17.5-3.13-1.6 GM/177ML PO SOLN: 1.0000 | ORAL | 0 refills | Status: DC

## 2017-03-04 MED FILL — HYDROCHLOROTHIAZIDE 25 MG T: 25 | 30 days supply | Qty: 30 | Fill #1

## 2017-03-05 ENCOUNTER — Telehealth: Payer: Self-pay | Admitting: Gastroenterology

## 2017-03-05 ENCOUNTER — Encounter: Payer: Self-pay | Admitting: Gastroenterology

## 2017-03-05 NOTE — Telephone Encounter (Signed)
Spoke with pt. He states he does not have insurance. Pt was advised to come to the 4th floor for a sample of Suprep He states he will come to our office in the am. Suprep sample lot# [id number], Exp 05/20 was left at the front desk. He will call back if he has further questions.

## 2017-03-06 ENCOUNTER — Other Ambulatory Visit: Payer: Self-pay | Admitting: Emergency Medicine

## 2017-03-09 ENCOUNTER — Ambulatory Visit (AMBULATORY_SURGERY_CENTER): Payer: Self-pay | Admitting: Gastroenterology

## 2017-03-09 ENCOUNTER — Encounter: Payer: Self-pay | Admitting: Gastroenterology

## 2017-03-09 VITALS — BP 116/71 | HR 79 | Temp 98.2°F | Resp 19 | Ht 69.0 in | Wt 210.0 lb

## 2017-03-09 DIAGNOSIS — Z1212 Encounter for screening for malignant neoplasm of rectum: Secondary | ICD-10-CM

## 2017-03-09 DIAGNOSIS — Z1211 Encounter for screening for malignant neoplasm of colon: Secondary | ICD-10-CM

## 2017-03-09 MED ORDER — SODIUM CHLORIDE 0.9 % IV SOLN: 500.0000 mL | INTRAVENOUS | Status: DC

## 2017-03-09 NOTE — Patient Instructions (Signed)
YOU HAD AN ENDOSCOPIC PROCEDURE TODAY AT [facility]:   Refer to the procedure report that was given to you for any specific questions about what was found during the examination.  If the procedure report does not answer your questions, please call your gastroenterologist to clarify.  If you requested that your care partner not be given the details of your procedure findings, then the procedure report has been included in a sealed envelope for you to review at your convenience later.  YOU SHOULD EXPECT: Some feelings of bloating in the abdomen. Passage of more gas than usual.  Walking can help get rid of the air that was put into your GI tract during the procedure and reduce the bloating. If you had a lower endoscopy (such as a colonoscopy or flexible sigmoidoscopy) you may notice spotting of blood in your stool or on the toilet paper. If you underwent a bowel prep for your procedure, you may not have a normal bowel movement for a few days.  Please Note:  You might notice some irritation and congestion in your nose or some drainage.  This is from the oxygen used during your procedure.  There is no need for concern and it should clear up in a day or so.  SYMPTOMS TO REPORT IMMEDIATELY:   Following lower endoscopy (colonoscopy or flexible sigmoidoscopy):  Excessive amounts of blood in the stool  Significant tenderness or worsening of abdominal pains  Swelling of the abdomen that is new, acute  Fever of 100F or higher   Following upper endoscopy (EGD)  Vomiting of blood or coffee ground material  New chest pain or pain under the shoulder blades  Painful or persistently difficult swallowing  New shortness of breath  Fever of 100F or higher  Black, tarry-looking stools  For urgent or emergent issues, a gastroenterologist can be reached at any hour by calling [phone].   DIET:  We do recommend a small meal at first, but then you may proceed to your regular diet.  Drink  plenty of fluids but you should avoid alcoholic beverages for 24 hours.  ACTIVITY:  You should plan to take it easy for the rest of today and you should NOT DRIVE or use heavy machinery until tomorrow (because of the sedation medicines used during the test).    FOLLOW UP: Our staff will call the number listed on your records the next business day following your procedure to check on you and address any questions or concerns that you may have regarding the information given to you following your procedure. If we do not reach you, we will leave a message.  However, if you are feeling well and you are not experiencing any problems, there is no need to return our call.  We will assume that you have returned to your regular daily activities without incident.  If any biopsies were taken you will be contacted by phone or by letter within the next 1-3 weeks.  Please call us at [phone] if you have not heard about the biopsies in 3 weeks.    SIGNATURES/CONFIDENTIALITY: You and/or your care partner have signed paperwork which will be entered into your electronic medical record.  These signatures attest to the fact that that the information above on your After Visit Summary has been reviewed and is understood.  Full responsibility of the confidentiality of this discharge information lies with you and/or your care-partner.  Normal endoscopy,  Repeat 10 years-2028  Resume Plavix today ay  prior dose

## 2017-03-09 NOTE — Progress Notes (Signed)
Report given to PACU, vss 

## 2017-03-09 NOTE — Op Note (Signed)
Paul Hanson Patient Name: Paul Hanson Procedure Date: 03/09/2017 2:05 PM MRN: [id number] Endoscopist: Mallie Mussel L. Loletha Carrow , MD Age: 51 Referring MD:  Date of Birth: Apr 01, 1966 Gender: Male Account #: [id number] Procedure:                Colonoscopy Indications:              Screening for colorectal malignant neoplasm, This                            is the patient's first colonoscopy Medicines:                Monitored Anesthesia Care Procedure:                Pre-Anesthesia Assessment:                           - Prior to the procedure, a History and Physical                            was performed, and patient medications and                            allergies were reviewed. The patient's tolerance of                            previous anesthesia was also reviewed. The risks                            and benefits of the procedure and the sedation                            options and risks were discussed with the patient.                            All questions were answered, and informed consent                            was obtained. Prior Anticoagulants: The patient has                            taken Plavix (clopidogrel), last dose was 5 days                            prior to procedure. ASA Grade Assessment: III - A                            patient with severe systemic disease. After                            reviewing the risks and benefits, the patient was                            deemed in satisfactory condition to undergo the  procedure.                           After obtaining informed consent, the colonoscope                            was passed under direct vision. Throughout the                            procedure, the patient's blood pressure, pulse, and                            oxygen saturations were monitored continuously. The                            Model CF-HQ190L [id number]) scope was introduced                             through the anus and advanced to the the cecum,                            identified by appendiceal orifice and ileocecal                            valve. The colonoscopy was performed without                            difficulty. The patient tolerated the procedure                            well. The quality of the bowel preparation was                            excellent. The ileocecal valve, appendiceal                            orifice, and rectum were photographed. The quality                            of the bowel preparation was evaluated using the                            BBPS [facility] Bowel Preparation Scale) with scores                            of: Right Colon = 3, Transverse Colon = 3 and Left                            Colon = 3 (entire mucosa seen well with no residual                            staining, small fragments of stool or opaque  liquid). The total BBPS score equals 9. The bowel                            preparation used was SUPREP. Scope In: 2:22:40 PM Scope Out: 2:32:41 PM Scope Withdrawal Time: 0 hours 7 minutes 4 seconds  Total Procedure Duration: 0 hours 10 minutes 1 second  Findings:                 The perianal and digital rectal examinations were                            normal.                           The entire examined colon appeared normal on direct                            and retroflexion views. Complications:            No immediate complications. Estimated Blood Loss:     Estimated blood loss: none. Impression:               - The entire examined colon is normal on direct and                            retroflexion views.                           - No specimens collected. Recommendation:           - Patient has a contact number available for                            emergencies. The signs and symptoms of potential                            delayed complications were discussed with the                             patient. Return to normal activities tomorrow.                            Written discharge instructions were provided to the                            patient.                           - Resume previous diet.                           - Continue present medications.                           - Resume Plavix (clopidogrel) at prior dose today.                           - Repeat colonoscopy in  10 years for screening                            purposes. Abbagayle Zaragoza L. Loletha Carrow, MD 03/09/2017 2:35:43 PM This report has been signed electronically.

## 2017-03-09 NOTE — Progress Notes (Signed)
Pt's states no medical or surgical changes since previsit or office visit. 

## 2017-03-10 ENCOUNTER — Telehealth: Payer: Self-pay

## 2017-03-10 NOTE — Telephone Encounter (Signed)
  Follow up Call-  Call back number 03/09/2017  Post procedure Call Back phone  # [phone]  Permission to leave phone message Yes     Patient questions:  Do you have a fever, pain , or abdominal swelling? No. Pain Score  0 *  Have you tolerated food without any problems? Yes.    Have you been able to return to your normal activities? Yes.    Do you have any questions about your discharge instructions: Diet   No. Medications  No. Follow up visit  No.  Do you have questions or concerns about your Care? No.  Actions: * If pain score is 4 or above: No action needed, pain <4.

## 2017-03-16 MED FILL — CLOPIDOGREL 75 MG TABLET: 75 | 30 days supply | Qty: 30 | Fill #2

## 2017-03-19 ENCOUNTER — Other Ambulatory Visit: Payer: Self-pay | Admitting: Internal Medicine

## 2017-03-19 MED FILL — JANUVIA 100 MG TABLET: 100 | 30 days supply | Qty: 30 | Fill #0

## 2017-03-20 NOTE — Telephone Encounter (Signed)
Patient needs follow up appointment with me in August. He never scheduled after his May appointment. Also, please have patient come in for lab draw only at his convenience; standing lab orders are placed. Thanks!

## 2017-03-30 ENCOUNTER — Other Ambulatory Visit: Payer: Self-pay | Admitting: Internal Medicine

## 2017-04-13 MED FILL — CLOPIDOGREL 75 MG TABLET: 75 | 30 days supply | Qty: 30 | Fill #3

## 2017-04-14 ENCOUNTER — Telehealth: Payer: Self-pay | Admitting: Pharmacist

## 2017-04-14 NOTE — Progress Notes (Signed)
Called patient for medication follow up. Spoke to family who states patient is doing well overall, no assistance needed at this time. BP and A1C are at goal. PCP appointment scheduled for 05/13/17. Advised patient/family to contact clinic if any concerns arise.

## 2017-04-23 ENCOUNTER — Other Ambulatory Visit: Payer: Self-pay | Admitting: Pharmacist

## 2017-04-23 DIAGNOSIS — I10 Essential (primary) hypertension: Secondary | ICD-10-CM

## 2017-04-23 DIAGNOSIS — I6359 Cerebral infarction due to unspecified occlusion or stenosis of other cerebral artery: Secondary | ICD-10-CM

## 2017-04-23 DIAGNOSIS — E119 Type 2 diabetes mellitus without complications: Secondary | ICD-10-CM

## 2017-04-23 MED ORDER — SITAGLIPTIN PHOS-METFORMIN HCL 50-1000 MG PO TABS: 1.0000 | ORAL_TABLET | Freq: Two times a day (BID) | ORAL | 0 refills | Status: DC

## 2017-04-23 MED ORDER — LISINOPRIL-HYDROCHLOROTHIAZIDE 20-12.5 MG PO TABS: 2.0000 | ORAL_TABLET | Freq: Every day | ORAL | 0 refills | Status: DC

## 2017-04-23 MED ORDER — CLOPIDOGREL BISULFATE 75 MG PO TABS: 75.0000 mg | ORAL_TABLET | Freq: Every day | ORAL | 0 refills | Status: DC

## 2017-04-23 MED ORDER — ATORVASTATIN CALCIUM 80 MG PO TABS: 80.0000 mg | ORAL_TABLET | Freq: Every day | ORAL | 0 refills | Status: DC

## 2017-04-23 MED FILL — JANUMET 50-1,000 MG TABLET: 50-1000 | 30 days supply | Qty: 60 | Fill #0

## 2017-04-23 MED FILL — LISINOPRIL-HCTZ 20-12.5 MG: 20-12.5 | 30 days supply | Qty: 60 | Fill #0

## 2017-04-23 MED FILL — ATORVASTATIN 80 MG TABLET: 80 | 30 days supply | Qty: 30 | Fill #0

## 2017-05-13 ENCOUNTER — Encounter: Payer: Self-pay | Admitting: Internal Medicine

## 2017-05-13 ENCOUNTER — Ambulatory Visit (INDEPENDENT_AMBULATORY_CARE_PROVIDER_SITE_OTHER): Payer: Self-pay | Admitting: Internal Medicine

## 2017-05-13 VITALS — BP 118/75 | HR 75 | Temp 97.4°F | Ht 68.0 in | Wt 212.7 lb

## 2017-05-13 DIAGNOSIS — N529 Male erectile dysfunction, unspecified: Secondary | ICD-10-CM

## 2017-05-13 DIAGNOSIS — I10 Essential (primary) hypertension: Secondary | ICD-10-CM

## 2017-05-13 DIAGNOSIS — E119 Type 2 diabetes mellitus without complications: Secondary | ICD-10-CM

## 2017-05-13 DIAGNOSIS — Z0000 Encounter for general adult medical examination without abnormal findings: Secondary | ICD-10-CM

## 2017-05-13 DIAGNOSIS — Z794 Long term (current) use of insulin: Secondary | ICD-10-CM

## 2017-05-13 DIAGNOSIS — Z8249 Family history of ischemic heart disease and other diseases of the circulatory system: Secondary | ICD-10-CM

## 2017-05-13 DIAGNOSIS — R4189 Other symptoms and signs involving cognitive functions and awareness: Secondary | ICD-10-CM

## 2017-05-13 DIAGNOSIS — Z79899 Other long term (current) drug therapy: Secondary | ICD-10-CM

## 2017-05-13 LAB — POCT GLYCOSYLATED HEMOGLOBIN (HGB A1C): Hemoglobin A1C: 7.3

## 2017-05-13 LAB — GLUCOSE, CAPILLARY: Glucose-Capillary: 143 mg/dL — ABNORMAL HIGH (ref 65–99)

## 2017-05-13 NOTE — Progress Notes (Signed)
   CC: DM follow up  HPI:  [name] is a 51 y.o. male with a past medical history listed below here today for follow up of his DM.   For details of today's visit and the status of his chronic medical issues please refer to the assessment and plan.  Past Medical History:  Diagnosis Date  . Brain tumor ([place])   . Diabetes mellitus without complication ([place]) 3953  . Hypertension   . Stroke [facility])    Review of Systems:   No chest pain or shortness of breath  Physical Exam:  Vitals:   05/13/17 1456  BP: 118/75  Pulse: 75  Temp: (!) 97.4 F (36.3 C)  TempSrc: Oral  SpO2: 96%  Weight: 212 lb 11.2 oz (96.5 kg)  Height: 5\' 8"  (1.727 m)   GENERAL- alert, co-operative, appears as stated age, not in any distress. CARDIAC- RRR, no murmurs, rubs or gallops. RESP- Moving equal volumes of air, and clear to auscultation bilaterally, no wheezes or crackles. ABDOMEN- Soft, nontender, bowel sounds present. EXTREMITIES- pulse 2+, symmetric, no pedal edema. SKIN- Warm, dry, No rash or lesion. PSYCH- Normal mood and affect, appropriate thought content and speech.   Assessment & Plan:   See Encounters Tab for problem based charting.  Patient discussed with Dr. Lynnae January

## 2017-05-13 NOTE — Patient Instructions (Signed)
Paul Hanson,   Paul Hanson are doing well. Your A1c was a little bit elevated today at 7.3. We would like to get you less than 7. Work on cutting out the sugars from [employer] and drinking more water.  Please come back for a morning lab draw (between 8-10 am) when it is convenient for you.   I would like to see you back in 3 months.

## 2017-05-14 NOTE — Assessment & Plan Note (Signed)
Declined flu shot today.

## 2017-05-14 NOTE — Assessment & Plan Note (Addendum)
Lab Results  Component Value Date   HGBA1C 7.3 05/13/2017   HGBA1C 6.9 12/31/2016   HGBA1C 7.3 09/17/2016    A1c 6.9 > 7.3. He is currently on sitagliptin-metfomin 50-1000 mg bid. He does report that he has been less strict with his dietary habits now that he is not checking his blood sugars very often at home. Reports eating more sweets recently. No other dietary factors he can identify today. Denies any episodes of hypoglycemia.   A/P: Continue current medications Encourage dietary change & exercise RTC 3 months for re-check  Due for DM eye exam in January, foot exam at next visit

## 2017-05-15 NOTE — Assessment & Plan Note (Signed)
Reports that he has been able to have erections but reports decreased erection strength. Does report he is able to maintain an erection. Denies any nocturnal tumescence. No decreased desire or libido. Denies any other symptoms today. Last Testosterone level was slightly low at 221.  A/P Repeat AM testosterone and check FSH, LH, prolactin

## 2017-05-15 NOTE — Progress Notes (Signed)
Internal Medicine Clinic Attending  Case discussed with Dr. Boswell at the time of the visit.  We reviewed the resident's history and exam and pertinent patient test results.  I agree with the assessment, diagnosis, and plan of care documented in the resident's note.  

## 2017-05-15 NOTE — Assessment & Plan Note (Signed)
Denies any further episodes since April 2018

## 2017-05-15 NOTE — Assessment & Plan Note (Signed)
BP Readings from Last 3 Encounters:  05/13/17 118/75  03/09/17 116/71  02/12/17 140/84    Lab Results  Component Value Date   NA 141 02/04/2017   K 4.6 02/04/2017   CREATININE 0.81 02/04/2017   BP today 118/75. Currently on Lisinopril-HCTZ 40-25 mg daily. Last BMET 01/2017 unremarkable. Denies any headaches, dizziness/lightheadedness, chest pain, shortness of breath.   Assessment: HTN, well controlled  Plan: Continue current medications

## 2017-05-19 ENCOUNTER — Other Ambulatory Visit: Payer: Self-pay | Admitting: Internal Medicine

## 2017-05-19 DIAGNOSIS — E119 Type 2 diabetes mellitus without complications: Secondary | ICD-10-CM

## 2017-05-19 DIAGNOSIS — I10 Essential (primary) hypertension: Secondary | ICD-10-CM

## 2017-05-19 MED FILL — CLOPIDOGREL 75 MG TABLET: 75 | 30 days supply | Qty: 30 | Fill #4

## 2017-05-26 ENCOUNTER — Ambulatory Visit: Payer: Self-pay

## 2017-05-27 ENCOUNTER — Other Ambulatory Visit: Payer: Self-pay | Admitting: Pharmacist

## 2017-05-27 DIAGNOSIS — I6359 Cerebral infarction due to unspecified occlusion or stenosis of other cerebral artery: Secondary | ICD-10-CM

## 2017-05-27 DIAGNOSIS — I10 Essential (primary) hypertension: Secondary | ICD-10-CM

## 2017-05-27 DIAGNOSIS — E119 Type 2 diabetes mellitus without complications: Secondary | ICD-10-CM

## 2017-05-27 MED ORDER — LISINOPRIL-HYDROCHLOROTHIAZIDE 20-12.5 MG PO TABS: 2.0000 | ORAL_TABLET | Freq: Every day | ORAL | 0 refills | Status: DC

## 2017-05-27 MED ORDER — SITAGLIP PHOS-METFORMIN HCL ER 50-1000 MG PO TB24: 1.0000 | ORAL_TABLET | Freq: Two times a day (BID) | ORAL | 0 refills | Status: DC

## 2017-05-27 MED ORDER — ATORVASTATIN CALCIUM 80 MG PO TABS: 80.0000 mg | ORAL_TABLET | Freq: Every day | ORAL | 0 refills | Status: DC

## 2017-05-28 ENCOUNTER — Ambulatory Visit: Payer: Self-pay

## 2017-05-28 MED ORDER — LISINOPRIL-HYDROCHLOROTHIAZIDE 20-12.5 MG PO TABS: 2.0000 | ORAL_TABLET | Freq: Every day | ORAL | 3 refills | Status: DC

## 2017-05-28 MED ORDER — SITAGLIP PHOS-METFORMIN HCL ER 50-1000 MG PO TB24: 1.0000 | ORAL_TABLET | Freq: Two times a day (BID) | ORAL | 3 refills | Status: DC

## 2017-05-28 MED ORDER — ATORVASTATIN CALCIUM 80 MG PO TABS: 80.0000 mg | ORAL_TABLET | Freq: Every day | ORAL | 3 refills | Status: DC

## 2017-05-28 MED FILL — ATORVASTATIN 80 MG TABLET: 80 | 30 days supply | Qty: 30 | Fill #0

## 2017-05-28 MED FILL — JANUMET XR 50-1,000 MG TAB: 50-1000 | 30 days supply | Qty: 60 | Fill #0

## 2017-05-28 MED FILL — LISINOPRIL-HCTZ 20-12.5 MG: 20-12.5 | 30 days supply | Qty: 60 | Fill #0

## 2017-06-01 MED ORDER — CLOPIDOGREL BISULFATE 75 MG PO TABS: 75.0000 mg | ORAL_TABLET | Freq: Every day | ORAL | 3 refills | Status: DC

## 2017-06-01 NOTE — Addendum Note (Signed)
Addended by: Forde Dandy on: 06/01/2017 06:18 PM   Modules accepted: Orders

## 2017-06-12 NOTE — Addendum Note (Signed)
Addended by: [place] Lions on: 06/12/2017 07:36 AM   Modules accepted: Orders

## 2017-06-26 ENCOUNTER — Other Ambulatory Visit: Payer: Self-pay | Admitting: *Deleted

## 2017-06-26 DIAGNOSIS — I10 Essential (primary) hypertension: Secondary | ICD-10-CM

## 2017-06-26 DIAGNOSIS — E119 Type 2 diabetes mellitus without complications: Secondary | ICD-10-CM

## 2017-06-26 DIAGNOSIS — I6359 Cerebral infarction due to unspecified occlusion or stenosis of other cerebral artery: Secondary | ICD-10-CM

## 2017-06-26 MED ORDER — LISINOPRIL-HYDROCHLOROTHIAZIDE 20-12.5 MG PO TABS: 2.0000 | ORAL_TABLET | Freq: Every day | ORAL | 0 refills | Status: DC

## 2017-06-26 MED ORDER — SITAGLIP PHOS-METFORMIN HCL ER 50-1000 MG PO TB24: 1.0000 | ORAL_TABLET | Freq: Two times a day (BID) | ORAL | 0 refills | Status: DC

## 2017-06-26 MED ORDER — ATORVASTATIN CALCIUM 80 MG PO TABS: 80.0000 mg | ORAL_TABLET | Freq: Every day | ORAL | 0 refills | Status: DC

## 2017-06-26 MED FILL — CLOPIDOGREL 75 MG TABLET: 75 | 30 days supply | Qty: 30 | Fill #5

## 2017-06-26 MED FILL — LISINOPRIL-HCTZ 20-12.5 MG: 20-12.5 | 30 days supply | Qty: 60 | Fill #0

## 2017-06-26 MED FILL — ATORVASTATIN 80 MG TABLET: 80 | 30 days supply | Qty: 30 | Fill #0

## 2017-06-26 MED FILL — JANUMET XR 50-1,000 MG TAB: 50-1000 | 30 days supply | Qty: 60 | Fill #0

## 2017-07-24 MED FILL — CLOPIDOGREL 75 MG TABLET: 75 | 30 days supply | Qty: 30 | Fill #6

## 2017-08-19 ENCOUNTER — Encounter: Payer: Self-pay | Admitting: Internal Medicine

## 2017-08-19 ENCOUNTER — Ambulatory Visit (INDEPENDENT_AMBULATORY_CARE_PROVIDER_SITE_OTHER): Payer: Self-pay | Admitting: Internal Medicine

## 2017-08-19 ENCOUNTER — Other Ambulatory Visit: Payer: Self-pay

## 2017-08-19 VITALS — BP 122/76 | HR 79 | Temp 97.5°F | Ht 68.0 in | Wt 215.1 lb

## 2017-08-19 DIAGNOSIS — I6359 Cerebral infarction due to unspecified occlusion or stenosis of other cerebral artery: Secondary | ICD-10-CM

## 2017-08-19 DIAGNOSIS — Z8673 Personal history of transient ischemic attack (TIA), and cerebral infarction without residual deficits: Secondary | ICD-10-CM

## 2017-08-19 DIAGNOSIS — Z923 Personal history of irradiation: Secondary | ICD-10-CM

## 2017-08-19 DIAGNOSIS — I10 Essential (primary) hypertension: Secondary | ICD-10-CM

## 2017-08-19 DIAGNOSIS — E119 Type 2 diabetes mellitus without complications: Secondary | ICD-10-CM

## 2017-08-19 DIAGNOSIS — Z7984 Long term (current) use of oral hypoglycemic drugs: Secondary | ICD-10-CM

## 2017-08-19 DIAGNOSIS — Z79899 Other long term (current) drug therapy: Secondary | ICD-10-CM

## 2017-08-19 DIAGNOSIS — Z85841 Personal history of malignant neoplasm of brain: Secondary | ICD-10-CM

## 2017-08-19 DIAGNOSIS — N529 Male erectile dysfunction, unspecified: Secondary | ICD-10-CM

## 2017-08-19 DIAGNOSIS — Z7902 Long term (current) use of antithrombotics/antiplatelets: Secondary | ICD-10-CM

## 2017-08-19 LAB — GLUCOSE, CAPILLARY: Glucose-Capillary: 146 mg/dL — ABNORMAL HIGH (ref 65–99)

## 2017-08-19 LAB — POCT GLYCOSYLATED HEMOGLOBIN (HGB A1C): Hemoglobin A1C: 7.3

## 2017-08-19 MED ORDER — CLOPIDOGREL BISULFATE 75 MG PO TABS: 75.0000 mg | ORAL_TABLET | Freq: Every day | ORAL | 3 refills | Status: DC

## 2017-08-19 NOTE — Assessment & Plan Note (Addendum)
BP Readings from Last 3 Encounters:  08/19/17 122/76  05/13/17 118/75  03/09/17 116/71    Lab Results  Component Value Date   NA 141 02/04/2017   K 4.6 02/04/2017   CREATININE 0.81 02/04/2017   BP slightly worse today than previously at 137/76. Improved to 122/71 on re-check. Goal < 130/90. Currently prescribed lisinopril-hctz 40-25 mg daily. Reports compliance. Denies any complaints today.   A/P: Continue current medications

## 2017-08-19 NOTE — Progress Notes (Signed)
   CC: DM follow up  HPI:  [name] is a 51 y.o. male with a past medical history listed below here today for follow up of his DM.   For details of today's visit and the status of his chronic medical issues please refer to the assessment and plan.   Past Medical History:  Diagnosis Date  . Brain tumor ([place])   . Diabetes mellitus without complication ([place]) 7026  . Hypertension   . Stroke [facility])    Review of Systems:   No chest pain or shortness of breath  Physical Exam:  Vitals:   08/19/17 1330  BP: 137/71  Pulse: 81  Temp: (!) 97.5 F (36.4 C)  TempSrc: Oral  SpO2: 100%  Weight: 215 lb 1.6 oz (97.6 kg)  Height: 5\' 8"  (1.727 m)   Physical Exam  Constitutional: He is oriented to person, place, and time and well-developed, well-nourished, and in no distress.  HENT:  Head: Normocephalic and atraumatic.  Eyes: Conjunctivae are normal. Pupils are equal, round, and reactive to light.  Cardiovascular: Normal rate, regular rhythm and normal heart sounds.  Pulmonary/Chest: Effort normal and breath sounds normal.  Abdominal: Bowel sounds are normal.  Neurological: He is alert and oriented to person, place, and time.  Skin: Skin is warm and dry.  Psychiatric: Mood and affect normal.     Assessment & Plan:   See Encounters Tab for problem based charting.  Patient discussed with Dr. Eppie Gibson

## 2017-08-19 NOTE — Assessment & Plan Note (Addendum)
Lab Results  Component Value Date   HGBA1C 7.3 08/19/2017   HGBA1C 7.3 05/13/2017   HGBA1C 6.9 12/31/2016   A1c stable at 7.3 today. Currently on sitagliptin-metfromin 50-1000 mg bid. Reports compliance. Denies any episodes of hypoglycemia.   A/P Discussed dietary and lifestyle changes. Continue current medications. DM foot exam done today due, pt declined today as he was in a hurry. Due for DM eye exam next month, will refer to ophthalmology (previously done at [facility])

## 2017-08-19 NOTE — Patient Instructions (Addendum)
Mr. Allred,  I am glad you are doing well. Try to continue to work getting back to routine exercise. Please come back in the morning (any morning that works for you) and have your labs checked.  Follow up with me in February.

## 2017-08-21 ENCOUNTER — Telehealth: Payer: Self-pay | Admitting: *Deleted

## 2017-08-21 NOTE — Assessment & Plan Note (Signed)
Patient reports continued issues today. He provided more information at today's visit. He says that he has had difficulties maintaining an erection since he had radiation for his brain tumor in the 90s. Since that time he has not had difficulties obtaining erections, does report nocturnal tumescence today, denies any decreased libido or desire. Notes he is unable to maintain an erection longer than 5-10 minutes.   Declined genital exam today. All visual fields intact on exam.   A/P Appears this is more of a chronic issue than he first reported. Given his previous mildly low Testosterone, will go ahead and re-check previous labs discussed at last visit.

## 2017-08-21 NOTE — Assessment & Plan Note (Signed)
Refilled plavix today

## 2017-08-21 NOTE — Telephone Encounter (Signed)
CALLED PATIENT, SPOKE WITH SISTER. PATIENT IS NOT ABLE TO PAY OUT OF POCKET FOR EYE EXAM. PATIENT HAS THE ORANGE CARD / GOOD TILL 3-21-019. WILL HOLD REFERRAL TILL January 019.

## 2017-08-24 NOTE — Progress Notes (Signed)
Case discussed with Dr. Boswell at the time of the visit. We reviewed the resident's history and exam and pertinent patient test results. I agree with the assessment, diagnosis, and plan of care documented in the resident's note. 

## 2017-08-27 ENCOUNTER — Other Ambulatory Visit: Payer: Self-pay | Admitting: Pharmacist

## 2017-08-27 DIAGNOSIS — Z8673 Personal history of transient ischemic attack (TIA), and cerebral infarction without residual deficits: Secondary | ICD-10-CM

## 2017-08-27 DIAGNOSIS — E119 Type 2 diabetes mellitus without complications: Secondary | ICD-10-CM

## 2017-08-27 DIAGNOSIS — I6359 Cerebral infarction due to unspecified occlusion or stenosis of other cerebral artery: Secondary | ICD-10-CM

## 2017-08-27 DIAGNOSIS — I10 Essential (primary) hypertension: Secondary | ICD-10-CM

## 2017-08-27 MED ORDER — CLOPIDOGREL BISULFATE 75 MG PO TABS: 75.0000 mg | ORAL_TABLET | Freq: Every day | ORAL | 0 refills | Status: DC

## 2017-08-27 MED ORDER — SITAGLIP PHOS-METFORMIN HCL ER 50-1000 MG PO TB24: 1.0000 | ORAL_TABLET | Freq: Two times a day (BID) | ORAL | 3 refills | Status: DC

## 2017-08-27 MED ORDER — LISINOPRIL-HYDROCHLOROTHIAZIDE 20-12.5 MG PO TABS: 2.0000 | ORAL_TABLET | Freq: Every day | ORAL | 3 refills | Status: DC

## 2017-08-27 MED ORDER — ATORVASTATIN CALCIUM 80 MG PO TABS: 80.0000 mg | ORAL_TABLET | Freq: Every day | ORAL | 3 refills | Status: DC

## 2017-08-27 MED FILL — CLOPIDOGREL 75 MG TABLET: 75 | 30 days supply | Qty: 30 | Fill #7

## 2017-08-27 NOTE — Progress Notes (Signed)
Patient requested 1-time fill on clopidogrel (Plavix) at [facility] while awaiting shipment from [place]. Patient also states he will run out of refills on other medications before his next follow up appointment. Refills sent for patient on these, per Dr. Charlynn Grimes last progress note, continue same medications. PCP follow up due February 2019, [place] [facility] enrollment expires Feb 2019.

## 2017-09-24 MED FILL — CLOPIDOGREL 75 MG TABLET: 75 | 30 days supply | Qty: 30 | Fill #8

## 2017-10-01 ENCOUNTER — Other Ambulatory Visit: Payer: Self-pay | Admitting: Internal Medicine

## 2017-10-01 DIAGNOSIS — I6359 Cerebral infarction due to unspecified occlusion or stenosis of other cerebral artery: Secondary | ICD-10-CM

## 2017-10-01 NOTE — Telephone Encounter (Signed)
Pt's stated all meds are refilled at [facility].

## 2018-01-13 ENCOUNTER — Encounter: Payer: Self-pay | Admitting: Internal Medicine

## 2018-02-10 MED FILL — LISINOPRIL-HCTZ 20-12.5 MG: 20-12.5 | 30 days supply | Qty: 60 | Fill #1

## 2018-02-22 ENCOUNTER — Ambulatory Visit (INDEPENDENT_AMBULATORY_CARE_PROVIDER_SITE_OTHER): Payer: Medicare Other | Admitting: Internal Medicine

## 2018-02-22 ENCOUNTER — Encounter: Payer: Self-pay | Admitting: Internal Medicine

## 2018-02-22 ENCOUNTER — Other Ambulatory Visit: Payer: Self-pay

## 2018-02-22 ENCOUNTER — Telehealth: Payer: Self-pay | Admitting: *Deleted

## 2018-02-22 VITALS — BP 122/75 | HR 100 | Temp 98.5°F | Wt 216.5 lb

## 2018-02-22 DIAGNOSIS — E119 Type 2 diabetes mellitus without complications: Secondary | ICD-10-CM | POA: Diagnosis not present

## 2018-02-22 DIAGNOSIS — H9190 Unspecified hearing loss, unspecified ear: Secondary | ICD-10-CM | POA: Diagnosis not present

## 2018-02-22 DIAGNOSIS — Z79899 Other long term (current) drug therapy: Secondary | ICD-10-CM

## 2018-02-22 DIAGNOSIS — I5030 Unspecified diastolic (congestive) heart failure: Secondary | ICD-10-CM | POA: Diagnosis not present

## 2018-02-22 DIAGNOSIS — Z7984 Long term (current) use of oral hypoglycemic drugs: Secondary | ICD-10-CM | POA: Diagnosis not present

## 2018-02-22 DIAGNOSIS — I10 Essential (primary) hypertension: Secondary | ICD-10-CM

## 2018-02-22 DIAGNOSIS — H9193 Unspecified hearing loss, bilateral: Secondary | ICD-10-CM

## 2018-02-22 DIAGNOSIS — Z8673 Personal history of transient ischemic attack (TIA), and cerebral infarction without residual deficits: Secondary | ICD-10-CM | POA: Diagnosis not present

## 2018-02-22 LAB — GLUCOSE, CAPILLARY: Glucose-Capillary: 205 mg/dL — ABNORMAL HIGH (ref 65–99)

## 2018-02-22 LAB — POCT GLYCOSYLATED HEMOGLOBIN (HGB A1C): HEMOGLOBIN A1C: 8.1 % — AB (ref 4.0–5.6)

## 2018-02-22 MED ORDER — CANAGLIFLOZIN 100 MG PO TABS: 100.0000 mg | ORAL_TABLET | Freq: Every day | ORAL | 1 refills | Status: DC

## 2018-02-22 MED ORDER — EMPAGLIFLOZIN 10 MG PO TABS: 10.0000 mg | ORAL_TABLET | Freq: Every day | ORAL | 2 refills | Status: DC

## 2018-02-22 MED FILL — INVOKANA 100 MG TABLET: 100 | 30 days supply | Qty: 30 | Fill #0

## 2018-02-22 NOTE — Patient Instructions (Signed)
Mr. Stocks,  Work on your diet and eating out less. I am getting you started on a new medication called Jardiance for your diabetes.   Empagliflozin oral tablets What is this medicine? EMPAGLIFLOZIN (EM pa gli FLOE zin) helps to treat type 2 diabetes. It helps to control blood sugar. This drug may also reduce the risk of heart attack or stroke if you have type 2 diabetes and risk factors for heart disease. Treatment is combined with diet and exercise. This medicine may be used for other purposes; ask your health care provider or pharmacist if you have questions. COMMON BRAND NAME(S): JARDIANCE What should I tell my health care provider before I take this medicine? They need to know if you have any of these conditions: -dehydration -diabetic ketoacidosis -diet low in salt -eating less due to illness, surgery, dieting, or any other reason -having surgery -high cholesterol -high levels of potassium in the blood -history of pancreatitis or pancreas problems -history of yeast infection of the penis or vagina -if you often drink alcohol -infections in the bladder, kidneys, or urinary tract -kidney disease -liver disease -low blood pressure -on hemodialysis -problems urinating -type 1 diabetes -uncircumcised male -an unusual or allergic reaction to empagliflozin, other medicines, foods, dyes, or preservatives -pregnant or trying to get pregnant -breast-feeding How should I use this medicine? Take this medicine by mouth with a glass of water. Follow the directions on the prescription label. Take it in the morning, with or without food. Take your dose at the same time each day. Do not take more often than directed. Do not stop taking except on your doctor's advice. Talk to your pediatrician regarding the use of this medicine in children. Special care may be needed. Overdosage: If you think you have taken too much of this medicine contact a poison control center or emergency room at  once. NOTE: This medicine is only for you. Do not share this medicine with others. What if I miss a dose? If you miss a dose, take it as soon as you can. If it is almost time for your next dose, take only that dose. Do not take double or extra doses. What may interact with this medicine? Do not take this medicine with any of the following medications: -gatifloxacin This medicine may also interact with the following medications: -alcohol -certain medicines for blood pressure, heart disease -diuretics This list may not describe all possible interactions. Give your health care provider a list of all the medicines, herbs, non-prescription drugs, or dietary supplements you use. Also tell them if you smoke, drink alcohol, or use illegal drugs. Some items may interact with your medicine. What should I watch for while using this medicine? Visit your doctor or health care professional for regular checks on your progress. This medicine can cause a serious condition in which there is too much acid in the blood. If you develop nausea, vomiting, stomach pain, unusual tiredness, or breathing problems, stop taking this medicine and call your doctor right away. If possible, use a ketone dipstick to check for ketones in your urine. A test called the HbA1C (A1C) will be monitored. This is a simple blood test. It measures your blood sugar control over the last 2 to 3 months. You will receive this test every 3 to 6 months. Learn how to check your blood sugar. Learn the symptoms of low and high blood sugar and how to manage them. Always carry a quick-source of sugar with you in case you have symptoms  of low blood sugar. Examples include hard sugar candy or glucose tablets. Make sure others know that you can choke if you eat or drink when you develop serious symptoms of low blood sugar, such as seizures or unconsciousness. They must get medical help at once. Tell your doctor or health care professional if you have high  blood sugar. You might need to change the dose of your medicine. If you are sick or exercising more than usual, you might need to change the dose of your medicine. Do not skip meals. Ask your doctor or health care professional if you should avoid alcohol. Many nonprescription cough and cold products contain sugar or alcohol. These can affect blood sugar. Wear a medical ID bracelet or chain, and carry a card that describes your disease and details of your medicine and dosage times. What side effects may I notice from receiving this medicine? Side effects that you should report to your doctor or health care professional as soon as possible: -allergic reactions like skin rash, itching or hives, swelling of the face, lips, or tongue -breathing problems -dizziness -fast or irregular heartbeat -feeling faint or lightheaded, falls -muscle weakness -nausea, vomiting, unusual stomach upset or pain -signs and symptoms of low blood sugar such as feeling anxious, confusion, dizziness, increased hunger, unusually weak or tired, sweating, shakiness, cold, irritable, headache, blurred vision, fast heartbeat, loss of consciousness -signs and symptoms of a urinary tract infection, such as fever, chills, a burning feeling when urinating, blood in the urine, back pain -trouble passing urine or change in the amount of urine, including an urgent need to urinate more often, in larger amounts, or at night -penile discharge, itching, or pain in men -unusual tiredness -vaginal discharge, itching, or odor in women Side effects that usually do not require medical attention (report to your doctor or health care professional if they continue or are bothersome): -joint pain -mild increase in urination -thirsty This list may not describe all possible side effects. Call your doctor for medical advice about side effects. You may report side effects to FDA at 1-800-FDA-1088. Where should I keep my medicine? Keep out of the  reach of children. Store at room temperature between 20 and 25 degrees C (68 and 77 degrees F). Throw away any unused medicine after the expiration date. NOTE: This sheet is a summary. It may not cover all possible information. If you have questions about this medicine, talk to your doctor, pharmacist, or health care provider.  2018 Elsevier/Gold Standard (2015-10-04 11:46:10)

## 2018-02-22 NOTE — Telephone Encounter (Signed)
Pt does not have PART D so jardiance would be $1300.00. janumet and junuvia would be $4.00 on IM PROGRAM. Please send new script if you decide to change and include IM PROGRAM in body of script thanks

## 2018-02-22 NOTE — Progress Notes (Signed)
   CC: DM follow up  HPI:  Mr.Americo Finigan is a 52 y.o. male with a past medical history listed below here today for follow up of his DM.   For details of today's visit and the status of his chronic medical issues please refer to the assessment and plan.   Past Medical History:  Diagnosis Date  . Brain tumor ([place])   . Diabetes mellitus without complication ([place]) 2119  . Hypertension   . Stroke [facility]    Review of Systems:   No chest pain or shortness of breath  Physical Exam:  Vitals:   02/22/18 1333  BP: 122/75  Pulse: 100  Temp: 98.5 F (36.9 C)  TempSrc: Oral  SpO2: 100%  Weight: 216 lb 8 oz (98.2 kg)   GENERAL- alert, co-operative, appears as stated age, not in any distress. HEENT- Atraumatic, normocephali CARDIAC- RRR, no murmurs, rubs or gallops. RESP- Moving equal volumes of air, and clear to auscultation bilaterally, no wheezes or crackles. NEURO- No obvious Cr N abnormality. EXTREMITIES- pulse 2+, symmetric, no pedal edema. SKIN- Warm, dry, No rash or lesion. PSYCH- Normal mood and affect, appropriate thought content and speech.   Assessment & Plan:   See Encounters Tab for problem based charting.  Patient discussed with Dr. Evette Doffing

## 2018-02-23 LAB — BMP8+ANION GAP
Anion Gap: 18 mmol/L (ref 10.0–18.0)
BUN/Creatinine Ratio: 11 (ref 9–20)
BUN: 12 mg/dL (ref 6–24)
CALCIUM: 9.8 mg/dL (ref 8.7–10.2)
CO2: 22 mmol/L (ref 20–29)
CREATININE: 1.08 mg/dL (ref 0.76–1.27)
Chloride: 99 mmol/L (ref 96–106)
GFR calc Af Amer: 91 mL/min/{1.73_m2} (ref >59)
GFR calc non Af Amer: 79 mL/min/{1.73_m2} (ref >59)
GLUCOSE: 223 mg/dL — AB (ref 65–99)
Potassium: 4 mmol/L (ref 3.5–5.2)
SODIUM: 139 mmol/L (ref 134–144)

## 2018-02-23 NOTE — Assessment & Plan Note (Addendum)
Lab Results  Component Value Date   HGBA1C 8.1 (A) 02/22/2018   HGBA1C 7.3 08/19/2017   HGBA1C 7.3 05/13/2017    A1c worsened today at 8.1.  He is currently taking stiagliptin-metformin [phone] mg twice daily.  He reports eating out more with some dietary indiscretions may have affected his A1c.  Denies any episodes of hypoglycemia.  Assessment and plan: Discussed options with patient.  She is already on a DPP 4 we will start him on a SGLT2 inhibitor which given his history of CVA and diastolic heart failure would be beneficial for the patient.  Outpatient pharmacy covers Invokana on the $4 list and we will start this today.

## 2018-02-23 NOTE — Assessment & Plan Note (Signed)
Patient requesting referral for formal hearing testing as he and his wife feel that his hearing is worsening.  Will refer for hearing testing.

## 2018-02-23 NOTE — Assessment & Plan Note (Signed)
BP Readings from Last 3 Encounters:  02/22/18 122/75  08/19/17 122/76  05/13/17 118/75    Lab Results  Component Value Date   NA 139 02/22/2018   K 4.0 02/22/2018   CREATININE 1.08 02/22/2018   Today well controlled at 22/75.  He is currently taking lisinopril-hydrochlorothiazide 40-25 milligrams daily.  He reports compliance with his medications.  He denies any side effects.  No complaints today including no chest pain, palpitations, shortness of breath.  A/P Continue current medications.  Recheck BMP today.  BMP unremarkable.

## 2018-02-23 NOTE — Progress Notes (Signed)
Internal Medicine Clinic Attending  Case discussed with Dr. Boswell at the time of the visit.  We reviewed the resident's history and exam and pertinent patient test results.  I agree with the assessment, diagnosis, and plan of care documented in the resident's note.  

## 2018-03-01 ENCOUNTER — Other Ambulatory Visit: Payer: Self-pay | Admitting: Internal Medicine

## 2018-03-01 ENCOUNTER — Other Ambulatory Visit: Payer: Self-pay | Admitting: Student in an Organized Health Care Education/Training Program

## 2018-03-01 DIAGNOSIS — E119 Type 2 diabetes mellitus without complications: Secondary | ICD-10-CM

## 2018-03-01 DIAGNOSIS — I6359 Cerebral infarction due to unspecified occlusion or stenosis of other cerebral artery: Secondary | ICD-10-CM

## 2018-03-01 MED ORDER — CLOPIDOGREL BISULFATE 75 MG PO TABS: ORAL_TABLET | ORAL | 3 refills | Status: AC

## 2018-03-01 MED ORDER — SITAGLIP PHOS-METFORMIN HCL ER 50-1000 MG PO TB24: ORAL_TABLET | ORAL | 2 refills | Status: DC

## 2018-03-01 MED FILL — CLOPIDOGREL 75 MG TABLET: 75 | 30 days supply | Qty: 30 | Fill #0

## 2018-03-01 MED FILL — ATORVASTATIN 80 MG TABLET: 80 | 30 days supply | Qty: 30 | Fill #0

## 2018-03-01 MED FILL — JANUMET XR 50-1,000 MG TAB: 50-1000 | 30 days supply | Qty: 60 | Fill #0

## 2018-03-01 NOTE — Telephone Encounter (Signed)
Can you resend Plavix rx with IM Program in the sig?Regenia Skeeter, Desiree Fleming Cassady6/17/201912:09 PM  Per pt's sister (Patricia)-pt will be using [facility] for further medication refills through [facility].

## 2018-03-01 NOTE — Addendum Note (Signed)
Addended by: Congers Lions on: 03/01/2018 01:03 PM   Modules accepted: Orders

## 2018-03-01 NOTE — Addendum Note (Signed)
Addended by: Carmel Lions on: 03/01/2018 01:04 PM   Modules accepted: Orders

## 2018-03-01 NOTE — Telephone Encounter (Signed)
Can you resend Janumet rx with IM Program in the sig.Despina Hidden Cassady6/17/201912:07 PM

## 2018-03-08 ENCOUNTER — Encounter: Payer: Self-pay | Admitting: *Deleted

## 2018-03-14 ENCOUNTER — Encounter: Payer: Self-pay | Admitting: *Deleted

## 2018-03-15 MED FILL — LISINOPRIL-HCTZ 20-12.5 MG: 20-12.5 | 30 days supply | Qty: 60 | Fill #2

## 2018-03-29 ENCOUNTER — Other Ambulatory Visit: Payer: Self-pay | Admitting: Student in an Organized Health Care Education/Training Program

## 2018-03-29 DIAGNOSIS — I6359 Cerebral infarction due to unspecified occlusion or stenosis of other cerebral artery: Secondary | ICD-10-CM

## 2018-03-29 DIAGNOSIS — E119 Type 2 diabetes mellitus without complications: Secondary | ICD-10-CM

## 2018-03-29 MED FILL — CLOPIDOGREL 75 MG TABLET: 75 | 30 days supply | Qty: 30 | Fill #1

## 2018-03-29 MED FILL — JANUMET XR 50-1,000 MG TAB: 50-1000 | 30 days supply | Qty: 60 | Fill #1

## 2018-03-30 ENCOUNTER — Telehealth: Payer: Self-pay | Admitting: Internal Medicine

## 2018-03-30 MED FILL — ATORVASTATIN 80 MG TABLET: 80 | 90 days supply | Qty: 90 | Fill #0

## 2018-03-30 NOTE — Telephone Encounter (Signed)
Outpatient pharmacy has a question regarding order for the patient. Pls contact pharmacy [phone]

## 2018-03-30 NOTE — Telephone Encounter (Signed)
I would say fill his medications to see what the copays are, and then patient can decide if it is affordable or not. I wouldn't make any changes without seeing what his coverage is first.

## 2018-03-30 NOTE — Telephone Encounter (Signed)
Pharm, [facility], calls and states when pt enrolled in medicare he signed up for a bad plan, his copays can be hundreds of dollars. Pharmacist ask for advisement. Sending to dr Maudie Mercury and attending also include dr Laural Golden

## 2018-03-31 ENCOUNTER — Other Ambulatory Visit: Payer: Self-pay

## 2018-03-31 MED ORDER — EMPAGLIFLOZIN 10 MG PO TABS: 10.0000 mg | ORAL_TABLET | Freq: Every day | ORAL | 5 refills | Status: AC

## 2018-03-31 MED FILL — JARDIANCE 10 MG TABLET: 10 | 30 days supply | Qty: 30 | Fill #0

## 2018-03-31 NOTE — Telephone Encounter (Signed)
Spoke with Elmo Putt at [facility]. States in June Jardiance was switched to [place] for [employer]. With insurance Anastasio Auerbach is now $240. Switching back to Jardiance or Janumet XR would be $45 each. Please advise. Hubbard Hartshorn, RN, BSN

## 2018-03-31 NOTE — Telephone Encounter (Signed)
This has been addressed in separate phone encounter from 03/30/2018. Hubbard Hartshorn, RN, BSN

## 2018-03-31 NOTE — Telephone Encounter (Signed)
Elmo Putt with Outpatient pharmacy needs to speak with a nurse about meds. Please call back.

## 2018-03-31 NOTE — Telephone Encounter (Signed)
His meds are actually having hi copays and his wife states they can not afford them, lowest would be appr 45.00 highest 200.00+

## 2018-03-31 NOTE — Telephone Encounter (Signed)
Ok. I have d/c invokana and prescribed jardiance 10mg  to be taken once daily.

## 2018-03-31 NOTE — Addendum Note (Signed)
Addended by: Lalla Brothers T on: 03/31/2018 11:55 AM   Modules accepted: Orders

## 2018-04-13 MED FILL — LISINOPRIL-HCTZ 20-12.5 MG: 20-12.5 | 30 days supply | Qty: 60 | Fill #0

## 2018-04-21 DIAGNOSIS — H2513 Age-related nuclear cataract, bilateral: Secondary | ICD-10-CM | POA: Diagnosis not present

## 2018-04-21 DIAGNOSIS — E119 Type 2 diabetes mellitus without complications: Secondary | ICD-10-CM | POA: Diagnosis not present

## 2018-04-21 LAB — HM DIABETES EYE EXAM

## 2018-04-27 ENCOUNTER — Encounter: Payer: Self-pay | Admitting: *Deleted

## 2018-05-03 MED FILL — JARDIANCE 10 MG TABLET: 10 | 30 days supply | Qty: 30 | Fill #1

## 2018-05-03 MED FILL — CLOPIDOGREL 75 MG TABLET: 75 | 30 days supply | Qty: 30 | Fill #2

## 2018-05-03 MED FILL — JANUMET XR 50-1,000 MG TAB: 50-1000 | 30 days supply | Qty: 60 | Fill #2

## 2018-05-19 ENCOUNTER — Other Ambulatory Visit: Payer: Self-pay | Admitting: Student in an Organized Health Care Education/Training Program

## 2018-05-19 DIAGNOSIS — E119 Type 2 diabetes mellitus without complications: Secondary | ICD-10-CM

## 2018-05-19 DIAGNOSIS — I10 Essential (primary) hypertension: Secondary | ICD-10-CM

## 2018-05-19 MED FILL — LISINOPRIL-HCTZ 20-12.5 MG: 20-12.5 | 30 days supply | Qty: 60 | Fill #0

## 2018-05-31 ENCOUNTER — Ambulatory Visit: Payer: Medicare Other | Admitting: Audiology

## 2018-06-03 ENCOUNTER — Telehealth: Payer: Self-pay

## 2018-06-03 MED FILL — JANUMET XR 50-1,000 MG TAB: 50-1000 | 30 days supply | Qty: 60 | Fill #3

## 2018-06-03 MED FILL — CLOPIDOGREL 75 MG TABLET: 75 | 30 days supply | Qty: 30 | Fill #3

## 2018-06-03 MED FILL — JARDIANCE 10 MG TABLET: 10 | 30 days supply | Qty: 30 | Fill #2

## 2018-06-03 NOTE — Telephone Encounter (Signed)
OPEN ERROR

## 2018-06-28 ENCOUNTER — Other Ambulatory Visit: Payer: Self-pay | Admitting: Internal Medicine

## 2018-06-28 DIAGNOSIS — I10 Essential (primary) hypertension: Secondary | ICD-10-CM

## 2018-06-28 DIAGNOSIS — E119 Type 2 diabetes mellitus without complications: Secondary | ICD-10-CM

## 2018-06-28 DIAGNOSIS — I6359 Cerebral infarction due to unspecified occlusion or stenosis of other cerebral artery: Secondary | ICD-10-CM

## 2018-07-01 DIAGNOSIS — I10 Essential (primary) hypertension: Secondary | ICD-10-CM | POA: Diagnosis not present

## 2018-07-01 DIAGNOSIS — R413 Other amnesia: Secondary | ICD-10-CM | POA: Diagnosis not present

## 2018-07-01 DIAGNOSIS — H9190 Unspecified hearing loss, unspecified ear: Secondary | ICD-10-CM | POA: Diagnosis not present

## 2018-07-01 DIAGNOSIS — E119 Type 2 diabetes mellitus without complications: Secondary | ICD-10-CM | POA: Diagnosis not present

## 2018-07-01 DIAGNOSIS — Z8673 Personal history of transient ischemic attack (TIA), and cerebral infarction without residual deficits: Secondary | ICD-10-CM | POA: Diagnosis not present

## 2018-07-01 DIAGNOSIS — G8194 Hemiplegia, unspecified affecting left nondominant side: Secondary | ICD-10-CM | POA: Diagnosis not present

## 2018-07-01 DIAGNOSIS — E7841 Elevated Lipoprotein(a): Secondary | ICD-10-CM | POA: Diagnosis not present

## 2018-07-02 DIAGNOSIS — E7841 Elevated Lipoprotein(a): Secondary | ICD-10-CM | POA: Diagnosis not present

## 2018-07-02 DIAGNOSIS — E119 Type 2 diabetes mellitus without complications: Secondary | ICD-10-CM | POA: Diagnosis not present

## 2018-07-08 MED FILL — CLOPIDOGREL 75 MG TABLET: 75 | 30 days supply | Qty: 30 | Fill #0

## 2018-07-08 MED FILL — LISINOPRIL-HCTZ 20-12.5 TAB: 20-12.5 | 30 days supply | Qty: 60 | Fill #0

## 2018-07-08 MED FILL — JANUMET XR 50-1,000 MG TAB: 50-1000 | 30 days supply | Qty: 60 | Fill #0

## 2018-07-08 MED FILL — ATORVASTATIN 80 MG TABLET: 80 | 90 days supply | Qty: 90 | Fill #0

## 2018-07-08 MED FILL — JARDIANCE 10 MG TABLET: 10 | 30 days supply | Qty: 30 | Fill #0

## 2018-07-27 ENCOUNTER — Ambulatory Visit: Payer: Medicare Other | Attending: Internal Medicine | Admitting: Audiology

## 2018-07-27 NOTE — Addendum Note (Signed)
Addended by: Hulan Fray on: 07/27/2018 04:20 PM   Modules accepted: Orders

## 2018-08-04 MED FILL — JARDIANCE 25 MG TABLET: 25 | 30 days supply | Qty: 30 | Fill #0

## 2018-08-04 MED FILL — CLOPIDOGREL 75 MG TABLET: 75 | 30 days supply | Qty: 30 | Fill #1

## 2018-08-04 MED FILL — JANUMET XR 50-1,000 MG TAB: 50-1000 | 30 days supply | Qty: 60 | Fill #1

## 2018-08-11 MED FILL — LISINOPRIL-HCTZ 20-12.5 MG: 20-12.5 | 90 days supply | Qty: 180 | Fill #0

## 2018-08-30 MED FILL — JANUMET XR 50-1,000 MG TAB: 50-1000 | 30 days supply | Qty: 60 | Fill #2

## 2018-08-30 MED FILL — JARDIANCE 25 MG TABLET: 25 | 30 days supply | Qty: 30 | Fill #1

## 2018-08-30 MED FILL — CLOPIDOGREL 75 MG TABLET: 75 | 30 days supply | Qty: 30 | Fill #2

## 2018-09-27 ENCOUNTER — Other Ambulatory Visit: Payer: Self-pay | Admitting: Internal Medicine

## 2018-09-27 DIAGNOSIS — I6359 Cerebral infarction due to unspecified occlusion or stenosis of other cerebral artery: Secondary | ICD-10-CM

## 2018-09-27 DIAGNOSIS — E119 Type 2 diabetes mellitus without complications: Secondary | ICD-10-CM

## 2018-09-27 MED FILL — ATORVASTATIN 80 MG TABLET: 80 | 90 days supply | Qty: 90 | Fill #0

## 2018-09-27 MED FILL — CLOPIDOGREL 75 MG TABLET: 75 | 30 days supply | Qty: 30 | Fill #3

## 2018-09-27 MED FILL — JARDIANCE 25 MG TABLET: 25 | 30 days supply | Qty: 30 | Fill #2

## 2018-09-27 MED FILL — JANUMET XR 50-1,000 MG TAB: 50-1000 | 30 days supply | Qty: 60 | Fill #3

## 2018-10-07 DIAGNOSIS — I10 Essential (primary) hypertension: Secondary | ICD-10-CM | POA: Diagnosis not present

## 2018-10-07 DIAGNOSIS — E119 Type 2 diabetes mellitus without complications: Secondary | ICD-10-CM | POA: Diagnosis not present

## 2018-10-22 DIAGNOSIS — E119 Type 2 diabetes mellitus without complications: Secondary | ICD-10-CM | POA: Diagnosis not present

## 2018-10-22 DIAGNOSIS — I651 Occlusion and stenosis of basilar artery: Secondary | ICD-10-CM | POA: Diagnosis not present

## 2018-10-22 DIAGNOSIS — R413 Other amnesia: Secondary | ICD-10-CM | POA: Diagnosis not present

## 2018-10-22 DIAGNOSIS — Z8673 Personal history of transient ischemic attack (TIA), and cerebral infarction without residual deficits: Secondary | ICD-10-CM | POA: Diagnosis not present

## 2018-10-22 DIAGNOSIS — S158XXA Injury of other specified blood vessels at neck level, initial encounter: Secondary | ICD-10-CM | POA: Diagnosis not present

## 2018-10-22 DIAGNOSIS — M8500 Fibrous dysplasia (monostotic), unspecified site: Secondary | ICD-10-CM | POA: Diagnosis not present

## 2018-10-22 DIAGNOSIS — G8194 Hemiplegia, unspecified affecting left nondominant side: Secondary | ICD-10-CM | POA: Diagnosis not present

## 2018-11-02 DIAGNOSIS — Z8673 Personal history of transient ischemic attack (TIA), and cerebral infarction without residual deficits: Secondary | ICD-10-CM | POA: Diagnosis not present

## 2018-11-02 DIAGNOSIS — M542 Cervicalgia: Secondary | ICD-10-CM | POA: Diagnosis not present

## 2018-11-02 DIAGNOSIS — R413 Other amnesia: Secondary | ICD-10-CM | POA: Diagnosis not present

## 2018-11-02 DIAGNOSIS — S158XXA Injury of other specified blood vessels at neck level, initial encounter: Secondary | ICD-10-CM | POA: Diagnosis not present

## 2018-11-02 DIAGNOSIS — G9389 Other specified disorders of brain: Secondary | ICD-10-CM | POA: Diagnosis not present

## 2018-11-02 DIAGNOSIS — I6782 Cerebral ischemia: Secondary | ICD-10-CM | POA: Diagnosis not present

## 2018-11-02 DIAGNOSIS — M8500 Fibrous dysplasia (monostotic), unspecified site: Secondary | ICD-10-CM | POA: Diagnosis not present

## 2018-11-02 MED FILL — CLOPIDOGREL 75 MG TABLET: 75 | 30 days supply | Qty: 30 | Fill #4

## 2018-11-02 MED FILL — JARDIANCE 25 MG TABLET: 25 | 30 days supply | Qty: 30 | Fill #3

## 2018-11-02 MED FILL — JANUMET XR 50-1,000 MG TAB: 50-1000 | 30 days supply | Qty: 60 | Fill #4

## 2018-11-03 MED FILL — glipiZIDE ER 5 MG TB24: 5 | 90 days supply | Qty: 90 | Fill #0

## 2018-11-05 MED FILL — LISINOPRIL-HCTZ 20-12.5 MG: 20-12.5 | 90 days supply | Qty: 180 | Fill #1

## 2018-11-30 MED FILL — CLOPIDOGREL 75 MG TABLET: 75 | 30 days supply | Qty: 30 | Fill #5

## 2018-11-30 MED FILL — JARDIANCE 25 MG TABLET: 25 | 30 days supply | Qty: 30 | Fill #4

## 2018-11-30 MED FILL — JANUMET XR 50-1,000 MG TAB: 50-1000 | 30 days supply | Qty: 60 | Fill #0

## 2018-12-27 ENCOUNTER — Other Ambulatory Visit: Payer: Self-pay | Admitting: Internal Medicine

## 2018-12-27 DIAGNOSIS — E119 Type 2 diabetes mellitus without complications: Secondary | ICD-10-CM

## 2018-12-27 MED FILL — ATORVASTATIN 80 MG TABLET: 80 | 90 days supply | Qty: 90 | Fill #0

## 2018-12-27 MED FILL — JARDIANCE 25 MG TABLET: 25 | 30 days supply | Qty: 30 | Fill #5

## 2018-12-27 MED FILL — CLOPIDOGREL 75 MG TABLET: 75 | 30 days supply | Qty: 30 | Fill #6

## 2018-12-30 MED FILL — JANUMET XR 50-1,000 MG TAB: 50-1000 | 30 days supply | Qty: 60 | Fill #0

## 2019-01-06 DIAGNOSIS — E119 Type 2 diabetes mellitus without complications: Secondary | ICD-10-CM | POA: Diagnosis not present

## 2019-01-06 DIAGNOSIS — H6123 Impacted cerumen, bilateral: Secondary | ICD-10-CM | POA: Diagnosis not present

## 2019-02-01 MED FILL — LISINOPRIL-HCTZ 20-12.5 MG: 20-12.5 | 90 days supply | Qty: 180 | Fill #2

## 2019-02-01 MED FILL — JANUMET XR 50-1,000 MG TAB: 50-1000 | 30 days supply | Qty: 60 | Fill #1

## 2019-02-01 MED FILL — CLOPIDOGREL 75 MG TABLET: 75 | 30 days supply | Qty: 30 | Fill #7

## 2019-02-01 MED FILL — JARDIANCE 25 MG TABLET: 25 | 30 days supply | Qty: 30 | Fill #6

## 2019-02-01 MED FILL — glipiZIDE ER 5 MG TB24: 5 | 90 days supply | Qty: 90 | Fill #1

## 2019-03-01 MED FILL — JARDIANCE 25 MG TABLET: 25 | 30 days supply | Qty: 30 | Fill #7

## 2019-03-01 MED FILL — JANUMET XR 50-1,000 MG TAB: 50-1000 | 30 days supply | Qty: 60 | Fill #2

## 2019-03-14 ENCOUNTER — Other Ambulatory Visit: Payer: Self-pay | Admitting: Internal Medicine

## 2019-03-14 DIAGNOSIS — I6359 Cerebral infarction due to unspecified occlusion or stenosis of other cerebral artery: Secondary | ICD-10-CM

## 2019-03-15 MED FILL — CLOPIDOGREL 75 MG TABLET: 75 | 30 days supply | Qty: 30 | Fill #0

## 2019-03-23 DIAGNOSIS — L814 Other melanin hyperpigmentation: Secondary | ICD-10-CM | POA: Diagnosis not present

## 2019-03-23 DIAGNOSIS — Z7984 Long term (current) use of oral hypoglycemic drugs: Secondary | ICD-10-CM | POA: Diagnosis not present

## 2019-03-23 DIAGNOSIS — H524 Presbyopia: Secondary | ICD-10-CM | POA: Diagnosis not present

## 2019-03-23 DIAGNOSIS — E119 Type 2 diabetes mellitus without complications: Secondary | ICD-10-CM | POA: Diagnosis not present

## 2019-03-23 DIAGNOSIS — H04123 Dry eye syndrome of bilateral lacrimal glands: Secondary | ICD-10-CM | POA: Diagnosis not present

## 2019-03-23 DIAGNOSIS — H2513 Age-related nuclear cataract, bilateral: Secondary | ICD-10-CM | POA: Diagnosis not present

## 2019-03-29 MED FILL — JARDIANCE 25 MG TABLET: 25 | 30 days supply | Qty: 30 | Fill #8

## 2019-03-29 MED FILL — ATORVASTATIN 80 MG TABLET: 80 | 30 days supply | Qty: 30 | Fill #1

## 2019-04-01 MED FILL — JANUMET XR 50-1,000 MG TAB: 50-1000 | 30 days supply | Qty: 60 | Fill #3

## 2019-04-04 MED FILL — JENTADUETO 2.5 MG-1000 MG T: 2.5-1000 | 30 days supply | Qty: 60 | Fill #0

## 2019-04-21 MED FILL — CLOPIDOGREL 75 MG TABLET: 75 | 30 days supply | Qty: 30 | Fill #1

## 2019-05-04 MED FILL — JENTADUETO 2.5 MG-1000 MG T: 2.5-1000 | 30 days supply | Qty: 60 | Fill #0

## 2019-05-04 MED FILL — glipiZIDE ER 5 MG TB24: 5 | 30 days supply | Qty: 30 | Fill #2

## 2019-05-04 MED FILL — ATORVASTATIN 80 MG TABLET: 80 | 30 days supply | Qty: 30 | Fill #2

## 2019-05-04 MED FILL — LISINOPRIL-HCTZ 20-12.5 MG: 20-12.5 | 30 days supply | Qty: 60 | Fill #3

## 2019-05-05 MED FILL — JARDIANCE 25 MG TABLET: 25 | 30 days supply | Qty: 30 | Fill #0

## 2019-05-18 DIAGNOSIS — S0990XD Unspecified injury of head, subsequent encounter: Secondary | ICD-10-CM | POA: Diagnosis not present

## 2019-05-18 DIAGNOSIS — R413 Other amnesia: Secondary | ICD-10-CM | POA: Diagnosis not present

## 2019-05-18 DIAGNOSIS — M8500 Fibrous dysplasia (monostotic), unspecified site: Secondary | ICD-10-CM | POA: Diagnosis not present

## 2019-05-18 MED FILL — MODAFINIL 200 MG TABLET: 200 | 30 days supply | Qty: 30 | Fill #0

## 2019-05-23 MED FILL — CLOPIDOGREL 75 MG TABLET: 75 | 30 days supply | Qty: 30 | Fill #2

## 2019-06-02 MED FILL — glipiZIDE ER 5 MG TB24: 5 | 30 days supply | Qty: 30 | Fill #3

## 2019-06-02 MED FILL — LISINOPRIL-HCTZ 20-12.5 MG: 20-12.5 | 30 days supply | Qty: 60 | Fill #4

## 2019-06-02 MED FILL — JARDIANCE 25 MG TABLET: 25 | 30 days supply | Qty: 30 | Fill #1

## 2019-06-02 MED FILL — ATORVASTATIN 80 MG TABLET: 80 | 30 days supply | Qty: 30 | Fill #3

## 2019-06-02 MED FILL — JENTADUETO 2.5 MG-1000 MG T: 2.5-1000 | 30 days supply | Qty: 60 | Fill #1

## 2019-06-21 MED FILL — CLOPIDOGREL 75 MG TABLET: 75 | 30 days supply | Qty: 30 | Fill #3

## 2019-07-05 MED FILL — JARDIANCE 25 MG TABLET: 25 | 30 days supply | Qty: 30 | Fill #2

## 2019-07-05 MED FILL — LISINOPRIL-HCTZ 20-12.5 MG: 20-12.5 | 30 days supply | Qty: 60 | Fill #5

## 2019-07-05 MED FILL — glipiZIDE ER 5 MG TB24: 5 | 30 days supply | Qty: 30 | Fill #4

## 2019-07-05 MED FILL — JENTADUETO 2.5 MG-1000 MG T: 2.5-1000 | 30 days supply | Qty: 60 | Fill #2

## 2019-07-05 MED FILL — ATORVASTATIN 80 MG TABLET: 80 | 30 days supply | Qty: 30 | Fill #4

## 2019-07-20 MED FILL — CLOPIDOGREL 75 MG TABLET: 75 | 30 days supply | Qty: 30 | Fill #4

## 2019-08-03 MED FILL — MODAFINIL 200 MG TABS: 200 | 30 days supply | Qty: 30 | Fill #1

## 2019-08-03 MED FILL — JARDIANCE 25 MG TABLET: 25 | 30 days supply | Qty: 30 | Fill #3

## 2019-08-03 MED FILL — JENTADUETO 2.5 MG-1000 MG T: 2.5-1000 | 30 days supply | Qty: 60 | Fill #3

## 2019-08-03 MED FILL — LISINOPRIL-HCTZ 20-12.5 MG: 20-12.5 | 30 days supply | Qty: 60 | Fill #0

## 2019-08-03 MED FILL — ATORVASTATIN 80 MG TABLET: 80 | 30 days supply | Qty: 30 | Fill #5

## 2019-08-03 MED FILL — glipiZIDE ER 5 MG TB24: 5 | 30 days supply | Qty: 30 | Fill #5

## 2019-08-12 MED FILL — LISINOPRIL-HCTZ 20-12.5 MG: 20-12.5 | 30 days supply | Qty: 60 | Fill #0

## 2019-08-25 MED FILL — CLOPIDOGREL 75 MG TABLET: 75 | 30 days supply | Qty: 30 | Fill #5

## 2019-09-02 MED FILL — JENTADUETO 2.5 MG-1000 MG T: 2.5-1000 | 30 days supply | Qty: 60 | Fill #4

## 2019-09-02 MED FILL — JARDIANCE 25 MG TABLET: 25 | 30 days supply | Qty: 30 | Fill #4

## 2019-09-02 MED FILL — glipiZIDE ER 5 MG TB24: 5 | 30 days supply | Qty: 30 | Fill #6

## 2019-09-02 MED FILL — MODAFINIL 200 MG TABS: 200 | 30 days supply | Qty: 30 | Fill #2

## 2019-09-02 MED FILL — ATORVASTATIN 80 MG TABLET: 80 | 30 days supply | Qty: 30 | Fill #6

## 2019-09-26 MED FILL — SILDENAFIL CITRATE 25 MG TA: 25 | 5 days supply | Qty: 5 | Fill #0

## 2019-09-26 MED FILL — ATORVASTATIN 80 MG TABLET: 80 | 90 days supply | Qty: 90 | Fill #0

## 2019-09-26 MED FILL — CLOPIDOGREL 75 MG TABLET: 75 | 30 days supply | Qty: 30 | Fill #6

## 2019-09-29 MED FILL — JARDIANCE 25 MG TABLET: 25 | 30 days supply | Qty: 30 | Fill #5

## 2019-09-29 MED FILL — MODAFINIL 200 MG TABS: 200 | 30 days supply | Qty: 30 | Fill #3

## 2019-09-29 MED FILL — SILDENAFIL CITRATE 25 MG TA: 25 | 5 days supply | Qty: 5 | Fill #1

## 2019-09-29 MED FILL — glipiZIDE ER 5 MG TB24: 5 | 30 days supply | Qty: 30 | Fill #7

## 2019-09-29 MED FILL — LISINOPRIL-HCTZ 20-12.5 MG: 20-12.5 | 30 days supply | Qty: 60 | Fill #1

## 2019-09-29 MED FILL — JENTADUETO 2.5 MG-1000 MG T: 2.5-1000 | 30 days supply | Qty: 60 | Fill #5

## 2019-10-03 MED FILL — JANUMET XR 50-1,000 MG TAB: 50-1000 | 30 days supply | Qty: 60 | Fill #4

## 2019-10-04 ENCOUNTER — Other Ambulatory Visit (HOSPITAL_BASED_OUTPATIENT_CLINIC_OR_DEPARTMENT_OTHER): Payer: Self-pay | Admitting: Physician Assistant

## 2019-10-21 MED FILL — CLOPIDOGREL 75 MG TABLET: 75 | 30 days supply | Qty: 30 | Fill #7

## 2019-10-27 MED FILL — JARDIANCE 25 MG TABLET: 25 | 30 days supply | Qty: 30 | Fill #6

## 2019-10-27 MED FILL — JANUMET XR 50-1,000 MG TAB: 50-1000 | 30 days supply | Qty: 60 | Fill #5

## 2019-10-27 MED FILL — LISINOPRIL-HCTZ 20-12.5 MG: 20-12.5 | 30 days supply | Qty: 60 | Fill #2

## 2019-10-27 MED FILL — glipiZIDE ER 5 MG TB24: 5 | 30 days supply | Qty: 30 | Fill #0

## 2019-11-29 MED FILL — CLOPIDOGREL 75 MG TABLET: 75 | 30 days supply | Qty: 30 | Fill #8

## 2019-11-29 MED FILL — glipiZIDE ER 5 MG TB24: 5 | 30 days supply | Qty: 30 | Fill #1

## 2019-11-29 MED FILL — JANUMET XR 50-1,000 MG TAB: 50-1000 | 30 days supply | Qty: 60 | Fill #6

## 2019-11-29 MED FILL — JARDIANCE 25 MG TABLET: 25 | 30 days supply | Qty: 30 | Fill #7

## 2019-11-29 MED FILL — LISINOPRIL-HCTZ 20-12.5 MG: 20-12.5 | 30 days supply | Qty: 60 | Fill #3

## 2019-12-26 ENCOUNTER — Other Ambulatory Visit (HOSPITAL_BASED_OUTPATIENT_CLINIC_OR_DEPARTMENT_OTHER): Payer: Self-pay | Admitting: Physician Assistant

## 2019-12-26 MED FILL — JARDIANCE 25 MG TABLET: 25 | 30 days supply | Qty: 30 | Fill #0

## 2020-01-04 MED FILL — glipiZIDE ER 5 MG TB24: 5 | 30 days supply | Qty: 30 | Fill #2

## 2020-01-04 MED FILL — LISINOPRIL-HCTZ 20-12.5 MG: 20-12.5 | 30 days supply | Qty: 60 | Fill #4

## 2020-01-04 MED FILL — JARDIANCE 25 MG TABLET: 25 | 30 days supply | Qty: 30 | Fill #8

## 2020-01-04 MED FILL — JANUMET XR 50-1,000 MG TAB: 50-1000 | 30 days supply | Qty: 60 | Fill #0

## 2020-01-04 MED FILL — CLOPIDOGREL 75 MG TABLET: 75 | 30 days supply | Qty: 30 | Fill #9

## 2020-02-01 MED FILL — glipiZIDE ER 5 MG TB24: 5 | 30 days supply | Qty: 30 | Fill #3

## 2020-02-01 MED FILL — JARDIANCE 25 MG TABLET: 25 | 30 days supply | Qty: 30 | Fill #0

## 2020-02-01 MED FILL — CLOPIDOGREL 75 MG TABLET: 75 | 30 days supply | Qty: 30 | Fill #10

## 2020-02-01 MED FILL — LISINOPRIL-HCTZ 20-12.5 MG: 20-12.5 | 30 days supply | Qty: 60 | Fill #5

## 2020-02-01 MED FILL — JANUMET XR 50-1,000 MG TAB: 50-1000 | 30 days supply | Qty: 60 | Fill #1

## 2020-02-01 MED FILL — ATORVASTATIN 80 MG TABLET: 80 | 90 days supply | Qty: 90 | Fill #1

## 2020-02-28 MED FILL — JARDIANCE 25 MG TABLET: 25 | 30 days supply | Qty: 30 | Fill #1

## 2020-02-28 MED FILL — LISINOPRIL-HCTZ 20-12.5 MG: 20-12.5 | 30 days supply | Qty: 60 | Fill #6

## 2020-02-28 MED FILL — CLOPIDOGREL 75 MG TABLET: 75 | 30 days supply | Qty: 30 | Fill #11

## 2020-02-28 MED FILL — JANUMET XR 50-1,000 MG TAB: 50-1000 | 30 days supply | Qty: 60 | Fill #2

## 2020-03-12 ENCOUNTER — Other Ambulatory Visit (HOSPITAL_COMMUNITY): Payer: Self-pay | Admitting: Physician Assistant

## 2020-03-12 MED FILL — glipiZIDE ER 5 MG TB24: 5 | 30 days supply | Qty: 30 | Fill #0

## 2020-03-26 MED FILL — SM ATHLETE'S 1% FOOT CREAM: 1 % | 14 days supply | Qty: 30 | Fill #0

## 2020-04-04 MED FILL — JARDIANCE 25 MG TABLET: 25 | 30 days supply | Qty: 30 | Fill #2

## 2020-04-04 MED FILL — CLOPIDOGREL 75 MG TABLET: 75 | 30 days supply | Qty: 30 | Fill #0

## 2020-04-04 MED FILL — LISINOPRIL-HCTZ 20-12.5 MG: 20-12.5 | 30 days supply | Qty: 60 | Fill #7

## 2020-04-04 MED FILL — JANUMET XR 50-1,000 MG TAB: 50-1000 | 30 days supply | Qty: 60 | Fill #3

## 2020-04-05 MED FILL — glipiZIDE ER 5 MG TB24: 5 | 30 days supply | Qty: 30 | Fill #1

## 2020-04-16 MED FILL — SM ATHLETE'S 1% FOOT CREAM: 1 % | 14 days supply | Qty: 30 | Fill #0

## 2020-04-16 MED FILL — glipiZIDE ER 5 MG TB24: 5 | 30 days supply | Qty: 30 | Fill #1

## 2020-05-02 MED FILL — ATORVASTATIN 80 MG TABLET: 80 | 90 days supply | Qty: 90 | Fill #2

## 2020-05-02 MED FILL — JANUMET XR 50-1,000 MG TAB: 50-1000 | 30 days supply | Qty: 60 | Fill #4

## 2020-05-02 MED FILL — CLOPIDOGREL 75 MG TABLET: 75 | 30 days supply | Qty: 30 | Fill #1

## 2020-05-02 MED FILL — LISINOPRIL-HCTZ 20-12.5 MG: 20-12.5 | 30 days supply | Qty: 60 | Fill #8

## 2020-05-02 MED FILL — JARDIANCE 25 MG TABLET: 25 | 30 days supply | Qty: 30 | Fill #3

## 2020-05-15 MED FILL — glipiZIDE ER 5 MG TB24: 5 | 30 days supply | Qty: 30 | Fill #2

## 2020-05-30 MED FILL — JARDIANCE 25 MG TABLET: 25 | 30 days supply | Qty: 30 | Fill #4

## 2020-05-30 MED FILL — LISINOPRIL-HCTZ 20-12.5 MG: 20-12.5 | 30 days supply | Qty: 60 | Fill #9

## 2020-05-30 MED FILL — JANUMET XR 50-1,000 MG TAB: 50-1000 | 30 days supply | Qty: 60 | Fill #5

## 2020-05-30 MED FILL — CLOPIDOGREL 75 MG TABLET: 75 | 30 days supply | Qty: 30 | Fill #2

## 2020-06-16 MED FILL — GLIPIZIDE ER 5 MG TB24: 5 | 30 days supply | Qty: 30 | Fill #0

## 2020-07-03 MED FILL — JANUMET XR 50-1,000 MG TAB: 50-1000 | 30 days supply | Qty: 60 | Fill #6

## 2020-07-04 MED FILL — JARDIANCE 25 MG TABLET: 25 | 30 days supply | Qty: 30 | Fill #5

## 2020-07-04 MED FILL — CLOPIDOGREL 75 MG TABLET: 75 | 30 days supply | Qty: 30 | Fill #3

## 2020-07-04 MED FILL — LISINOPRIL-HCTZ 20-12.5 MG: 20-12.5 | 30 days supply | Qty: 60 | Fill #10

## 2020-07-16 ENCOUNTER — Other Ambulatory Visit (HOSPITAL_BASED_OUTPATIENT_CLINIC_OR_DEPARTMENT_OTHER): Payer: Self-pay | Admitting: Physician Assistant

## 2020-07-16 MED FILL — glipiZIDE ER 5 MG TB24: 5 | 30 days supply | Qty: 30 | Fill #0

## 2020-08-01 ENCOUNTER — Other Ambulatory Visit (HOSPITAL_BASED_OUTPATIENT_CLINIC_OR_DEPARTMENT_OTHER): Payer: Self-pay | Admitting: Physician Assistant

## 2020-08-01 MED FILL — JARDIANCE 25 MG TABLET: 25 | 30 days supply | Qty: 30 | Fill #6

## 2020-08-01 MED FILL — ATORVASTATIN 80 MG TABLET: 80 | 90 days supply | Qty: 90 | Fill #3

## 2020-08-01 MED FILL — LISINOPRIL-HCTZ 20-12.5 MG: 20-12.5 | 30 days supply | Qty: 60 | Fill #11

## 2020-08-01 MED FILL — JANUMET XR 50-1,000 MG TAB: 50-1000 | 30 days supply | Qty: 60 | Fill #7

## 2020-08-01 MED FILL — CLOPIDOGREL 75 MG TABLET: 75 | 30 days supply | Qty: 30 | Fill #0

## 2020-08-20 MED FILL — glipiZIDE ER 5 MG TB24: 5 | 30 days supply | Qty: 30 | Fill #3

## 2020-08-29 ENCOUNTER — Other Ambulatory Visit (HOSPITAL_BASED_OUTPATIENT_CLINIC_OR_DEPARTMENT_OTHER): Payer: Self-pay | Admitting: Physician Assistant

## 2020-08-29 MED FILL — LISINOPRIL-HCTZ 20-12.5 MG: 20-12.5 | 30 days supply | Qty: 60 | Fill #0

## 2020-08-29 MED FILL — JANUMET XR 50-1,000 MG TAB: 50-1000 | 30 days supply | Qty: 60 | Fill #8

## 2020-08-29 MED FILL — CLOPIDOGREL 75 MG TABLET: 75 | 30 days supply | Qty: 30 | Fill #1

## 2020-08-29 MED FILL — JARDIANCE 25 MG TABLET: 25 | 30 days supply | Qty: 30 | Fill #7

## 2020-09-04 ENCOUNTER — Other Ambulatory Visit (HOSPITAL_BASED_OUTPATIENT_CLINIC_OR_DEPARTMENT_OTHER): Payer: Self-pay | Admitting: Physician Assistant

## 2020-09-24 MED FILL — CLOPIDOGREL 75 MG TABLET: 75 | 30 days supply | Qty: 30 | Fill #2

## 2020-09-24 MED FILL — JARDIANCE 25 MG TABLET: 25 | 30 days supply | Qty: 30 | Fill #8

## 2020-09-24 MED FILL — LISINOPRIL-HCTZ 20-12.5 MG: 20-12.5 | 30 days supply | Qty: 60 | Fill #1

## 2020-09-24 MED FILL — JANUMET XR 50-1,000 MG TAB: 50-1000 | 30 days supply | Qty: 60 | Fill #9

## 2020-09-24 MED FILL — glipiZIDE ER 5 MG TB24: 5 | 30 days supply | Qty: 30 | Fill #0

## 2020-10-23 ENCOUNTER — Inpatient Hospital Stay (HOSPITAL_BASED_OUTPATIENT_CLINIC_OR_DEPARTMENT_OTHER)
Admission: EM | Admit: 2020-10-23 | Discharge: 2020-10-26 | DRG: 871 | Disposition: A | Discharge status: Home / Self Care | Payer: Medicare Other | Attending: Internal Medicine | Admitting: Internal Medicine

## 2020-10-23 ENCOUNTER — Emergency Department (HOSPITAL_BASED_OUTPATIENT_CLINIC_OR_DEPARTMENT_OTHER): Payer: Medicare Other

## 2020-10-23 ENCOUNTER — Other Ambulatory Visit: Payer: Self-pay

## 2020-10-23 ENCOUNTER — Encounter (HOSPITAL_BASED_OUTPATIENT_CLINIC_OR_DEPARTMENT_OTHER): Payer: Self-pay | Admitting: Emergency Medicine

## 2020-10-23 DIAGNOSIS — Z8673 Personal history of transient ischemic attack (TIA), and cerebral infarction without residual deficits: Secondary | ICD-10-CM | POA: Diagnosis not present

## 2020-10-23 DIAGNOSIS — I10 Essential (primary) hypertension: Secondary | ICD-10-CM | POA: Diagnosis not present

## 2020-10-23 DIAGNOSIS — E1165 Type 2 diabetes mellitus with hyperglycemia: Secondary | ICD-10-CM | POA: Diagnosis present

## 2020-10-23 DIAGNOSIS — N170 Acute kidney failure with tubular necrosis: Secondary | ICD-10-CM | POA: Diagnosis present

## 2020-10-23 DIAGNOSIS — I110 Hypertensive heart disease with heart failure: Secondary | ICD-10-CM | POA: Diagnosis present

## 2020-10-23 DIAGNOSIS — Z7984 Long term (current) use of oral hypoglycemic drugs: Secondary | ICD-10-CM

## 2020-10-23 DIAGNOSIS — Z7902 Long term (current) use of antithrombotics/antiplatelets: Secondary | ICD-10-CM

## 2020-10-23 DIAGNOSIS — E119 Type 2 diabetes mellitus without complications: Secondary | ICD-10-CM | POA: Diagnosis not present

## 2020-10-23 DIAGNOSIS — R6521 Severe sepsis with septic shock: Secondary | ICD-10-CM | POA: Diagnosis present

## 2020-10-23 DIAGNOSIS — E872 Acidosis: Secondary | ICD-10-CM | POA: Diagnosis present

## 2020-10-23 DIAGNOSIS — Z8249 Family history of ischemic heart disease and other diseases of the circulatory system: Secondary | ICD-10-CM | POA: Diagnosis not present

## 2020-10-23 DIAGNOSIS — Z79899 Other long term (current) drug therapy: Secondary | ICD-10-CM

## 2020-10-23 DIAGNOSIS — Z83438 Family history of other disorder of lipoprotein metabolism and other lipidemia: Secondary | ICD-10-CM

## 2020-10-23 DIAGNOSIS — Z6828 Body mass index (BMI) 28.0-28.9, adult: Secondary | ICD-10-CM

## 2020-10-23 DIAGNOSIS — I5032 Chronic diastolic (congestive) heart failure: Secondary | ICD-10-CM | POA: Diagnosis present

## 2020-10-23 DIAGNOSIS — J690 Pneumonitis due to inhalation of food and vomit: Secondary | ICD-10-CM | POA: Diagnosis present

## 2020-10-23 DIAGNOSIS — E663 Overweight: Secondary | ICD-10-CM | POA: Diagnosis present

## 2020-10-23 DIAGNOSIS — J189 Pneumonia, unspecified organism: Secondary | ICD-10-CM | POA: Diagnosis not present

## 2020-10-23 DIAGNOSIS — Z20822 Contact with and (suspected) exposure to covid-19: Secondary | ICD-10-CM | POA: Diagnosis present

## 2020-10-23 DIAGNOSIS — R7989 Other specified abnormal findings of blood chemistry: Secondary | ICD-10-CM | POA: Diagnosis present

## 2020-10-23 DIAGNOSIS — A419 Sepsis, unspecified organism: Principal | ICD-10-CM | POA: Diagnosis present

## 2020-10-23 DIAGNOSIS — N179 Acute kidney failure, unspecified: Secondary | ICD-10-CM | POA: Diagnosis not present

## 2020-10-23 LAB — URINALYSIS, ROUTINE W REFLEX MICROSCOPIC
Bilirubin Urine: NEGATIVE
Glucose, UA: >=500 mg/dL — AB
Hgb urine dipstick: NEGATIVE
Ketones, ur: NEGATIVE mg/dL
Leukocytes,Ua: NEGATIVE
Nitrite: NEGATIVE
Protein, ur: NEGATIVE mg/dL
Specific Gravity, Urine: 1.01 (ref 1.005–1.030)
pH: 5 (ref 5.0–8.0)

## 2020-10-23 LAB — COMPREHENSIVE METABOLIC PANEL
ALT: 20 U/L (ref 0–44)
AST: 33 U/L (ref 15–41)
Albumin: 4 g/dL (ref 3.5–5.0)
Alkaline Phosphatase: 53 U/L (ref 38–126)
Anion gap: 16 — ABNORMAL HIGH (ref 5–15)
BUN: 28 mg/dL — ABNORMAL HIGH (ref 6–20)
CO2: 19 mmol/L — ABNORMAL LOW (ref 22–32)
Calcium: 9 mg/dL (ref 8.9–10.3)
Chloride: 101 mmol/L (ref 98–111)
Creatinine, Ser: 1.69 mg/dL — ABNORMAL HIGH (ref 0.61–1.24)
GFR, Estimated: 48 mL/min — ABNORMAL LOW (ref >60)
Glucose, Bld: 286 mg/dL — ABNORMAL HIGH (ref 70–99)
Potassium: 4.1 mmol/L (ref 3.5–5.1)
Sodium: 136 mmol/L (ref 135–145)
Total Bilirubin: 1.3 mg/dL — ABNORMAL HIGH (ref 0.3–1.2)
Total Protein: 7.3 g/dL (ref 6.5–8.1)

## 2020-10-23 LAB — URINALYSIS, MICROSCOPIC (REFLEX)

## 2020-10-23 LAB — CBC WITH DIFFERENTIAL/PLATELET
Abs Immature Granulocytes: 0.04 10*3/uL (ref 0.00–0.07)
Basophils Absolute: 0 10*3/uL (ref 0.0–0.1)
Basophils Relative: 0 %
Eosinophils Absolute: 0 10*3/uL (ref 0.0–0.5)
Eosinophils Relative: 0 %
HCT: 38.9 % — ABNORMAL LOW (ref 39.0–52.0)
Hemoglobin: 12.5 g/dL — ABNORMAL LOW (ref 13.0–17.0)
Immature Granulocytes: 1 %
Lymphocytes Relative: 9 %
Lymphs Abs: 0.7 10*3/uL (ref 0.7–4.0)
MCH: 26.8 pg (ref 26.0–34.0)
MCHC: 32.1 g/dL (ref 30.0–36.0)
MCV: 83.5 fL (ref 80.0–100.0)
Monocytes Absolute: 0.5 10*3/uL (ref 0.1–1.0)
Monocytes Relative: 6 %
Neutro Abs: 6.6 10*3/uL (ref 1.7–7.7)
Neutrophils Relative %: 84 %
Platelets: 265 10*3/uL (ref 150–400)
RBC: 4.66 MIL/uL (ref 4.22–5.81)
RDW: 15.5 % (ref 11.5–15.5)
WBC: 7.9 10*3/uL (ref 4.0–10.5)
nRBC: 0 % (ref 0.0–0.2)

## 2020-10-23 LAB — LACTIC ACID, PLASMA
Lactic Acid, Venous: 5.3 mmol/L (ref 0.5–1.9)
Lactic Acid, Venous: 4.9 mmol/L (ref 0.5–1.9)

## 2020-10-23 LAB — GLUCOSE, CAPILLARY: Glucose-Capillary: 138 mg/dL — ABNORMAL HIGH (ref 70–99)

## 2020-10-23 LAB — SARS CORONAVIRUS 2 BY RT PCR (HOSPITAL ORDER, PERFORMED IN ~~LOC~~ HOSPITAL LAB): SARS Coronavirus 2: NEGATIVE

## 2020-10-23 LAB — CBG MONITORING, ED
Glucose-Capillary: 149 mg/dL — ABNORMAL HIGH (ref 70–99)
Glucose-Capillary: 147 mg/dL — ABNORMAL HIGH (ref 70–99)

## 2020-10-23 MED ORDER — ATORVASTATIN CALCIUM 40 MG PO TABS
80.0000 mg | ORAL_TABLET | Freq: Every day | ORAL | Status: DC
  Administered 2020-10-24 – 2020-10-26 (×3): 80 mg via ORAL
  Filled 2020-10-23 (×3): qty 2

## 2020-10-23 MED ORDER — LACTATED RINGERS IV BOLUS (SEPSIS)
1000.0000 mL | Freq: Once | INTRAVENOUS | Status: AC
  Administered 2020-10-23: 1000 mL via INTRAVENOUS

## 2020-10-23 MED ORDER — LACTATED RINGERS IV BOLUS
1000.0000 mL | Freq: Once | INTRAVENOUS | Status: AC
  Administered 2020-10-23: 1000 mL via INTRAVENOUS

## 2020-10-23 MED ORDER — SODIUM CHLORIDE 0.9 % IV SOLN
2.0000 g | INTRAVENOUS | Status: DC
  Administered 2020-10-23 – 2020-10-26 (×4): 2 g via INTRAVENOUS
  Filled 2020-10-23: qty 2
  Filled 2020-10-23: qty 20
  Filled 2020-10-23: qty 2
  Filled 2020-10-23: qty 20

## 2020-10-23 MED ORDER — SODIUM CHLORIDE 0.9 % IV SOLN
500.0000 mg | INTRAVENOUS | Status: DC
  Administered 2020-10-23 – 2020-10-25 (×3): 500 mg via INTRAVENOUS
  Filled 2020-10-23 (×3): qty 500

## 2020-10-23 MED ORDER — CLOPIDOGREL BISULFATE 75 MG PO TABS
75.0000 mg | ORAL_TABLET | Freq: Every day | ORAL | Status: DC
  Administered 2020-10-24 – 2020-10-26 (×3): 75 mg via ORAL
  Filled 2020-10-23 (×3): qty 1

## 2020-10-23 MED ORDER — EMPAGLIFLOZIN 10 MG PO TABS
10.0000 mg | ORAL_TABLET | Freq: Every day | ORAL | Status: DC
  Filled 2020-10-23: qty 1

## 2020-10-23 MED ORDER — SODIUM CHLORIDE 0.9 % IV SOLN
INTRAVENOUS | Status: DC | PRN
  Administered 2020-10-23: 500 mL via INTRAVENOUS

## 2020-10-23 MED ORDER — LACTATED RINGERS IV SOLN: INTRAVENOUS | Status: DC

## 2020-10-23 MED ORDER — INSULIN ASPART 100 UNIT/ML ~~LOC~~ SOLN
0.0000 [IU] | Freq: Three times a day (TID) | SUBCUTANEOUS | Status: DC
  Administered 2020-10-24 – 2020-10-25 (×2): 2 [IU] via SUBCUTANEOUS
  Administered 2020-10-25 (×2): 3 [IU] via SUBCUTANEOUS
  Administered 2020-10-26: 2 [IU] via SUBCUTANEOUS

## 2020-10-23 MED ORDER — INSULIN GLARGINE 100 UNIT/ML ~~LOC~~ SOLN
10.0000 [IU] | Freq: Every day | SUBCUTANEOUS | Status: DC
  Administered 2020-10-23 – 2020-10-25 (×3): 10 [IU] via SUBCUTANEOUS
  Filled 2020-10-23 (×3): qty 0.1

## 2020-10-23 MED ORDER — ENOXAPARIN SODIUM 40 MG/0.4ML ~~LOC~~ SOLN
40.0000 mg | SUBCUTANEOUS | Status: DC
  Administered 2020-10-23 – 2020-10-25 (×3): 40 mg via SUBCUTANEOUS
  Filled 2020-10-23 (×3): qty 0.4

## 2020-10-23 NOTE — Progress Notes (Signed)
Received report from Barbaraann Cao, RN. Awaiting arrival of patient to [facility] 1430.

## 2020-10-23 NOTE — ED Notes (Signed)
CBG 147 

## 2020-10-23 NOTE — ED Notes (Signed)
Pts family member is going to bring in patients daily meds to take. EDP Tyrone Nine updated

## 2020-10-23 NOTE — Progress Notes (Signed)
   10/23/20 1811  Vitals  Temp 98.5 F (36.9 C)  Temp Source Oral  BP 114/76  MAP (mmHg) 87  BP Location Left Arm  BP Method Automatic  Patient Position (if appropriate) Lying  Pulse Rate (!) 110  Resp 15  MEWS COLOR  MEWS Score Color Green  Oxygen Therapy  SpO2 99 %  O2 Device Room Air  Pain Assessment  Pain Scale 0-10  Pain Score 0  Arrived to room 1430. Patient walked to the best with assistance. Appears to be unsteady on feet. Patient has prior history of stroke. Per patient this stroke took place in 2017. Patient is alert and oriented x 4. Sensation intact all over. Moving all extremities. Scattered old scars, otherwise skin intact. RR even and unlabored. On room air. Bed low and locked. Paged admission team for admitting orders. Patient remains on LR @ 110 ml/hr. No other needs identified. Will continue to monitor.

## 2020-10-23 NOTE — Plan of Care (Signed)

## 2020-10-23 NOTE — ED Provider Notes (Signed)
I received the patient in signout from Dr. Christy Gentles.  Briefly the patient is a 55 year old male with a chief complaints of subjective fevers and chills cough after having what sounds like an aspiration event.  Found to have a lactic acid of 5.3.  1 blood pressure with a systolic below 90.  He was started on antibiotics code sepsis was initiated and IV fluid.  Patient continues to look well.  Maps have been consistently above 65.  Covid negative.  Will discuss with medicine for admission.   Deno Etienne, DO 10/23/20 1023

## 2020-10-23 NOTE — Progress Notes (Signed)
Notified bedside nurse of need to draw repeat lactic acid. 

## 2020-10-23 NOTE — ED Notes (Addendum)
Pt given crackers and gingerale to take with medications. Pt taking home meds - jardiance, januvia, lisinopril, plavix and glipizide

## 2020-10-23 NOTE — Progress Notes (Signed)
Following for code sepsis 

## 2020-10-23 NOTE — ED Triage Notes (Signed)
Pt began running fever tonight and elevated BS. Pt had tylenol at 0130 and ibuprofen at 0330. Denies any other symptoms. BS at home 344

## 2020-10-23 NOTE — ED Notes (Signed)
Date and time results received: 10/23/20 0652  Test: lactic acid Critical Value: 5.3  Name of Provider Notified: Dr. Christy Gentles  Orders Received? Or Actions Taken?: awaiting further orders

## 2020-10-23 NOTE — H&P (Signed)
History and Physical    Paul Hanson CHE:[id number] DOB: 1966/08/26 DOA: 10/23/2020  PCP: Patient, No Pcp Per (Confirm with patient/family/NH records and if not entered, this has to be entered at [facility] point of entry) Patient coming from: Home/[facility]  I have personally briefly reviewed patient's old medical records in [place]  Chief Complaint: fever, rigors, SOB  HPI: Paul Hanson is a 55 y.o. male with medical history significant of DM, h/o CVA, HTN who chocked on a candy bar last night. During the night he developed fevers, rigors and SOB for which he went to [facility] for evaluation. On presentation her was hypotensive with SBP <90 and was SOB. He felt bad but denies having any chest pain, N/V.   ED Course: T 98.5  114/76  HR 110  RR 15. EDP exam revealed crackles at the right midlung and base. Lab revealed Lacitc acid of 5.3, second Lactic acid was 4.9. CXR vague asymmetric opacity bilaterallly suggestive of multi-focal pneumonia. WBC 7.9 with 84/9/6. Covid NEGATIVE. Code sepsis was called 2/2 tachycardia, tachypbnea, mild AKI and lactic acidosis. He was hydrated with 3L LR. He was administered 2g Rocephing and 500 mg Azithromycin. He was transferred to [facility] for continued treatment.   Review of Systems: As per HPI otherwise 10 point review of systems negative.    Past Medical History:  Diagnosis Date  . Brain tumor ([place])   . Diabetes mellitus without complication ([place]) 5361  . Hypertension   . Stroke [facility])     Past Surgical History:  Procedure Laterality Date  . BRAIN SURGERY    . CRANIOTOMY      Soc Hx - 1st marriage 8 years - 2 adopted daughters, 1 biologic daughter. 2nd marriage 2 years. He is retired after CVA. 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 Lives with his wife.    reports that he has never smoked. He has never used smokeless tobacco. He reports previous alcohol use of about 1.0 - 2.0 standard drink of alcohol per week. He reports  that he does not use drugs.  No Known Allergies  Family History  Problem Relation Age of Onset  . Heart disease Mother   . Hyperlipidemia Father   . Hypertension Father      Prior to Admission medications   Medication Sig Start Date End Date Taking? Authorizing Provider  atorvastatin (LIPITOR) 80 MG tablet TAKE 1 TABLET BY MOUTH DAILY. 09/27/18  Yes Rehman, Areeg N, DO  Blood Glucose Monitoring Suppl (TRUE METRIX METER) w/Device KIT 1 Dose by Does not apply route 4 (four) times daily. 09/02/16  Yes Maryellen Pile, MD  clopidogrel (PLAVIX) 75 MG tablet TAKE 1 TABLET (75 MG TOTAL) BY MOUTH DAILY 03/01/18  Yes Maryellen Pile, MD  empagliflozin (JARDIANCE) 10 MG TABS tablet Take 10 mg by mouth daily. 03/31/18  Yes Axel Filler, MD  glucose blood (TRUE METRIX BLOOD GLUCOSE TEST) test strip Use as instructed up to 4 times daily. IM program 12/25/16  Yes Maryellen Pile, MD  lisinopril-hydrochlorothiazide (PRINZIDE,ZESTORETIC) 20-12.5 MG tablet Take 2 tablets by mouth daily. 08/27/17  Yes Maryellen Pile, MD  SitaGLIPtin-MetFORMIN HCl 50-1000 MG TB24 Take 1 tablet by mouth 2 (two) times daily with a meal. 08/27/17  Yes Maryellen Pile, MD  lisinopril-hydrochlorothiazide (PRINZIDE,ZESTORETIC) 20-12.5 MG tablet TAKE 2 TABLETS BY MOUTH DAILY. 06/29/18   Mike Craze, DO    Physical Exam: Vitals:   10/23/20 1600 10/23/20 1616 10/23/20 1622 10/23/20 1811  BP: 107/74   114/76  Pulse: (!) 109 (!) 109  (!) 110  Resp: (!) 28 (!) 21  15  Temp:   99.8 F (37.7 C) 98.5 F (36.9 C)  TempSrc:    Oral  SpO2: 99% 98%  99%  Weight:      Height:         Vitals:   10/23/20 1600 10/23/20 1616 10/23/20 1622 10/23/20 1811  BP: 107/74   114/76  Pulse: (!) 109 (!) 109  (!) 110  Resp: (!) 28 (!) 21  15  Temp:   99.8 F (37.7 C) 98.5 F (36.9 C)  TempSrc:    Oral  SpO2: 99% 98%  99%  Weight:      Height:       General: WNWD, heavyset man in no distress Eyes: PERRL, lids and  conjunctivae normal ENMT: Mucous membranes are moist. Posterior pharynx clear of any exudate or lesions.Normal dentition.  Neck: normal, supple, no masses, no thyromegaly Respiratory: clear to auscultation bilaterally, no wheezing, ? Feint crackles right base. Normal respiratory effort. No accessory muscle use.  Cardiovascular: Regular rate and rhythm, no murmurs / rubs / gallops. No extremity edema. 2+ pedal pulses. No carotid bruits.  Abdomen: no tenderness, no masses palpated. No hepatosplenomegaly. Bowel sounds positive.  Musculoskeletal: no clubbing / cyanosis. No joint deformity upper and lower extremities. Good ROM, no contractures. Normal muscle tone.  Skin: no rashes, lesions, ulcers. No induration Neurologic: CN 2-12 grossly intact. Sensation intact,  Strength 5/5 in all 4 w/o objective weakness. Marland Kitchen  Psychiatric: Normal judgment and insight. Alert and oriented x 3. Normal mood.     Labs on Admission: I have personally reviewed following labs and imaging studies  CBC: Recent Labs  Lab 10/23/20 0546  WBC 7.9  NEUTROABS 6.6  HGB 12.5*  HCT 38.9*  MCV 83.5  PLT 858   Basic Metabolic Panel: Recent Labs  Lab 10/23/20 0546  NA 136  K 4.1  CL 101  CO2 19*  GLUCOSE 286*  BUN 28*  CREATININE 1.69*  CALCIUM 9.0   GFR: Estimated Creatinine Clearance: 58.9 mL/min (A) (by C-G formula based on SCr of 1.69 mg/dL (H)). Liver Function Tests: Recent Labs  Lab 10/23/20 0546  AST 33  ALT 20  ALKPHOS 53  BILITOT 1.3*  PROT 7.3  ALBUMIN 4.0   No results for input(s): LIPASE, AMYLASE in the last 168 hours. No results for input(s): AMMONIA in the last 168 hours. Coagulation Profile: No results for input(s): INR, PROTIME in the last 168 hours. Cardiac Enzymes: No results for input(s): CKTOTAL, CKMB, CKMBINDEX, TROPONINI in the last 168 hours. BNP (last 3 results) No results for input(s): PROBNP in the last 8760 hours. HbA1C: No results for input(s): HGBA1C in the last 72  hours. CBG: Recent Labs  Lab 10/23/20 1335 10/23/20 1621  GLUCAP 149* 147*   Lipid Profile: No results for input(s): CHOL, HDL, LDLCALC, TRIG, CHOLHDL, LDLDIRECT in the last 72 hours. Thyroid Function Tests: No results for input(s): TSH, T4TOTAL, FREET4, T3FREE, THYROIDAB in the last 72 hours. Anemia Panel: No results for input(s): VITAMINB12, FOLATE, FERRITIN, TIBC, IRON, RETICCTPCT in the last 72 hours. Urine analysis:    Component Value Date/Time   COLORURINE YELLOW 10/23/2020 0546   APPEARANCEUR HAZY (A) 10/23/2020 0546   LABSPEC 1.010 10/23/2020 0546   PHURINE 5.0 10/23/2020 0546   GLUCOSEU >=500 (A) 10/23/2020 0546   HGBUR NEGATIVE 10/23/2020 0546   BILIRUBINUR NEGATIVE 10/23/2020 0546  KETONESUR NEGATIVE 10/23/2020 0546   PROTEINUR NEGATIVE 10/23/2020 0546   UROBILINOGEN 1.0 04/22/2015 1729   NITRITE NEGATIVE 10/23/2020 0546   LEUKOCYTESUR NEGATIVE 10/23/2020 0546    Radiological Exams on Admission: DG Chest Port 1 View  Result Date: 10/23/2020 CLINICAL DATA:  55 year old male with possible sepsis.  Fever. EXAM: PORTABLE CHEST 1 VIEW COMPARISON:  Chest radiographs 04/22/2015. FINDINGS: Portable AP semi upright view at 0526 hours. Improved, larger lung volumes since 2016. Normal cardiac size and mediastinal contours. Visualized tracheal air column is within normal limits. No pneumothorax, pulmonary edema or pleural effusion. Vague asymmetric opacity at the left lateral lung base and about the right hilum. No consolidation. No acute osseous abnormality identified. Negative bowel gas pattern. IMPRESSION: Vague asymmetric opacity in both lungs is nonspecific but suspicious for early multifocal pneumonia in this setting. Electronically Signed   By: Genevie Ann M.D.   On: 10/23/2020 06:05    EKG: Independently reviewed. Sinus tachycardia w/o acute changes  Assessment/Plan Active Problems:   Hypertension   Chronic diastolic heart failure (HCC)   Diabetes mellitus type 2 in  nonobese ([place])   Sepsis ([place])   CAP (community acquired pneumonia)    1. CAP - patient with aspiration pneumonia. He appears stable. WBC is normal but left shift present, abnl CXR Plan Continue Rocephin and Azithromycin  F/u CBCD in AM  F/u 2 view CXR in AM  2. Sepsis 2/2 pneumonia with lactic acidosis, tachycardia and mild AKI. He was fully resuscitated in [place].  3. DM - patient reports adherence to medical regimen PlaN Continue Jardiance  Hold Sulfonyl urea  Sliding scale coverage  Basal insulin at 10 u qHS  A1C  4. Chronic HFpEF - last echo 04/23/15 with EF 29-79%, grade I diastolic dysfxn PlaN Continue home meds including Jardiance  Echo  5. HTN- stable. Continue home meds  DVT prophylaxis: lovenox  Code Status: full code  Family Communication: wife present during exam. Understands Dx and Tx plan. All questions answered  Disposition Plan: home when stable  Consults called: none (with names) Admission status: inpatient    Adella Hare MD [facility] Pager [phone]  If 7PM-7AM, please contact night-coverage [email or web address] Password [facility]  10/23/2020, 7:47 PM

## 2020-10-23 NOTE — ED Provider Notes (Signed)
[facility] EMERGENCY DEPARTMENT Provider Note   CSN: [id number] Arrival date & time: 10/23/20  0449     History Chief Complaint  Patient presents with  . Fever  . Hyperglycemia    Paul Hanson is a 55 y.o. male.  The history is provided by the patient and the spouse.  Fever Severity:  Moderate Onset quality:  Sudden Duration:  5 hours Timing:  Constant Progression:  Improving Chronicity:  New Relieved by:  Ibuprofen Worsened by:  Nothing Associated symptoms: chills   Associated symptoms: no cough, no diarrhea, no dysuria, no headaches, no myalgias, no sore throat and no vomiting   Hyperglycemia Associated symptoms: fever   Associated symptoms: no dysuria and no vomiting   Patient with history of diabetes, stroke, hypertension, previous brain tumor presents with fever and hyperglycemia.  Patient reports that approximately midnight he began having high fevers and took Tylenol and ibuprofen.  He also notes that his blood glucose has been elevated He denies any other acute complaints at this time.  Denies headache, cough, sore throat.  No vomiting or diarrhea. No recent travel.  He is unvaccinated for COVID-19 Of note, wife reports that right before this occurred patient had significant episode of choking while eating candy bar and that triggered coughing and she had to deliver blows to his back to help him cough it up.      Past Medical History:  Diagnosis Date  . Brain tumor ([place])   . Diabetes mellitus without complication ([place]) 3335  . Hypertension   . Stroke [facility])     Patient Active Problem List   Diagnosis Date Noted  . Erectile dysfunction 02/09/2017  . Unresponsive episode 12/31/2016  . Hearing loss 02/04/2016  . Health care maintenance 07/30/2015  . Diabetes mellitus type 2 in nonobese ([employer]) 05/03/2015  . History of CVA (cerebrovascular accident) 04/23/2015  . Chronic diastolic heart failure ([place]) 04/23/2015  . Left hemiparesis ([place])   .  Hypertension 04/22/2015  . History of brain tumor 04/22/2015    Past Surgical History:  Procedure Laterality Date  . BRAIN SURGERY    . CRANIOTOMY         Family History  Problem Relation Age of Onset  . Heart disease Mother   . Hyperlipidemia Father   . Hypertension Father     Social History   Tobacco Use  . Smoking status: Never Smoker  . Smokeless tobacco: Never Used  Substance Use Topics  . Alcohol use: Yes    Alcohol/week: 1.0 - 2.0 standard drink    Types: 1 - 2 Shots of liquor per week    Comment: 3-4 times a year  . Drug use: No    Home Medications Prior to Admission medications   Medication Sig Start Date End Date Taking? Authorizing Provider  atorvastatin (LIPITOR) 80 MG tablet TAKE 1 TABLET BY MOUTH DAILY. 09/27/18   Rehman, Areeg N, DO  Blood Glucose Monitoring Suppl (TRUE METRIX METER) w/Device KIT 1 Dose by Does not apply route 4 (four) times daily. 09/02/16   Maryellen Pile, MD  clopidogrel (PLAVIX) 75 MG tablet TAKE 1 TABLET (75 MG TOTAL) BY MOUTH DAILY 03/01/18   Maryellen Pile, MD  empagliflozin (JARDIANCE) 10 MG TABS tablet Take 10 mg by mouth daily. 03/31/18   Axel Filler, MD  glucose blood (TRUE METRIX BLOOD GLUCOSE TEST) test strip Use as instructed up to 4 times daily. IM program 12/25/16   Maryellen Pile, MD  lisinopril-hydrochlorothiazide (PRINZIDE,ZESTORETIC) 20-12.5 MG tablet  Take 2 tablets by mouth daily. 08/27/17   Maryellen Pile, MD  lisinopril-hydrochlorothiazide (PRINZIDE,ZESTORETIC) 20-12.5 MG tablet TAKE 2 TABLETS BY MOUTH DAILY. 06/29/18   Rehman, Areeg N, DO  SitaGLIPtin-MetFORMIN HCl 50-1000 MG TB24 Take 1 tablet by mouth 2 (two) times daily with a meal. 08/27/17   Maryellen Pile, MD    Allergies    Patient has no known allergies.  Review of Systems   Review of Systems  Constitutional: Positive for chills and fever.  HENT: Negative for sore throat.   Respiratory: Negative for cough.   Gastrointestinal: Negative for  diarrhea and vomiting.  Genitourinary: Negative for dysuria.  Musculoskeletal: Negative for myalgias.  Neurological: Negative for headaches.  All other systems reviewed and are negative.   Physical Exam Updated Vital Signs BP 105/74   Pulse (!) 127   Temp 98.6 F (37 C) (Oral)   Resp 20   Ht 1.803 m ($Remove'5\' 11"'uFIavxs$ )   Wt 93 kg   SpO2 94%   BMI 28.59 kg/m   Physical Exam CONSTITUTIONAL: Well developed/well nourished HEAD: Normocephalic/atraumatic, surgical scars noted- well healed EYES: EOMI/PERRL ENMT: Mucous membranes moist NECK: supple no meningeal signs SPINE/BACK:entire spine nontender CV: S1/S2 noted, tachycardic LUNGS: mild tachypnea, crackles in right mid lung and base ABDOMEN: soft, nontender, no rebound or guarding, bowel sounds noted throughout abdomen GU:no cva tenderness NEURO: Pt is awake/alert/appropriate EXTREMITIES: pulses normal/equal, full ROM SKIN: warm, color normal PSYCH: no abnormalities of mood noted, alert and oriented to situation  ED Results / Procedures / Treatments   Labs (all labs ordered are listed, but only abnormal results are displayed) Labs Reviewed  LACTIC ACID, PLASMA - Abnormal; Notable for the following components:      Result Value   Lactic Acid, Venous 5.3 (*)    All other components within normal limits  COMPREHENSIVE METABOLIC PANEL - Abnormal; Notable for the following components:   CO2 19 (*)    Glucose, Bld 286 (*)    BUN 28 (*)    Creatinine, Ser 1.69 (*)    Total Bilirubin 1.3 (*)    GFR, Estimated 48 (*)    Anion gap 16 (*)    All other components within normal limits  CBC WITH DIFFERENTIAL/PLATELET - Abnormal; Notable for the following components:   Hemoglobin 12.5 (*)    HCT 38.9 (*)    All other components within normal limits  URINALYSIS, ROUTINE W REFLEX MICROSCOPIC - Abnormal; Notable for the following components:   APPearance HAZY (*)    Glucose, UA >=500 (*)    All other components within normal limits   URINALYSIS, MICROSCOPIC (REFLEX) - Abnormal; Notable for the following components:   Bacteria, UA RARE (*)    All other components within normal limits  URINE CULTURE  CULTURE, BLOOD (ROUTINE X 2)  CULTURE, BLOOD (ROUTINE X 2)  SARS CORONAVIRUS 2 BY RT PCR (HOSPITAL ORDER, [facility])  LACTIC ACID, PLASMA    EKG ED ECG REPORT   Date: 10/23/2020 9326  Rate: 106  Rhythm: sinus tachycardia  QRS Axis: normal  Intervals: normal  ST/T Wave abnormalities: nonspecific T wave changes  Conduction Disutrbances:none    I have personally reviewed the EKG tracing and agree with the computerized printout as noted.  Radiology DG Chest Port 1 View  Result Date: 10/23/2020 CLINICAL DATA:  55 year old male with possible sepsis.  Fever. EXAM: PORTABLE CHEST 1 VIEW COMPARISON:  Chest radiographs 04/22/2015. FINDINGS: Portable AP semi upright view at 0526 hours.  Improved, larger lung volumes since 2016. Normal cardiac size and mediastinal contours. Visualized tracheal air column is within normal limits. No pneumothorax, pulmonary edema or pleural effusion. Vague asymmetric opacity at the left lateral lung base and about the right hilum. No consolidation. No acute osseous abnormality identified. Negative bowel gas pattern. IMPRESSION: Vague asymmetric opacity in both lungs is nonspecific but suspicious for early multifocal pneumonia in this setting. Electronically Signed   By: Genevie Ann M.D.   On: 10/23/2020 06:05    Procedures .Critical Care Performed by: Ripley Fraise, MD Authorized by: Ripley Fraise, MD   Critical care provider statement:    Critical care time (minutes):  41   Critical care start time:  10/23/2020 6:00 AM   Critical care end time:  10/23/2020 6:41 AM   Critical care time was exclusive of:  Separately billable procedures and treating other patients   Critical care was necessary to treat or prevent imminent or life-threatening deterioration of the  following conditions:  Respiratory failure and sepsis   Critical care was time spent personally by me on the following activities:  Evaluation of patient's response to treatment, discussions with consultants, development of treatment plan with patient or surrogate, obtaining history from patient or surrogate, examination of patient, re-evaluation of patient's condition, pulse oximetry, ordering and review of radiographic studies, ordering and review of laboratory studies and ordering and performing treatments and interventions   I assumed direction of critical care for this patient from another provider in my specialty: no       Medications Ordered in ED Medications  lactated ringers infusion (has no administration in time range)  lactated ringers bolus 1,000 mL (has no administration in time range)    And  lactated ringers bolus 1,000 mL (has no administration in time range)    And  lactated ringers bolus 1,000 mL (has no administration in time range)  cefTRIAXone (ROCEPHIN) 2 g in sodium chloride 0.9 % 100 mL IVPB (has no administration in time range)  azithromycin (ZITHROMAX) 500 mg in sodium chloride 0.9 % 250 mL IVPB (has no administration in time range)  lactated ringers bolus 1,000 mL (0 mLs Intravenous Stopped 10/23/20 0653)  lactated ringers bolus 1,000 mL (1,000 mLs Intravenous New Bag/Given 10/23/20 3151)    ED Course  I have reviewed the triage vital signs and the nursing notes.  Pertinent labs & imaging results that were available during my care of the patient were reviewed by me and considered in my medical decision making (see chart for details).    MDM Rules/Calculators/A&P                          6:42 AM Pt presents with fever and hyperglycemia He spiked a fever after choking on a candy bar He is awake/alert but tachypnea and crackles on exam but currently not requiring oxygen CXR shows evidence of pneumonia, unclear etiology COVID testing pending at this time Will  await therapeutics until COVID Test results, defer ABX for now IV fluids are infusing 7:02 AM Pt with lactate> 5 Code sepsis called with IV fluids/antibiotics Signed out to Dr Tyrone Nine at shift change, pt will need admitted  Harrington Jobe was evaluated in Emergency Department on 10/23/2020 for the symptoms described in the history of present illness. He was evaluated in the context of the global COVID-19 pandemic, which necessitated consideration that the patient might be at risk for infection with the SARS-CoV-2 virus that causes COVID-19. Institutional  protocols and algorithms that pertain to the evaluation of patients at risk for COVID-19 are in a state of rapid change based on information released by regulatory bodies including the CDC and federal and state organizations. These policies and algorithms were followed during the patient's care in the ED.  Final Clinical Impression(s) / ED Diagnoses Final diagnoses:  None    Rx / DC Orders ED Discharge Orders    None       Ripley Fraise, MD 10/23/20 [phone]

## 2020-10-24 ENCOUNTER — Inpatient Hospital Stay (HOSPITAL_COMMUNITY): Payer: Medicare Other

## 2020-10-24 DIAGNOSIS — N179 Acute kidney failure, unspecified: Secondary | ICD-10-CM

## 2020-10-24 DIAGNOSIS — J690 Pneumonitis due to inhalation of food and vomit: Secondary | ICD-10-CM

## 2020-10-24 DIAGNOSIS — I5032 Chronic diastolic (congestive) heart failure: Secondary | ICD-10-CM | POA: Diagnosis not present

## 2020-10-24 LAB — GLUCOSE, CAPILLARY
Glucose-Capillary: 123 mg/dL — ABNORMAL HIGH (ref 70–99)
Glucose-Capillary: 110 mg/dL — ABNORMAL HIGH (ref 70–99)
Glucose-Capillary: 102 mg/dL — ABNORMAL HIGH (ref 70–99)
Glucose-Capillary: 150 mg/dL — ABNORMAL HIGH (ref 70–99)

## 2020-10-24 LAB — CBC WITH DIFFERENTIAL/PLATELET
Abs Immature Granulocytes: 0.1 10*3/uL — ABNORMAL HIGH (ref 0.00–0.07)
Basophils Absolute: 0.1 10*3/uL (ref 0.0–0.1)
Basophils Relative: 0 %
Eosinophils Absolute: 0.2 10*3/uL (ref 0.0–0.5)
Eosinophils Relative: 2 %
HCT: 38 % — ABNORMAL LOW (ref 39.0–52.0)
Hemoglobin: 11.7 g/dL — ABNORMAL LOW (ref 13.0–17.0)
Immature Granulocytes: 1 %
Lymphocytes Relative: 18 %
Lymphs Abs: 2.5 10*3/uL (ref 0.7–4.0)
MCH: 26.7 pg (ref 26.0–34.0)
MCHC: 30.8 g/dL (ref 30.0–36.0)
MCV: 86.6 fL (ref 80.0–100.0)
Monocytes Absolute: 0.6 10*3/uL (ref 0.1–1.0)
Monocytes Relative: 4 %
Neutro Abs: 10.5 10*3/uL — ABNORMAL HIGH (ref 1.7–7.7)
Neutrophils Relative %: 75 %
Platelets: 241 10*3/uL (ref 150–400)
RBC: 4.39 MIL/uL (ref 4.22–5.81)
RDW: 15.5 % (ref 11.5–15.5)
WBC: 13.9 10*3/uL — ABNORMAL HIGH (ref 4.0–10.5)
nRBC: 0 % (ref 0.0–0.2)

## 2020-10-24 LAB — URINE CULTURE: Culture: NO GROWTH

## 2020-10-24 LAB — BASIC METABOLIC PANEL
Anion gap: 15 (ref 5–15)
BUN: 29 mg/dL — ABNORMAL HIGH (ref 6–20)
CO2: 22 mmol/L (ref 22–32)
Calcium: 9.5 mg/dL (ref 8.9–10.3)
Chloride: 105 mmol/L (ref 98–111)
Creatinine, Ser: 1.8 mg/dL — ABNORMAL HIGH (ref 0.61–1.24)
GFR, Estimated: 44 mL/min — ABNORMAL LOW (ref >60)
Glucose, Bld: 164 mg/dL — ABNORMAL HIGH (ref 70–99)
Potassium: 4.4 mmol/L (ref 3.5–5.1)
Sodium: 142 mmol/L (ref 135–145)

## 2020-10-24 LAB — ECHOCARDIOGRAM COMPLETE
Area-P 1/2: 3.77 cm2
Height: 71 in
S' Lateral: 2.7 cm
Single Plane A2C EF: 53.4 %
Weight: 3280 oz

## 2020-10-24 LAB — HIV ANTIBODY (ROUTINE TESTING W REFLEX): HIV Screen 4th Generation wRfx: NONREACTIVE

## 2020-10-24 LAB — LACTIC ACID, PLASMA
Lactic Acid, Venous: 1.5 mmol/L (ref 0.5–1.9)
Lactic Acid, Venous: 2.1 mmol/L (ref 0.5–1.9)

## 2020-10-24 LAB — HEMOGLOBIN A1C
Hgb A1c MFr Bld: 7.4 % — ABNORMAL HIGH (ref 4.8–5.6)
Mean Plasma Glucose: 165.68 mg/dL

## 2020-10-24 MED ORDER — PERFLUTREN LIPID MICROSPHERE
1.0000 mL | INTRAVENOUS | Status: DC | PRN
  Administered 2020-10-24: 2 mL via INTRAVENOUS
  Filled 2020-10-24: qty 10

## 2020-10-24 MED ORDER — LACTATED RINGERS IV SOLN: INTRAVENOUS | Status: DC

## 2020-10-24 NOTE — Progress Notes (Signed)
PROGRESS NOTE   Paul Hanson  QMV:[id number]    DOB: 07-23-1966    DOA: 10/23/2020  PCP: Patient, No Pcp Per   I have briefly reviewed patients previous medical records in [facility].  Chief Complaint  Patient presents with  . Fever  . Hyperglycemia    Brief Narrative:  55 year old married male with medical history significant for DM, CVA, HTN, reportedly choked on a candy bar on night of 10/22/2020 and subsequently developed cough, fevers as high as 101 F, rigors and dyspnea.  Seen initially at [facility] ED, on presentation was hypotensive with SBP <90 and dyspneic.  Admitted for sepsis secondary to aspiration pneumonia.  Course complicated by acute kidney injury.   Assessment & Plan:  Active Problems:   Hypertension   Chronic diastolic heart failure (HCC)   Diabetes mellitus type 2 in nonobese ([place])   Sepsis ([place])   CAP (community acquired pneumonia)   Sepsis with acute organ dysfunction, POA due to aspiration pneumonia likely from choking on candy bar. Met sepsis criteria on admission including tachycardia, tachypnea, AKI, lactic acidosis and source.  Code sepsis initiated and treated with aggressive IV fluids and started on IV ceftriaxone and azithromycin.  Clinically improved.  Afebrile.  Not hypoxic.  Lactate improved.  Continue antibiotics and follow clinically.  Will need follow-up chest x-ray in 4 weeks to ensure resolution of pneumonia findings.  COVID-19 testing negative.  Blood cultures x2: Negative to date.  Repeat two-view chest x-ray suggest worsening of right basilar opacity.  Lactic acidosis: Secondary to sepsis.  Improved/2.1.  Continue IV fluids.  Acute kidney injury, likely due to sepsis related ATN Baseline creatinine 1.08 on 02/22/2018.  Presented with creatinine of 1.69 which has worsened to 1.8 today.  No history of nephrotoxic exposure except a dose of Motrin on night prior to admission.  IV fluids, avoid nephrotoxic's and follow daily  BMP.  Type II DM, uncontrolled A1c: 7.4 on 2/9.  Hold Jardiance and sulfonylureas.  Continue low-dose Lantus and SSI.  Chronic diastolic CHF 2D echo 2/9: LVEF 55-60%.  Clinically appears on the dry side.  Essential hypertension: Presented with hypotension, now resolved.  Blood pressures controlled.  Not on antihypertensives currently.  Body mass index is 28.59 kg/m.   DVT prophylaxis: enoxaparin (LOVENOX) injection 40 mg Start: 10/23/20 2200     Code Status: Full Code Family Communication: Discussed in detail with patient's spouse at bedside, updated care and answered questions. Disposition:  Status is: Inpatient  Remains inpatient appropriate because:Inpatient level of care appropriate due to severity of illness   Dispo: The patient is from: Home              Anticipated d/c is to: Home              Anticipated d/c date is: 2 days              Patient currently is not medically stable to d/c.   Difficult to place patient No        Consultants:   None  Procedures:   None  Antimicrobials:    Anti-infectives (From admission, onward)   Start     Dose/Rate Route Frequency Ordered Stop   10/23/20 0700  cefTRIAXone (ROCEPHIN) 2 g in sodium chloride 0.9 % 100 mL IVPB        2 g 200 mL/hr over 30 Minutes Intravenous Every 24 hours 10/23/20 0654     10/23/20 0700  azithromycin (ZITHROMAX) 500 mg  in sodium chloride 0.9 % 250 mL IVPB        500 mg 250 mL/hr over 60 Minutes Intravenous Every 24 hours 10/23/20 0654          Subjective:  Reports feeling better.  Cough has improved or even resolved.  No chest pain or dyspnea.  Urinating well.  No recurrence of fevers or chills  Objective:   Vitals:   10/24/20 0208 10/24/20 0627 10/24/20 1048 10/24/20 1246  BP: 114/70 118/75 111/74 103/76  Pulse:  (!) 105 98 (!) 106  Resp: (!) 22   20  Temp: 99.6 F (37.6 C) 98.7 F (37.1 C) 98.5 F (36.9 C) 98.4 F (36.9 C)  TempSrc: Oral Oral Oral Oral  SpO2: 96% 100% 98% 96%   Weight:      Height:        General exam: Pleasant young male, moderately built and overweight, lying comfortably supine in bed.  Does not look septic or toxic currently. Respiratory system: Diminished breath sounds in the bases with few crackles, right base > left.  Rest of lung fields clear to auscultation.  No wheezing or rhonchi.  No increased work of breathing. Cardiovascular system: S1 & S2 heard, RRR. No JVD, murmurs, rubs, gallops or clicks. No pedal edema.  Telemetry personally reviewed: SR-occasional mild ST in the 100s. Gastrointestinal system: Abdomen is nondistended, soft and nontender. No organomegaly or masses felt. Normal bowel sounds heard. Central nervous system: Alert and oriented. No focal neurological deficits. Extremities: Symmetric 5 x 5 power. Skin: No rashes, lesions or ulcers Psychiatry: Judgement and insight appear normal. Mood & affect appropriate.     Data Reviewed:   I have personally reviewed following labs and imaging studies   CBC: Recent Labs  Lab 10/23/20 0546 10/24/20 0608  WBC 7.9 13.9*  NEUTROABS 6.6 10.5*  HGB 12.5* 11.7*  HCT 38.9* 38.0*  MCV 83.5 86.6  PLT 265 782    Basic Metabolic Panel: Recent Labs  Lab 10/23/20 0546 10/24/20 0436  NA 136 142  K 4.1 4.4  CL 101 105  CO2 19* 22  GLUCOSE 286* 164*  BUN 28* 29*  CREATININE 1.69* 1.80*  CALCIUM 9.0 9.5    Liver Function Tests: Recent Labs  Lab 10/23/20 0546  AST 33  ALT 20  ALKPHOS 53  BILITOT 1.3*  PROT 7.3  ALBUMIN 4.0    CBG: Recent Labs  Lab 10/24/20 0741 10/24/20 1133 10/24/20 1623  GLUCAP 123* 110* 102*    Microbiology Studies:   Recent Results (from the past 240 hour(s))  Urine culture     Status: None   Collection Time: 10/23/20  5:46 AM   Specimen: In/Out Cath Urine  Result Value Ref Range Status   Specimen Description   Final    IN/OUT CATH URINE Performed at [facility], [address] [place], [place] [address]    Special  Requests   Final    NONE Performed at [facility], [address] [place], [place] [address]    Culture   Final    NO GROWTH Performed at [facility], [place] [address], [place], [place] [address]    Report Status 10/24/2020 FINAL  Final  SARS Coronavirus 2 by RT PCR (hospital order, performed in [facility] hospital lab) Nasopharyngeal Urine, Clean Catch     Status: None   Collection Time: 10/23/20  5:46 AM   Specimen: Urine, Clean Catch; Nasopharyngeal  Result Value  Ref Range Status   SARS Coronavirus 2 NEGATIVE NEGATIVE Final    Comment: (NOTE) SARS-CoV-2 target nucleic acids are NOT DETECTED.  The SARS-CoV-2 RNA is generally detectable in upper and lower respiratory specimens during the acute phase of infection. The lowest concentration of SARS-CoV-2 viral copies this assay can detect is 250 copies / mL. A negative result does not preclude SARS-CoV-2 infection and should not be used as the sole basis for treatment or other patient management decisions.  A negative result may occur with improper specimen collection / handling, submission of specimen other than nasopharyngeal swab, presence of viral mutation(s) within the areas targeted by this assay, and inadequate number of viral copies (<250 copies / mL). A negative result must be combined with clinical observations, patient history, and epidemiological information.  Fact Sheet for Patients:   StrictlyIdeas.no  Fact Sheet for Healthcare Providers: BankingDealers.co.za  This test is not yet approved or  cleared by the [place] FDA and has been authorized for detection and/or diagnosis of SARS-CoV-2 by FDA under an Emergency Use Authorization (EUA).  This EUA will remain in effect (meaning this test can be used) for the duration of the COVID-19 declaration under Section 564(b)(1) of the Act, 21 U.S.C. section 360bbb-3(b)(1), unless the authorization is  terminated or revoked sooner.  Performed at [facility], [address] [place], [place] [address]   Blood Culture (routine x 2)     Status: None (Preliminary result)   Collection Time: 10/23/20  5:50 AM   Specimen: BLOOD  Result Value Ref Range Status   Specimen Description BLOOD RIGHT ANTECUBITAL  Final   Special Requests   Final    BOTTLES DRAWN AEROBIC AND ANAEROBIC Blood Culture adequate volume   Culture   Final    NO GROWTH < 24 HOURS Performed at [facility], [place] [address] [place], [place] [address]    Report Status PENDING  Incomplete  Blood Culture (routine x 2)     Status: None (Preliminary result)   Collection Time: 10/23/20  6:00 AM   Specimen: BLOOD RIGHT HAND  Result Value Ref Range Status   Specimen Description BLOOD RIGHT HAND  Final   Special Requests   Final    BOTTLES DRAWN AEROBIC AND ANAEROBIC Blood Culture results may not be optimal due to an inadequate volume of blood received in culture bottles   Culture   Final    NO GROWTH < 24 HOURS Performed at [facility], [place] [address], [place], [place] [address]    Report Status PENDING  Incomplete     Radiology Studies:  DG Chest 2 View  Result Date: 10/24/2020 CLINICAL DATA:  Fever, shortness of breath. EXAM: CHEST - 2 VIEW COMPARISON:  October 23, 2020. FINDINGS: The heart size and mediastinal contours are within normal limits. No pneumothorax or pleural effusion is noted. Increased right basilar opacity is noted concerning for worsening pneumonia. Stable left basilar subsegmental atelectasis or infiltrate is noted. The visualized skeletal structures are unremarkable. IMPRESSION: Increased right basilar opacity is noted concerning for worsening pneumonia. Electronically Signed   By: Marijo Conception M.D.   On: 10/24/2020 16:44   DG Chest Port 1 View  Result Date: 10/23/2020 CLINICAL DATA:  55 year old male with possible sepsis.  Fever. EXAM: PORTABLE CHEST 1 VIEW COMPARISON:  Chest  radiographs 04/22/2015. FINDINGS: Portable AP semi upright view at 0526 hours. Improved, larger lung volumes since 2016. Normal cardiac size and mediastinal contours. Visualized  tracheal air column is within normal limits. No pneumothorax, pulmonary edema or pleural effusion. Vague asymmetric opacity at the left lateral lung base and about the right hilum. No consolidation. No acute osseous abnormality identified. Negative bowel gas pattern. IMPRESSION: Vague asymmetric opacity in both lungs is nonspecific but suspicious for early multifocal pneumonia in this setting. Electronically Signed   By: Genevie Ann M.D.   On: 10/23/2020 06:05   ECHOCARDIOGRAM COMPLETE  Result Date: 10/24/2020    ECHOCARDIOGRAM REPORT   Patient Name:   RANDEE UPCHURCH Date of Exam: 10/24/2020 Medical Rec #:  [id number]     Height:       71.0 in Accession #:    [id number]    Weight:       205.0 lb Date of Birth:  10-03-1965     BSA:          2.131 m Patient Age:    89 years      BP:           118/75 mmHg Patient Gender: M             HR:           96 bpm. Exam Location:  Inpatient Procedure: 2D Echo, Cardiac Doppler, Color Doppler and Intracardiac            Opacification Agent Indications:    Chronic diastolic heart failure ([place]) [428.32.ICD-9-CM]  History:        Patient has prior history of Echocardiogram examinations, most                 recent 04/23/2015. Stroke; Risk Factors:Hypertension, Diabetes and                 Non-Smoker.  Sonographer:    Vickie Epley RDCS Referring Phys: [place]  1. Left ventricular ejection fraction, by estimation, is 55 to 60%. The left ventricle has normal function. The left ventricle has no regional wall motion abnormalities. Left ventricular diastolic parameters were normal.  2. Right ventricular systolic function is normal. The right ventricular size is normal. There is mildly elevated pulmonary artery systolic pressure.  3. The mitral valve is normal in structure. No evidence of mitral  valve regurgitation. No evidence of mitral stenosis.  4. The aortic valve is tricuspid. Aortic valve regurgitation is not visualized. No aortic stenosis is present.  5. The inferior vena cava is normal in size with greater than 50% respiratory variability, suggesting right atrial pressure of 3 mmHg. FINDINGS  Left Ventricle: Left ventricular ejection fraction, by estimation, is 55 to 60%. The left ventricle has normal function. The left ventricle has no regional wall motion abnormalities. Definity contrast agent was given IV to delineate the left ventricular  endocardial borders. The left ventricular internal cavity size was normal in size. There is no left ventricular hypertrophy. Left ventricular diastolic parameters were normal. Right Ventricle: The right ventricular size is normal. No increase in right ventricular wall thickness. Right ventricular systolic function is normal. There is mildly elevated pulmonary artery systolic pressure. The tricuspid regurgitant velocity is 2.90  m/s, and with an assumed right atrial pressure of 3 mmHg, the estimated right ventricular systolic pressure is 35.5 mmHg. Left Atrium: Left atrial size was normal in size. Right Atrium: Right atrial size was normal in size. Pericardium: There is no evidence of pericardial effusion. Mitral Valve: The mitral valve is normal in structure. No evidence of mitral valve regurgitation. No evidence of mitral valve stenosis. Tricuspid  Valve: The tricuspid valve is normal in structure. Tricuspid valve regurgitation is mild . No evidence of tricuspid stenosis. Aortic Valve: The aortic valve is tricuspid. Aortic valve regurgitation is not visualized. No aortic stenosis is present. Pulmonic Valve: The pulmonic valve was normal in structure. Pulmonic valve regurgitation is not visualized. No evidence of pulmonic stenosis. Aorta: The aortic root is normal in size and structure. Venous: The inferior vena cava is normal in size with greater than 50%  respiratory variability, suggesting right atrial pressure of 3 mmHg. IAS/Shunts: The interatrial septum was not well visualized.  LEFT VENTRICLE PLAX 2D LVIDd:         4.20 cm     Diastology LVIDs:         2.70 cm     LV e' medial:    7.40 cm/s LV PW:         0.90 cm     LV E/e' medial:  7.9 LV IVS:        0.90 cm     LV e' lateral:   7.83 cm/s LVOT diam:     2.40 cm     LV E/e' lateral: 7.5 LV SV:         72 LV SV Index:   34 LVOT Area:     4.52 cm  LV Volumes (MOD) LV vol d, MOD A2C: 69.8 ml LV vol s, MOD A2C: 32.5 ml LV SV MOD A2C:     37.3 ml RIGHT VENTRICLE RV S prime:     17.50 cm/s TAPSE (M-mode): 2.0 cm LEFT ATRIUM             Index       RIGHT ATRIUM           Index LA diam:        3.40 cm 1.60 cm/m  RA Area:     12.90 cm LA Vol (A2C):   26.8 ml 12.58 ml/m RA Volume:   29.70 ml  13.94 ml/m LA Vol (A4C):   19.0 ml 8.92 ml/m LA Biplane Vol: 22.6 ml 10.61 ml/m  AORTIC VALVE LVOT Vmax:   86.20 cm/s LVOT Vmean:  58.500 cm/s LVOT VTI:    0.160 m  AORTA Ao Root diam: 3.40 cm MITRAL VALVE               TRICUSPID VALVE MV Area (PHT): 3.77 cm    TR Peak grad:   33.6 mmHg MV Decel Time: 201 msec    TR Vmax:        290.00 cm/s MV E velocity: 58.70 cm/s MV A velocity: 56.10 cm/s  SHUNTS MV E/A ratio:  1.05        Systemic VTI:  0.16 m                            Systemic Diam: 2.40 cm Jenkins Rouge MD Electronically signed by Jenkins Rouge MD Signature Date/Time: 10/24/2020/11:58:26 AM    Final      Scheduled Meds:   . atorvastatin  80 mg Oral Daily  . clopidogrel  75 mg Oral Daily  . enoxaparin (LOVENOX) injection  40 mg Subcutaneous Q24H  . insulin aspart  0-15 Units Subcutaneous TID WC  . insulin glargine  10 Units Subcutaneous QHS    Continuous Infusions:   . sodium chloride Stopped (10/23/20 1011)  . azithromycin 500 mg (10/24/20 1206)  . cefTRIAXone (ROCEPHIN)  IV 2 g (10/24/20 1039)  .  lactated ringers 100 mL/hr at 10/24/20 1354     LOS: 1 day     Vernell Leep, MD, [place], [facility]. [facility]    To contact the attending provider between 7A-7P or the covering provider during after hours 7P-7A, please log into the web site [email or web address] and access using universal [place] password for that web site. If you do not have the password, please call the hospital operator.  10/24/2020, 5:23 PM

## 2020-10-24 NOTE — Plan of Care (Signed)

## 2020-10-24 NOTE — Progress Notes (Signed)
Results for KIRTAN, SADA (MRN [id number]) as of 10/24/2020 12:36  Ref. Range 10/24/2020 12:04  Lactic Acid, Venous Latest Ref Range: 0.5 - 1.9 mmol/L 2.1 (HH)    Critical lab value received. Made MD Hongalgi aware. No new orders at this time.

## 2020-10-24 NOTE — Plan of Care (Signed)

## 2020-10-24 NOTE — Progress Notes (Signed)
  Echocardiogram 2D Echocardiogram has been performed.  Paul Hanson 10/24/2020, 11:52 AM

## 2020-10-25 LAB — BASIC METABOLIC PANEL
Anion gap: 11 (ref 5–15)
BUN: 25 mg/dL — ABNORMAL HIGH (ref 6–20)
CO2: 22 mmol/L (ref 22–32)
Calcium: 9.5 mg/dL (ref 8.9–10.3)
Chloride: 106 mmol/L (ref 98–111)
Creatinine, Ser: 1.35 mg/dL — ABNORMAL HIGH (ref 0.61–1.24)
GFR, Estimated: >60 mL/min (ref >60)
Glucose, Bld: 159 mg/dL — ABNORMAL HIGH (ref 70–99)
Potassium: 3.9 mmol/L (ref 3.5–5.1)
Sodium: 139 mmol/L (ref 135–145)

## 2020-10-25 LAB — CBC
HCT: 34.9 % — ABNORMAL LOW (ref 39.0–52.0)
Hemoglobin: 11.2 g/dL — ABNORMAL LOW (ref 13.0–17.0)
MCH: 27.1 pg (ref 26.0–34.0)
MCHC: 32.1 g/dL (ref 30.0–36.0)
MCV: 84.3 fL (ref 80.0–100.0)
Platelets: 222 10*3/uL (ref 150–400)
RBC: 4.14 MIL/uL — ABNORMAL LOW (ref 4.22–5.81)
RDW: 15.7 % — ABNORMAL HIGH (ref 11.5–15.5)
WBC: 10 10*3/uL (ref 4.0–10.5)
nRBC: 0 % (ref 0.0–0.2)

## 2020-10-25 LAB — GLUCOSE, CAPILLARY
Glucose-Capillary: 136 mg/dL — ABNORMAL HIGH (ref 70–99)
Glucose-Capillary: 175 mg/dL — ABNORMAL HIGH (ref 70–99)
Glucose-Capillary: 167 mg/dL — ABNORMAL HIGH (ref 70–99)
Glucose-Capillary: 176 mg/dL — ABNORMAL HIGH (ref 70–99)

## 2020-10-25 MED ORDER — LACTATED RINGERS IV SOLN: INTRAVENOUS | Status: AC

## 2020-10-25 MED ORDER — AZITHROMYCIN 250 MG PO TABS
500.0000 mg | ORAL_TABLET | Freq: Every day | ORAL | Status: DC
  Administered 2020-10-26: 500 mg via ORAL
  Filled 2020-10-25: qty 2

## 2020-10-25 MED ORDER — SENNOSIDES-DOCUSATE SODIUM 8.6-50 MG PO TABS
2.0000 | ORAL_TABLET | Freq: Two times a day (BID) | ORAL | Status: DC
  Administered 2020-10-25 – 2020-10-26 (×3): 2 via ORAL
  Filled 2020-10-25 (×3): qty 2

## 2020-10-25 NOTE — Progress Notes (Signed)
PROGRESS NOTE   Paul Hanson  KKX:[id number]    DOB: 05-08-1966    DOA: 10/23/2020  PCP: Patient, No Pcp Per   I have briefly reviewed patients previous medical records in [facility].  Chief Complaint  Patient presents with  . Fever  . Hyperglycemia    Brief Narrative:  55 year old married male with medical history significant for DM, CVA, HTN, reportedly choked on a candy bar on night of 10/22/2020 and subsequently developed cough, fevers as high as 101 F, rigors and dyspnea.  Seen initially at [facility] ED, on presentation was hypotensive with SBP <90 and dyspneic.  Admitted for sepsis secondary to aspiration pneumonia.  Course complicated by acute kidney injury.   Assessment & Plan:  Active Problems:   Hypertension   Chronic diastolic heart failure (HCC)   Diabetes mellitus type 2 in nonobese ([place])   Sepsis ([place])   CAP (community acquired pneumonia)   Septic shock ([place]>4), POA due to aspiration pneumonia likely from choking on candy bar. Met sepsis criteria on admission including tachycardia, tachypnea, AKI, lactic acidosis and source.  Code sepsis initiated and treated with aggressive IV fluids and started on IV ceftriaxone and azithromycin.  Clinically improved.  Afebrile.  Not hypoxic.  Lactate improved.  Continue antibiotics and follow clinically.  Will need follow-up chest x-ray in 4 weeks to ensure resolution of pneumonia findings.  COVID-19 testing negative.  Blood cultures x2: Negative to date.  Repeat two-view chest x-ray suggest worsening of right basilar opacity. Overall continues to improve.  Ambulate today.  Continue IV antibiotics for additional 24 hours given severity of initial presentation and then possible DC home tomorrow on oral antibiotics to complete course.  Lactic acidosis: Secondary to sepsis.  Improved/2.1.  Continue IV fluids at reduced rate for additional 24 hours for AKI.  Acute kidney injury, likely due to sepsis related  ATN Baseline creatinine 1.08 on 02/22/2018.  Presented with creatinine of 1.69 which has worsened to 1.8 on 2/9.  No history of nephrotoxic exposure except a dose of Motrin on night prior to admission.  IV fluids, avoid nephrotoxic's and follow daily BMP.  Creatinine down to 1.35, continue gentle IV fluids for additional 24 hours and follow BMP.  Type II DM, uncontrolled A1c: 7.4 on 2/9.  Hold Jardiance and sulfonylureas.  Continue low-dose Lantus and SSI.  Reasonable inpatient control.  Chronic diastolic CHF 2D echo 2/9: LVEF 55-60%.  Clinically appears euvolemic.  Essential hypertension: Presented with hypotension, now resolved.  Blood pressures controlled.  Not on antihypertensives currently.  Body mass index is 28.59 kg/m.   DVT prophylaxis: enoxaparin (LOVENOX) injection 40 mg Start: 10/23/20 2200     Code Status: Full Code Family Communication: Discussed in detail with patient's spouse at bedside, updated care and answered questions. Disposition:  Status is: Inpatient  Remains inpatient appropriate because:Inpatient level of care appropriate due to severity of illness   Dispo: The patient is from: Home              Anticipated d/c is to: Home              Anticipated d/c date is: 10/26/2020              Patient currently is not medically stable to d/c.   Difficult to place patient No        Consultants:   None  Procedures:   None  Antimicrobials:    Anti-infectives (From admission, onward)   Start  Dose/Rate Route Frequency Ordered Stop   10/26/20 1000  azithromycin (ZITHROMAX) tablet 500 mg        500 mg Oral Daily 10/25/20 1403     10/23/20 0700  cefTRIAXone (ROCEPHIN) 2 g in sodium chloride 0.9 % 100 mL IVPB        2 g 200 mL/hr over 30 Minutes Intravenous Every 24 hours 10/23/20 0654     10/23/20 0700  azithromycin (ZITHROMAX) 500 mg in sodium chloride 0.9 % 250 mL IVPB  Status:  Discontinued        500 mg 250 mL/hr over 60 Minutes Intravenous Every 24  hours 10/23/20 0654 10/25/20 1403        Subjective:  Continues to feel better.  Denies cough, dyspnea or chest pain.  Has ambulated a little in the room but not outside his room.  Objective:   Vitals:   10/24/20 1802 10/24/20 2007 10/25/20 0521 10/25/20 1152  BP: 105/71 104/76 100/73 97/78  Pulse: (!) 107 100 91 98  Resp: (!) $RemoveB'22 20  20  'CTgGmrpu$ Temp: 100.1 F (37.8 C) 98.1 F (36.7 C) 98.5 F (36.9 C) 98.1 F (36.7 C)  TempSrc: Oral Oral Oral Oral  SpO2: 96% 98% 95% 97%  Weight:      Height:        General exam: Pleasant young male, moderately built and overweight, lying comfortably supine in bed.  Does not look septic or toxic currently.  Appears in good spirits. Respiratory system: Occasional basal crackles but otherwise clear to auscultation. Cardiovascular system: S1 & S2 heard, RRR. No JVD, murmurs, rubs, gallops or clicks. No pedal edema.  Telemetry personally reviewed: Sinus rhythm. Gastrointestinal system: Abdomen is nondistended, soft and nontender. No organomegaly or masses felt. Normal bowel sounds heard. Central nervous system: Alert and oriented. No focal neurological deficits. Extremities: Symmetric 5 x 5 power. Skin: No rashes, lesions or ulcers Psychiatry: Judgement and insight appear normal. Mood & affect appropriate.     Data Reviewed:   I have personally reviewed following labs and imaging studies   CBC: Recent Labs  Lab 10/23/20 0546 10/24/20 0608 10/25/20 0433  WBC 7.9 13.9* 10.0  NEUTROABS 6.6 10.5*  --   HGB 12.5* 11.7* 11.2*  HCT 38.9* 38.0* 34.9*  MCV 83.5 86.6 84.3  PLT 265 241 559    Basic Metabolic Panel: Recent Labs  Lab 10/23/20 0546 10/24/20 0436 10/25/20 0433  NA 136 142 139  K 4.1 4.4 3.9  CL 101 105 106  CO2 19* 22 22  GLUCOSE 286* 164* 159*  BUN 28* 29* 25*  CREATININE 1.69* 1.80* 1.35*  CALCIUM 9.0 9.5 9.5    Liver Function Tests: Recent Labs  Lab 10/23/20 0546  AST 33  ALT 20  ALKPHOS 53  BILITOT 1.3*  PROT  7.3  ALBUMIN 4.0    CBG: Recent Labs  Lab 10/25/20 0728 10/25/20 1149 10/25/20 1717  GLUCAP 136* 175* 167*    Microbiology Studies:   Recent Results (from the past 240 hour(s))  Urine culture     Status: None   Collection Time: 10/23/20  5:46 AM   Specimen: In/Out Cath Urine  Result Value Ref Range Status   Specimen Description   Final    IN/OUT CATH URINE Performed at [facility], [address] [place], [place] [address]    Special Requests   Final    NONE Performed at [facility], [address] [place], [place] [address]  Culture   Final    NO GROWTH Performed at [facility], [place] [address], [place], [place] [address]    Report Status 10/24/2020 FINAL  Final  SARS Coronavirus 2 by RT PCR (hospital order, performed in [facility] hospital lab) Nasopharyngeal Urine, Clean Catch     Status: None   Collection Time: 10/23/20  5:46 AM   Specimen: Urine, Clean Catch; Nasopharyngeal  Result Value Ref Range Status   SARS Coronavirus 2 NEGATIVE NEGATIVE Final    Comment: (NOTE) SARS-CoV-2 target nucleic acids are NOT DETECTED.  The SARS-CoV-2 RNA is generally detectable in upper and lower respiratory specimens during the acute phase of infection. The lowest concentration of SARS-CoV-2 viral copies this assay can detect is 250 copies / mL. A negative result does not preclude SARS-CoV-2 infection and should not be used as the sole basis for treatment or other patient management decisions.  A negative result may occur with improper specimen collection / handling, submission of specimen other than nasopharyngeal swab, presence of viral mutation(s) within the areas targeted by this assay, and inadequate number of viral copies (<250 copies / mL). A negative result must be combined with clinical observations, patient history, and epidemiological information.  Fact Sheet for Patients:   StrictlyIdeas.no  Fact Sheet  for Healthcare Providers: BankingDealers.co.za  This test is not yet approved or  cleared by the [place] FDA and has been authorized for detection and/or diagnosis of SARS-CoV-2 by FDA under an Emergency Use Authorization (EUA).  This EUA will remain in effect (meaning this test can be used) for the duration of the COVID-19 declaration under Section 564(b)(1) of the Act, 21 U.S.C. section 360bbb-3(b)(1), unless the authorization is terminated or revoked sooner.  Performed at [facility], [address] [place], [place] [address]   Blood Culture (routine x 2)     Status: None (Preliminary result)   Collection Time: 10/23/20  5:50 AM   Specimen: BLOOD  Result Value Ref Range Status   Specimen Description BLOOD RIGHT ANTECUBITAL  Final   Special Requests   Final    BOTTLES DRAWN AEROBIC AND ANAEROBIC Blood Culture adequate volume   Culture   Final    NO GROWTH 2 DAYS Performed at [facility], [place] [address], [place], [place] [address]    Report Status PENDING  Incomplete  Blood Culture (routine x 2)     Status: None (Preliminary result)   Collection Time: 10/23/20  6:00 AM   Specimen: BLOOD RIGHT HAND  Result Value Ref Range Status   Specimen Description BLOOD RIGHT HAND  Final   Special Requests   Final    BOTTLES DRAWN AEROBIC AND ANAEROBIC Blood Culture results may not be optimal due to an inadequate volume of blood received in culture bottles   Culture   Final    NO GROWTH 2 DAYS Performed at [facility], [place] [address], [place], [place] [address]    Report Status PENDING  Incomplete     Radiology Studies:  DG Chest 2 View  Result Date: 10/24/2020 CLINICAL DATA:  Fever, shortness of breath. EXAM: CHEST - 2 VIEW COMPARISON:  October 23, 2020. FINDINGS: The heart size and mediastinal contours are within normal limits. No pneumothorax or pleural effusion is noted. Increased right basilar opacity is noted concerning for  worsening pneumonia. Stable left basilar subsegmental atelectasis or infiltrate is noted. The visualized skeletal structures are unremarkable. IMPRESSION: Increased right basilar opacity is noted  concerning for worsening pneumonia. Electronically Signed   By: Marijo Conception M.D.   On: 10/24/2020 16:44   ECHOCARDIOGRAM COMPLETE  Result Date: 10/24/2020    ECHOCARDIOGRAM REPORT   Patient Name:   Paul Hanson Date of Exam: 10/24/2020 Medical Rec #:  [id number]     Height:       71.0 in Accession #:    [id number]    Weight:       205.0 lb Date of Birth:  02-04-1966     BSA:          2.131 m Patient Age:    58 years      BP:           118/75 mmHg Patient Gender: M             HR:           96 bpm. Exam Location:  Inpatient Procedure: 2D Echo, Cardiac Doppler, Color Doppler and Intracardiac            Opacification Agent Indications:    Chronic diastolic heart failure ([place]) [428.32.ICD-9-CM]  History:        Patient has prior history of Echocardiogram examinations, most                 recent 04/23/2015. Stroke; Risk Factors:Hypertension, Diabetes and                 Non-Smoker.  Sonographer:    Vickie Epley RDCS Referring Phys: [place]  1. Left ventricular ejection fraction, by estimation, is 55 to 60%. The left ventricle has normal function. The left ventricle has no regional wall motion abnormalities. Left ventricular diastolic parameters were normal.  2. Right ventricular systolic function is normal. The right ventricular size is normal. There is mildly elevated pulmonary artery systolic pressure.  3. The mitral valve is normal in structure. No evidence of mitral valve regurgitation. No evidence of mitral stenosis.  4. The aortic valve is tricuspid. Aortic valve regurgitation is not visualized. No aortic stenosis is present.  5. The inferior vena cava is normal in size with greater than 50% respiratory variability, suggesting right atrial pressure of 3 mmHg. FINDINGS  Left Ventricle: Left  ventricular ejection fraction, by estimation, is 55 to 60%. The left ventricle has normal function. The left ventricle has no regional wall motion abnormalities. Definity contrast agent was given IV to delineate the left ventricular  endocardial borders. The left ventricular internal cavity size was normal in size. There is no left ventricular hypertrophy. Left ventricular diastolic parameters were normal. Right Ventricle: The right ventricular size is normal. No increase in right ventricular wall thickness. Right ventricular systolic function is normal. There is mildly elevated pulmonary artery systolic pressure. The tricuspid regurgitant velocity is 2.90  m/s, and with an assumed right atrial pressure of 3 mmHg, the estimated right ventricular systolic pressure is 85.6 mmHg. Left Atrium: Left atrial size was normal in size. Right Atrium: Right atrial size was normal in size. Pericardium: There is no evidence of pericardial effusion. Mitral Valve: The mitral valve is normal in structure. No evidence of mitral valve regurgitation. No evidence of mitral valve stenosis. Tricuspid Valve: The tricuspid valve is normal in structure. Tricuspid valve regurgitation is mild . No evidence of tricuspid stenosis. Aortic Valve: The aortic valve is tricuspid. Aortic valve regurgitation is not visualized. No aortic stenosis is present. Pulmonic Valve: The pulmonic valve was normal in structure. Pulmonic valve regurgitation is not visualized.  No evidence of pulmonic stenosis. Aorta: The aortic root is normal in size and structure. Venous: The inferior vena cava is normal in size with greater than 50% respiratory variability, suggesting right atrial pressure of 3 mmHg. IAS/Shunts: The interatrial septum was not well visualized.  LEFT VENTRICLE PLAX 2D LVIDd:         4.20 cm     Diastology LVIDs:         2.70 cm     LV e' medial:    7.40 cm/s LV PW:         0.90 cm     LV E/e' medial:  7.9 LV IVS:        0.90 cm     LV e' lateral:    7.83 cm/s LVOT diam:     2.40 cm     LV E/e' lateral: 7.5 LV SV:         72 LV SV Index:   34 LVOT Area:     4.52 cm  LV Volumes (MOD) LV vol d, MOD A2C: 69.8 ml LV vol s, MOD A2C: 32.5 ml LV SV MOD A2C:     37.3 ml RIGHT VENTRICLE RV S prime:     17.50 cm/s TAPSE (M-mode): 2.0 cm LEFT ATRIUM             Index       RIGHT ATRIUM           Index LA diam:        3.40 cm 1.60 cm/m  RA Area:     12.90 cm LA Vol (A2C):   26.8 ml 12.58 ml/m RA Volume:   29.70 ml  13.94 ml/m LA Vol (A4C):   19.0 ml 8.92 ml/m LA Biplane Vol: 22.6 ml 10.61 ml/m  AORTIC VALVE LVOT Vmax:   86.20 cm/s LVOT Vmean:  58.500 cm/s LVOT VTI:    0.160 m  AORTA Ao Root diam: 3.40 cm MITRAL VALVE               TRICUSPID VALVE MV Area (PHT): 3.77 cm    TR Peak grad:   33.6 mmHg MV Decel Time: 201 msec    TR Vmax:        290.00 cm/s MV E velocity: 58.70 cm/s MV A velocity: 56.10 cm/s  SHUNTS MV E/A ratio:  1.05        Systemic VTI:  0.16 m                            Systemic Diam: 2.40 cm Charlton Haws MD Electronically signed by Charlton Haws MD Signature Date/Time: 10/24/2020/11:58:26 AM    Final      Scheduled Meds:   . atorvastatin  80 mg Oral Daily  . [START ON 10/26/2020] azithromycin  500 mg Oral Daily  . clopidogrel  75 mg Oral Daily  . enoxaparin (LOVENOX) injection  40 mg Subcutaneous Q24H  . insulin aspart  0-15 Units Subcutaneous TID WC  . insulin glargine  10 Units Subcutaneous QHS  . senna-docusate  2 tablet Oral BID    Continuous Infusions:   . sodium chloride Stopped (10/23/20 1011)  . cefTRIAXone (ROCEPHIN)  IV 2 g (10/25/20 0818)  . lactated ringers 50 mL/hr at 10/25/20 0910     LOS: 2 days     Marcellus Scott, MD, [place], [facility]. [facility]    To contact the attending provider between 7A-7P or the covering provider  during after hours 7P-7A, please log into the web site [email or web address] and access using universal [place] password for that web site. If you do not have the password, please call the  hospital operator.  10/25/2020, 6:02 PM

## 2020-10-25 NOTE — Progress Notes (Signed)
PHARMACIST - PHYSICIAN COMMUNICATION DR:   Algis Liming CONCERNING: Antibiotic IV to Oral Route Change Policy  RECOMMENDATION: This patient is receiving azithromycin by the intravenous route.  Based on criteria approved by the Pharmacy and Therapeutics Committee, the antibiotic(s) is/are being converted to the equivalent oral dose form(s).   DESCRIPTION: These criteria include:  Patient being treated for a respiratory tract infection, urinary tract infection, cellulitis or clostridium difficile associated diarrhea if on metronidazole  The patient is not neutropenic and does not exhibit a GI malabsorption state  The patient is eating (either orally or via tube) and/or has been taking other orally administered medications for a least 24 hours  The patient is improving clinically and has a Tmax < 100.5  If you have questions about this conversion, please contact the [address], PharmD, [place]: [phone] 10/25/2020 2:05 PM

## 2020-10-26 ENCOUNTER — Other Ambulatory Visit (HOSPITAL_COMMUNITY): Payer: Self-pay | Admitting: Internal Medicine

## 2020-10-26 LAB — BASIC METABOLIC PANEL
Anion gap: 11 (ref 5–15)
BUN: 23 mg/dL — ABNORMAL HIGH (ref 6–20)
CO2: 21 mmol/L — ABNORMAL LOW (ref 22–32)
Calcium: 9.8 mg/dL (ref 8.9–10.3)
Chloride: 108 mmol/L (ref 98–111)
Creatinine, Ser: 1.18 mg/dL (ref 0.61–1.24)
GFR, Estimated: >60 mL/min (ref >60)
Glucose, Bld: 153 mg/dL — ABNORMAL HIGH (ref 70–99)
Potassium: 4.2 mmol/L (ref 3.5–5.1)
Sodium: 140 mmol/L (ref 135–145)

## 2020-10-26 LAB — GLUCOSE, CAPILLARY: Glucose-Capillary: 135 mg/dL — ABNORMAL HIGH (ref 70–99)

## 2020-10-26 MED ORDER — LISINOPRIL-HYDROCHLOROTHIAZIDE 20-12.5 MG PO TABS: 2.0000 | ORAL_TABLET | Freq: Every day | ORAL | Status: AC

## 2020-10-26 MED ORDER — SITAGLIP PHOS-METFORMIN HCL ER 50-1000 MG PO TB24: 1.0000 | ORAL_TABLET | Freq: Two times a day (BID) | ORAL | Status: AC

## 2020-10-26 MED ORDER — AMOXICILLIN-POT CLAVULANATE 875-125 MG PO TABS: 1.0000 | ORAL_TABLET | Freq: Two times a day (BID) | ORAL | 0 refills | Status: DC

## 2020-10-26 MED FILL — AMOX-CLAV 875-125 MG TABLET: 875-125 | 3 days supply | Qty: 6 | Fill #0

## 2020-10-26 NOTE — Discharge Instructions (Signed)

## 2020-10-26 NOTE — Progress Notes (Signed)
Patient discharged, discharge instructions given and explained to patient/wife they verbalized understanding, patient denies any pain/disress, accompanied home by wife. No pressure injury noted, skin intact.

## 2020-10-26 NOTE — Care Management Important Message (Signed)
Medicare IM printed for Paul Hanson to give to the patient.  

## 2020-10-26 NOTE — Discharge Summary (Signed)
Physician Discharge Summary  Paul Hanson FTD:[id number] DOB: Aug 18, 1966  PCP: Robert Bellow, PA-C  Admitted from: Home Discharged to: Home  Admit date: 10/23/2020 Discharge date: 10/26/2020  Recommendations for Outpatient Follow-up:    Follow-up Information    Robert Bellow, PA-C. Go on 10/29/2020.   Specialty: Internal Medicine Why: 1:30pm. To be seen with repeat labs (CBC & BMP). Contact information: [address] [place] [address] [phone]                Home Health: None    Equipment/Devices: None    Discharge Condition: Improved and stable   Code Status: Full Code Diet recommendation:  Discharge Diet Orders (From admission, onward)    Start     Ordered   10/26/20 0000  Diet - low sodium heart healthy        10/26/20 1018   10/26/20 0000  Diet Carb Modified        10/26/20 1018           Discharge Diagnoses:  Active Problems:   Hypertension   Chronic diastolic heart failure (HCC)   Diabetes mellitus type 2 in nonobese (HCC)   Sepsis ([place])   CAP (community acquired pneumonia)   Brief Summary: 55 year old married male with medical history significant for DM, CVA, HTN, reportedly choked on a candy bar on night of 10/22/2020 and subsequently developed cough, fevers as high as 101 F, rigors and dyspnea.  Seen initially at [facility] ED, on presentation was hypotensive with SBP <90 and dyspneic.  Admitted for sepsis secondary to aspiration pneumonia.  Course complicated by acute kidney injury.   Assessment & Plan:    Septic shock (Lacatate>4), POA due to aspiration pneumonia likely from choking on candy bar.  Met sepsis criteria on admission including tachycardia, tachypnea, AKI, lactic acidosis and source.  Code sepsis initiated and treated with aggressive IV fluids and started on IV ceftriaxone and azithromycin.  Clinically improved.  Afebrile.  Not hypoxic.  Lactate improved.  Continue antibiotics and follow  clinically.  Will need follow-up chest x-ray in 4 weeks to ensure resolution of pneumonia findings.  COVID-19 testing negative.  Blood cultures x2: Negative to date.  Repeat two-view chest x-ray suggest worsening of right basilar opacity.  Gradually improved.  Completed 4 days of IV ceftriaxone and azithromycin.  Asymptomatic, afebrile, not hypoxic even with activity.  Will discharge on Augmentin to complete total 7 days course.  Recommend repeating chest x-ray in 4 weeks to ensure resolution of pneumonia findings.  His aspiration pneumonia was felt to be due to a solitary event when he choked on a candy bar while he was laying down and eating.  No underlying dysphagia suspected.  Lactic acidosis:  Secondary to sepsis.    Resolved.  Acute kidney injury, likely due to sepsis related ATN  Baseline creatinine 1.08 on 02/22/2018.  Presented with creatinine of 1.69 which has worsened to 1.8 on 2/9.  No history of nephrotoxic exposure except a dose of Motrin on night prior to admission.  IV fluids, avoid nephrotoxic's and follow daily BMP.    Resolved after IV fluids.  Recommended holding HCTZ/ACEI and Metformin for additional 48 hours before resumption.  Follow BMP in a couple days as outpatient.  Type II DM, uncontrolled  A1c: 7.4 on 2/9.    Held oral meds on admission and treated with low-dose Lantus and SSI with reasonable inpatient control.  Resumed Jardiance and sulfonylurea but hold Metformin for  48 hours to allow for complete renal recovery.  Chronic diastolic CHF  2D echo 2/9: LVEF 55-60%.  Clinically appears euvolemic.  Essential hypertension:  Presented with hypotension, now resolved.    Blood pressures controlled off of antihypertensives while hospitalized.  At time of discharge, blood pressures controlled and even soft.  Recommend resuming antihypertensives in 48 hours from discharge.  Body mass index is 28.59 kg/m.      Consultants:   None  Procedures:    None   Discharge Instructions  Discharge Instructions    Call MD for:  difficulty breathing, headache or visual disturbances   Complete by: As directed    Call MD for:  extreme fatigue   Complete by: As directed    Call MD for:  persistant dizziness or light-headedness   Complete by: As directed    Call MD for:  persistant nausea and vomiting   Complete by: As directed    Call MD for:  temperature >100.4   Complete by: As directed    Diet - low sodium heart healthy   Complete by: As directed    Diet Carb Modified   Complete by: As directed    Increase activity slowly   Complete by: As directed        Medication List    TAKE these medications   amoxicillin-clavulanate 875-125 MG tablet Commonly known as: Augmentin Take 1 tablet by mouth 2 (two) times daily for 3 days.   Armodafinil 150 MG tablet Take 1 tablet by mouth See admin instructions. Takes 1 tablet by mouth on Tuesday, Wednesday and Thursday.   atorvastatin 80 MG tablet Commonly known as: LIPITOR TAKE 1 TABLET BY MOUTH DAILY.   cholecalciferol 25 MCG (1000 UNIT) tablet Commonly known as: VITAMIN D3 Take 1,000 Units by mouth daily.   clopidogrel 75 MG tablet Commonly known as: PLAVIX TAKE 1 TABLET (75 MG TOTAL) BY MOUTH DAILY What changed:   how much to take  how to take this  when to take this  additional instructions   cyanocobalamin 100 MCG tablet Take 100 mcg by mouth daily.   empagliflozin 10 MG Tabs tablet Commonly known as: JARDIANCE Take 10 mg by mouth daily.   glipiZIDE 5 MG tablet Commonly known as: GLUCOTROL Take 5 mg by mouth daily before breakfast.   glucose blood test strip Commonly known as: True Metrix Blood Glucose Test Use as instructed up to 4 times daily. IM program   lisinopril-hydrochlorothiazide 20-12.5 MG tablet Commonly known as: ZESTORETIC Take 2 tablets by mouth daily. Start taking on: October 29, 2020 What changed:   These instructions start on October 29, 2020. If you are unsure what to do until then, ask your doctor or other care provider.  Another medication with the same name was removed. Continue taking this medication, and follow the directions you see here.   SitaGLIPtin-MetFORMIN HCl 50-1000 MG Tb24 Take 1 tablet by mouth 2 (two) times daily with a meal. Start taking on: October 29, 2020 What changed: These instructions start on October 29, 2020. If you are unsure what to do until then, ask your doctor or other care provider.   SYSTANE BALANCE OP Place 1 drop into both eyes daily as needed (dry eyes).   terbinafine 1 % cream Commonly known as: LAMISIL Apply 1 application topically daily as needed (fungal infection).   True Metrix Meter w/Device Kit 1 Dose by Does not apply route 4 (four) times daily.      No Known  Allergies    Procedures/Studies: DG Chest 2 View  Result Date: 10/24/2020 CLINICAL DATA:  Fever, shortness of breath. EXAM: CHEST - 2 VIEW COMPARISON:  October 23, 2020. FINDINGS: The heart size and mediastinal contours are within normal limits. No pneumothorax or pleural effusion is noted. Increased right basilar opacity is noted concerning for worsening pneumonia. Stable left basilar subsegmental atelectasis or infiltrate is noted. The visualized skeletal structures are unremarkable. IMPRESSION: Increased right basilar opacity is noted concerning for worsening pneumonia. Electronically Signed   By: Marijo Conception M.D.   On: 10/24/2020 16:44   DG Chest Port 1 View  Result Date: 10/23/2020 CLINICAL DATA:  55 year old male with possible sepsis.  Fever. EXAM: PORTABLE CHEST 1 VIEW COMPARISON:  Chest radiographs 04/22/2015. FINDINGS: Portable AP semi upright view at 0526 hours. Improved, larger lung volumes since 2016. Normal cardiac size and mediastinal contours. Visualized tracheal air column is within normal limits. No pneumothorax, pulmonary edema or pleural effusion. Vague asymmetric opacity at the left lateral  lung base and about the right hilum. No consolidation. No acute osseous abnormality identified. Negative bowel gas pattern. IMPRESSION: Vague asymmetric opacity in both lungs is nonspecific but suspicious for early multifocal pneumonia in this setting. Electronically Signed   By: Genevie Ann M.D.   On: 10/23/2020 06:05   ECHOCARDIOGRAM COMPLETE  Result Date: 10/24/2020    ECHOCARDIOGRAM REPORT   Patient Name:   Paul Hanson Date of Exam: 10/24/2020 Medical Rec #:  [id number]     Height:       71.0 in Accession #:    [id number]    Weight:       205.0 lb Date of Birth:  [birth year]     BSA:          2.131 m Patient Age:    73 years      BP:           118/75 mmHg Patient Gender: M             HR:           96 bpm. Exam Location:  Inpatient Procedure: 2D Echo, Cardiac Doppler, Color Doppler and Intracardiac            Opacification Agent Indications:    Chronic diastolic heart failure ([place]) [428.32.ICD-9-CM]  History:        Patient has prior history of Echocardiogram examinations, most                 recent 04/23/2015. Stroke; Risk Factors:Hypertension, Diabetes and                 Non-Smoker.  Sonographer:    Vickie Epley RDCS Referring Phys: [place]  1. Left ventricular ejection fraction, by estimation, is 55 to 60%. The left ventricle has normal function. The left ventricle has no regional wall motion abnormalities. Left ventricular diastolic parameters were normal.  2. Right ventricular systolic function is normal. The right ventricular size is normal. There is mildly elevated pulmonary artery systolic pressure.  3. The mitral valve is normal in structure. No evidence of mitral valve regurgitation. No evidence of mitral stenosis.  4. The aortic valve is tricuspid. Aortic valve regurgitation is not visualized. No aortic stenosis is present.  5. The inferior vena cava is normal in size with greater than 50% respiratory variability, suggesting right atrial pressure of 3 mmHg. FINDINGS  Left  Ventricle: Left ventricular ejection fraction, by estimation, is 55 to 60%. The left  ventricle has normal function. The left ventricle has no regional wall motion abnormalities. Definity contrast agent was given IV to delineate the left ventricular  endocardial borders. The left ventricular internal cavity size was normal in size. There is no left ventricular hypertrophy. Left ventricular diastolic parameters were normal. Right Ventricle: The right ventricular size is normal. No increase in right ventricular wall thickness. Right ventricular systolic function is normal. There is mildly elevated pulmonary artery systolic pressure. The tricuspid regurgitant velocity is 2.90  m/s, and with an assumed right atrial pressure of 3 mmHg, the estimated right ventricular systolic pressure is 06.2 mmHg. Left Atrium: Left atrial size was normal in size. Right Atrium: Right atrial size was normal in size. Pericardium: There is no evidence of pericardial effusion. Mitral Valve: The mitral valve is normal in structure. No evidence of mitral valve regurgitation. No evidence of mitral valve stenosis. Tricuspid Valve: The tricuspid valve is normal in structure. Tricuspid valve regurgitation is mild . No evidence of tricuspid stenosis. Aortic Valve: The aortic valve is tricuspid. Aortic valve regurgitation is not visualized. No aortic stenosis is present. Pulmonic Valve: The pulmonic valve was normal in structure. Pulmonic valve regurgitation is not visualized. No evidence of pulmonic stenosis. Aorta: The aortic root is normal in size and structure. Venous: The inferior vena cava is normal in size with greater than 50% respiratory variability, suggesting right atrial pressure of 3 mmHg. IAS/Shunts: The interatrial septum was not well visualized.  LEFT VENTRICLE PLAX 2D LVIDd:         4.20 cm     Diastology LVIDs:         2.70 cm     LV e' medial:    7.40 cm/s LV PW:         0.90 cm     LV E/e' medial:  7.9 LV IVS:        0.90 cm      LV e' lateral:   7.83 cm/s LVOT diam:     2.40 cm     LV E/e' lateral: 7.5 LV SV:         72 LV SV Index:   34 LVOT Area:     4.52 cm  LV Volumes (MOD) LV vol d, MOD A2C: 69.8 ml LV vol s, MOD A2C: 32.5 ml LV SV MOD A2C:     37.3 ml RIGHT VENTRICLE RV S prime:     17.50 cm/s TAPSE (M-mode): 2.0 cm LEFT ATRIUM             Index       RIGHT ATRIUM           Index LA diam:        3.40 cm 1.60 cm/m  RA Area:     12.90 cm LA Vol (A2C):   26.8 ml 12.58 ml/m RA Volume:   29.70 ml  13.94 ml/m LA Vol (A4C):   19.0 ml 8.92 ml/m LA Biplane Vol: 22.6 ml 10.61 ml/m  AORTIC VALVE LVOT Vmax:   86.20 cm/s LVOT Vmean:  58.500 cm/s LVOT VTI:    0.160 m  AORTA Ao Root diam: 3.40 cm MITRAL VALVE               TRICUSPID VALVE MV Area (PHT): 3.77 cm    TR Peak grad:   33.6 mmHg MV Decel Time: 201 msec    TR Vmax:        290.00 cm/s MV E velocity: 58.70 cm/s MV A velocity:  56.10 cm/s  SHUNTS MV E/A ratio:  1.05        Systemic VTI:  0.16 m                            Systemic Diam: 2.40 cm Jenkins Rouge MD Electronically signed by Jenkins Rouge MD Signature Date/Time: 10/24/2020/11:58:26 AM    Final       Subjective: Feels well.  Denies complaints.  Anxious to go home.  No cough, dyspnea, chest pain.  Reports ambulating comfortably in the room and in the hall without symptoms.  Had a BM since yesterday.  Tolerating diet well.  Discharge Exam:  Vitals:   10/25/20 0521 10/25/20 1152 10/25/20 2123 10/26/20 0525  BP: 100/73 97/78 107/74 107/80  Pulse: 91 98 87 77  Resp:  $Remo'20 18 20  'osIez$ Temp: 98.5 F (36.9 C) 98.1 F (36.7 C) 97.9 F (36.6 C) 97.7 F (36.5 C)  TempSrc: Oral Oral Oral Oral  SpO2: 95% 97% 98% 99%  Weight:      Height:        General exam: Pleasant young male, moderately built and overweight, lying comfortably supine in bed.  Does not look septic or toxic currently.  Appears in good spirits. Respiratory system:  Clear to auscultation.  No increased work of breathing. Cardiovascular system: S1 & S2 heard,  RRR. No JVD, murmurs, rubs, gallops or clicks. No pedal edema.   Gastrointestinal system: Abdomen is nondistended, soft and nontender. No organomegaly or masses felt. Normal bowel sounds heard. Central nervous system: Alert and oriented. No focal neurological deficits. Extremities: Symmetric 5 x 5 power. Skin: No rashes, lesions or ulcers Psychiatry: Judgement and insight appear normal. Mood & affect appropriate.     The results of significant diagnostics from this hospitalization (including imaging, microbiology, ancillary and laboratory) are listed below for reference.     Microbiology: Recent Results (from the past 240 hour(s))  Urine culture     Status: None   Collection Time: 10/23/20  5:46 AM   Specimen: In/Out Cath Urine  Result Value Ref Range Status   Specimen Description   Final    IN/OUT CATH URINE Performed at [facility], [address] [place], [place] [address]    Special Requests   Final    NONE Performed at [facility], [address] [place], [place] [address]    Culture   Final    NO GROWTH Performed at [facility], [place] [address], [place], [place] [address]    Report Status 10/24/2020 FINAL  Final  SARS Coronavirus 2 by RT PCR (hospital order, performed in [facility] hospital lab) Nasopharyngeal Urine, Clean Catch     Status: None   Collection Time: 10/23/20  5:46 AM   Specimen: Urine, Clean Catch; Nasopharyngeal  Result Value Ref Range Status   SARS Coronavirus 2 NEGATIVE NEGATIVE Final    Comment: (NOTE) SARS-CoV-2 target nucleic acids are NOT DETECTED.  The SARS-CoV-2 RNA is generally detectable in upper and lower respiratory specimens during the acute phase of infection. The lowest concentration of SARS-CoV-2 viral copies this assay can detect is 250 copies / mL. A negative result does not preclude SARS-CoV-2 infection and should not be used as the sole basis for treatment or other patient management decisions.  A  negative result may occur with improper specimen collection / handling, submission of specimen other than nasopharyngeal swab, presence of viral  mutation(s) within the areas targeted by this assay, and inadequate number of viral copies (<250 copies / mL). A negative result must be combined with clinical observations, patient history, and epidemiological information.  Fact Sheet for Patients:   StrictlyIdeas.no  Fact Sheet for Healthcare Providers: BankingDealers.co.za  This test is not yet approved or  cleared by the [place] FDA and has been authorized for detection and/or diagnosis of SARS-CoV-2 by FDA under an Emergency Use Authorization (EUA).  This EUA will remain in effect (meaning this test can be used) for the duration of the COVID-19 declaration under Section 564(b)(1) of the Act, 21 U.S.C. section 360bbb-3(b)(1), unless the authorization is terminated or revoked sooner.  Performed at [facility], [address] [place], [place] [address]   Blood Culture (routine x 2)     Status: None (Preliminary result)   Collection Time: 10/23/20  5:50 AM   Specimen: BLOOD  Result Value Ref Range Status   Specimen Description BLOOD RIGHT ANTECUBITAL  Final   Special Requests   Final    BOTTLES DRAWN AEROBIC AND ANAEROBIC Blood Culture adequate volume   Culture   Final    NO GROWTH 3 DAYS Performed at [facility], [place] [address], [place], [place] [address]    Report Status PENDING  Incomplete  Blood Culture (routine x 2)     Status: None (Preliminary result)   Collection Time: 10/23/20  6:00 AM   Specimen: BLOOD RIGHT HAND  Result Value Ref Range Status   Specimen Description BLOOD RIGHT HAND  Final   Special Requests   Final    BOTTLES DRAWN AEROBIC AND ANAEROBIC Blood Culture results may not be optimal due to an inadequate volume of blood received in culture bottles   Culture   Final    NO GROWTH 3  DAYS Performed at [facility], [place] [address], [place],  [address]    Report Status PENDING  Incomplete     Labs: CBC: Recent Labs  Lab 10/23/20 0546 10/24/20 0608 10/25/20 0433  WBC 7.9 13.9* 10.0  NEUTROABS 6.6 10.5*  --   HGB 12.5* 11.7* 11.2*  HCT 38.9* 38.0* 34.9*  MCV 83.5 86.6 84.3  PLT 265 241 010    Basic Metabolic Panel: Recent Labs  Lab 10/23/20 0546 10/24/20 0436 10/25/20 0433 10/26/20 0426  NA 136 142 139 140  K 4.1 4.4 3.9 4.2  CL 101 105 106 108  CO2 19* 22 22 21*  GLUCOSE 286* 164* 159* 153*  BUN 28* 29* 25* 23*  CREATININE 1.69* 1.80* 1.35* 1.18  CALCIUM 9.0 9.5 9.5 9.8    Liver Function Tests: Recent Labs  Lab 10/23/20 0546  AST 33  ALT 20  ALKPHOS 53  BILITOT 1.3*  PROT 7.3  ALBUMIN 4.0    CBG: Recent Labs  Lab 10/25/20 0728 10/25/20 1149 10/25/20 1717 10/25/20 2119 10/26/20 0733  GLUCAP 136* 175* 167* 176* 135*    Hgb A1c Recent Labs    10/24/20 0608  HGBA1C 7.4*    Urinalysis    Component Value Date/Time   COLORURINE YELLOW 10/23/2020 0546   APPEARANCEUR HAZY (A) 10/23/2020 0546   LABSPEC 1.010 10/23/2020 0546   PHURINE 5.0 10/23/2020 0546   GLUCOSEU >=500 (A) 10/23/2020 0546   HGBUR NEGATIVE 10/23/2020 0546   BILIRUBINUR NEGATIVE 10/23/2020 [place] 10/23/2020 0546   PROTEINUR NEGATIVE 10/23/2020 0546   UROBILINOGEN 1.0 04/22/2015 1729   NITRITE NEGATIVE 10/23/2020  [place] [facility] 10/23/2020 0546   I discussed in detail with patient spouse at bedside, updated care and answered all questions.   Time coordinating discharge: 25 minutes  SIGNED:  Vernell Leep, MD, [place], [facility]  To contact the attending provider between 7A-7P or the covering provider during after hours 7P-7A, please log into the web site [email or web address] and access using universal [place] password for that web site. If you do not have the password, please call the hospital  operator.

## 2020-10-28 LAB — CULTURE, BLOOD (ROUTINE X 2)
Culture: NO GROWTH
Culture: NO GROWTH
Special Requests: ADEQUATE

## 2020-10-29 ENCOUNTER — Other Ambulatory Visit (HOSPITAL_BASED_OUTPATIENT_CLINIC_OR_DEPARTMENT_OTHER): Payer: Self-pay | Admitting: Physician Assistant

## 2020-10-29 MED FILL — JARDIANCE 25 MG TABLET: 25 | 30 days supply | Qty: 30 | Fill #9

## 2020-10-29 MED FILL — LISINOPRIL-HCTZ 20-12.5 MG: 20-12.5 | 30 days supply | Qty: 60 | Fill #2

## 2020-10-29 MED FILL — BENZONATATE 200 MG CAPS: 200 | 7 days supply | Qty: 20 | Fill #0

## 2020-10-29 MED FILL — CLOPIDOGREL 75 MG TABLET: 75 | 30 days supply | Qty: 30 | Fill #3

## 2020-10-29 MED FILL — glipiZIDE ER 5 MG TB24: 5 | 30 days supply | Qty: 30 | Fill #1

## 2020-10-31 ENCOUNTER — Other Ambulatory Visit (HOSPITAL_BASED_OUTPATIENT_CLINIC_OR_DEPARTMENT_OTHER): Payer: Self-pay | Admitting: Physician Assistant

## 2020-10-31 MED FILL — TAMSULOSIN HCL 0.4 MG CAP: 0.4 | 30 days supply | Qty: 30 | Fill #0

## 2020-11-29 ENCOUNTER — Other Ambulatory Visit (HOSPITAL_BASED_OUTPATIENT_CLINIC_OR_DEPARTMENT_OTHER): Payer: Self-pay | Admitting: Physician Assistant

## 2020-12-15 ENCOUNTER — Other Ambulatory Visit (HOSPITAL_BASED_OUTPATIENT_CLINIC_OR_DEPARTMENT_OTHER): Payer: Self-pay

## 2020-12-21 ENCOUNTER — Other Ambulatory Visit (HOSPITAL_BASED_OUTPATIENT_CLINIC_OR_DEPARTMENT_OTHER): Payer: Self-pay

## 2020-12-21 MED FILL — Glipizide Tab ER 24HR 5 MG: ORAL | 30 days supply | Qty: 30 | Fill #0 | Status: CN

## 2020-12-26 ENCOUNTER — Other Ambulatory Visit (HOSPITAL_BASED_OUTPATIENT_CLINIC_OR_DEPARTMENT_OTHER): Payer: Self-pay

## 2020-12-26 MED ORDER — LISINOPRIL-HYDROCHLOROTHIAZIDE 20-12.5 MG PO TABS
1.0000 | ORAL_TABLET | Freq: Two times a day (BID) | ORAL | 3 refills | Status: DC
  Filled 2020-12-26 – 2020-12-31 (×2): qty 60, 30d supply, fill #0
  Filled 2021-01-30: qty 60, 30d supply, fill #1
  Filled 2021-02-18 – 2021-03-13 (×3): qty 60, 30d supply, fill #2
  Filled 2021-04-12: qty 60, 30d supply, fill #3

## 2020-12-26 MED ORDER — TAMSULOSIN HCL 0.4 MG PO CAPS
0.4000 mg | ORAL_CAPSULE | Freq: Every day | ORAL | 3 refills | Status: AC
  Filled 2020-12-26 – 2020-12-31 (×2): qty 90, 90d supply, fill #0
  Filled 2021-04-01: qty 90, 90d supply, fill #1
  Filled 2021-07-19: qty 90, 90d supply, fill #2
  Filled 2021-10-13: qty 90, 90d supply, fill #3

## 2020-12-26 MED ORDER — JANUMET XR 50-1000 MG PO TB24
1.0000 | ORAL_TABLET | Freq: Two times a day (BID) | ORAL | 11 refills | Status: DC
  Filled 2020-12-26 – 2021-05-01 (×3): qty 60, 30d supply, fill #0
  Filled 2021-05-30: qty 60, 30d supply, fill #1
  Filled 2021-06-26: qty 60, 30d supply, fill #2
  Filled 2021-08-02: qty 60, 30d supply, fill #3
  Filled 2021-08-29: qty 60, 30d supply, fill #4
  Filled 2021-09-27: qty 60, 30d supply, fill #5
  Filled 2021-10-24: qty 60, 30d supply, fill #6
  Filled 2021-11-26: qty 60, 30d supply, fill #7

## 2020-12-26 MED ORDER — ATORVASTATIN CALCIUM 80 MG PO TABS
1.0000 | ORAL_TABLET | Freq: Every day | ORAL | 3 refills | Status: AC
  Filled 2020-12-26: qty 90, 90d supply, fill #0

## 2020-12-26 MED ORDER — JARDIANCE 25 MG PO TABS
25.0000 mg | ORAL_TABLET | Freq: Every day | ORAL | 3 refills | Status: DC
  Filled 2020-12-26 – 2020-12-31 (×2): qty 90, 90d supply, fill #0
  Filled 2021-01-11: qty 30, 30d supply, fill #0
  Filled 2021-01-11: qty 90, 90d supply, fill #0
  Filled 2021-02-18: qty 30, 30d supply, fill #1
  Filled 2021-03-13: qty 30, 30d supply, fill #2
  Filled 2021-04-12: qty 30, 30d supply, fill #3
  Filled 2021-05-21: qty 30, 30d supply, fill #4
  Filled 2021-06-26: qty 30, 30d supply, fill #5
  Filled 2021-07-29: qty 30, 30d supply, fill #6
  Filled 2021-08-29: qty 30, 30d supply, fill #7
  Filled 2021-09-27: qty 30, 30d supply, fill #8
  Filled 2021-10-24: qty 30, 30d supply, fill #9
  Filled 2021-11-26: qty 30, 30d supply, fill #10

## 2020-12-26 MED ORDER — CLOPIDOGREL BISULFATE 75 MG PO TABS
1.0000 | ORAL_TABLET | Freq: Every day | ORAL | 3 refills | Status: DC
  Filled 2020-12-26 – 2021-01-11 (×3): qty 90, 90d supply, fill #0
  Filled 2021-04-12: qty 90, 90d supply, fill #1
  Filled 2021-07-12: qty 90, 90d supply, fill #2
  Filled 2021-10-13: qty 90, 90d supply, fill #3

## 2020-12-26 MED ORDER — GLIPIZIDE ER 5 MG PO TB24
5.0000 mg | ORAL_TABLET | Freq: Every day | ORAL | 3 refills | Status: AC
  Filled 2020-12-26: qty 90, 90d supply, fill #0

## 2020-12-26 MED ORDER — SILDENAFIL CITRATE 25 MG PO TABS
ORAL_TABLET | ORAL | 2 refills | Status: AC
  Filled 2020-12-26: qty 30, 30d supply, fill #0

## 2020-12-26 MED ORDER — CHOLECALCIFEROL 25 MCG (1000 UT) PO TABS
ORAL_TABLET | ORAL | 3 refills | Status: AC
  Filled 2020-12-26: qty 180, 90d supply, fill #0

## 2020-12-27 ENCOUNTER — Other Ambulatory Visit (HOSPITAL_BASED_OUTPATIENT_CLINIC_OR_DEPARTMENT_OTHER): Payer: Self-pay

## 2020-12-28 ENCOUNTER — Other Ambulatory Visit (HOSPITAL_BASED_OUTPATIENT_CLINIC_OR_DEPARTMENT_OTHER): Payer: Self-pay

## 2020-12-31 ENCOUNTER — Other Ambulatory Visit (HOSPITAL_BASED_OUTPATIENT_CLINIC_OR_DEPARTMENT_OTHER): Payer: Self-pay

## 2020-12-31 MED FILL — Glipizide Tab ER 24HR 5 MG: ORAL | 30 days supply | Qty: 30 | Fill #0 | Status: AC

## 2021-01-07 ENCOUNTER — Other Ambulatory Visit (HOSPITAL_BASED_OUTPATIENT_CLINIC_OR_DEPARTMENT_OTHER): Payer: Self-pay

## 2021-01-11 ENCOUNTER — Other Ambulatory Visit (HOSPITAL_BASED_OUTPATIENT_CLINIC_OR_DEPARTMENT_OTHER): Payer: Self-pay

## 2021-01-11 MED FILL — Sitagliptin-Metformin HCl Tab ER 24HR 50-1000 MG: ORAL | 30 days supply | Qty: 60 | Fill #0 | Status: AC

## 2021-01-30 ENCOUNTER — Other Ambulatory Visit (HOSPITAL_BASED_OUTPATIENT_CLINIC_OR_DEPARTMENT_OTHER): Payer: Self-pay

## 2021-01-30 MED FILL — Glipizide Tab ER 24HR 5 MG: ORAL | 30 days supply | Qty: 30 | Fill #0 | Status: AC

## 2021-02-18 ENCOUNTER — Other Ambulatory Visit (HOSPITAL_BASED_OUTPATIENT_CLINIC_OR_DEPARTMENT_OTHER): Payer: Self-pay

## 2021-02-18 MED FILL — Glipizide Tab ER 24HR 5 MG: ORAL | 30 days supply | Qty: 30 | Fill #1 | Status: CN

## 2021-02-18 MED FILL — Sitagliptin-Metformin HCl Tab ER 24HR 50-1000 MG: ORAL | 30 days supply | Qty: 60 | Fill #1 | Status: AC

## 2021-02-18 MED FILL — Atorvastatin Calcium Tab 80 MG (Base Equivalent): ORAL | 90 days supply | Qty: 90 | Fill #0 | Status: AC

## 2021-02-22 ENCOUNTER — Other Ambulatory Visit (HOSPITAL_BASED_OUTPATIENT_CLINIC_OR_DEPARTMENT_OTHER): Payer: Self-pay

## 2021-02-22 MED FILL — Glipizide Tab ER 24HR 5 MG: ORAL | 30 days supply | Qty: 30 | Fill #1 | Status: CN

## 2021-02-28 ENCOUNTER — Other Ambulatory Visit (HOSPITAL_BASED_OUTPATIENT_CLINIC_OR_DEPARTMENT_OTHER): Payer: Self-pay

## 2021-03-01 ENCOUNTER — Other Ambulatory Visit (HOSPITAL_BASED_OUTPATIENT_CLINIC_OR_DEPARTMENT_OTHER): Payer: Self-pay

## 2021-03-04 ENCOUNTER — Other Ambulatory Visit (HOSPITAL_BASED_OUTPATIENT_CLINIC_OR_DEPARTMENT_OTHER): Payer: Self-pay

## 2021-03-04 MED ORDER — GLIPIZIDE ER 5 MG PO TB24
5.0000 mg | ORAL_TABLET | Freq: Every day | ORAL | 3 refills | Status: DC
  Filled 2021-03-04: qty 90, 90d supply, fill #0
  Filled 2021-05-30: qty 90, 90d supply, fill #1
  Filled 2021-08-29: qty 90, 90d supply, fill #2
  Filled 2021-11-26: qty 90, 90d supply, fill #3

## 2021-03-05 ENCOUNTER — Other Ambulatory Visit (HOSPITAL_BASED_OUTPATIENT_CLINIC_OR_DEPARTMENT_OTHER): Payer: Self-pay

## 2021-03-13 ENCOUNTER — Other Ambulatory Visit (HOSPITAL_BASED_OUTPATIENT_CLINIC_OR_DEPARTMENT_OTHER): Payer: Self-pay

## 2021-03-20 ENCOUNTER — Other Ambulatory Visit (HOSPITAL_BASED_OUTPATIENT_CLINIC_OR_DEPARTMENT_OTHER): Payer: Self-pay

## 2021-03-20 MED ORDER — ARMODAFINIL 150 MG PO TABS
150.0000 mg | ORAL_TABLET | Freq: Every day | ORAL | 0 refills | Status: AC
  Filled 2021-03-20 (×2): qty 30, 30d supply, fill #0

## 2021-03-21 ENCOUNTER — Other Ambulatory Visit (HOSPITAL_BASED_OUTPATIENT_CLINIC_OR_DEPARTMENT_OTHER): Payer: Self-pay

## 2021-03-22 ENCOUNTER — Other Ambulatory Visit (HOSPITAL_BASED_OUTPATIENT_CLINIC_OR_DEPARTMENT_OTHER): Payer: Self-pay

## 2021-03-25 ENCOUNTER — Other Ambulatory Visit (HOSPITAL_BASED_OUTPATIENT_CLINIC_OR_DEPARTMENT_OTHER): Payer: Self-pay

## 2021-03-26 ENCOUNTER — Other Ambulatory Visit (HOSPITAL_BASED_OUTPATIENT_CLINIC_OR_DEPARTMENT_OTHER): Payer: Self-pay

## 2021-03-27 ENCOUNTER — Other Ambulatory Visit (HOSPITAL_BASED_OUTPATIENT_CLINIC_OR_DEPARTMENT_OTHER): Payer: Self-pay

## 2021-03-28 ENCOUNTER — Other Ambulatory Visit (HOSPITAL_BASED_OUTPATIENT_CLINIC_OR_DEPARTMENT_OTHER): Payer: Self-pay

## 2021-03-29 ENCOUNTER — Other Ambulatory Visit (HOSPITAL_BASED_OUTPATIENT_CLINIC_OR_DEPARTMENT_OTHER): Payer: Self-pay

## 2021-04-01 ENCOUNTER — Other Ambulatory Visit (HOSPITAL_BASED_OUTPATIENT_CLINIC_OR_DEPARTMENT_OTHER): Payer: Self-pay

## 2021-04-01 MED FILL — Sitagliptin-Metformin HCl Tab ER 24HR 50-1000 MG: ORAL | 30 days supply | Qty: 60 | Fill #2 | Status: AC

## 2021-04-02 ENCOUNTER — Other Ambulatory Visit (HOSPITAL_BASED_OUTPATIENT_CLINIC_OR_DEPARTMENT_OTHER): Payer: Self-pay

## 2021-04-05 ENCOUNTER — Other Ambulatory Visit (HOSPITAL_BASED_OUTPATIENT_CLINIC_OR_DEPARTMENT_OTHER): Payer: Self-pay

## 2021-04-08 ENCOUNTER — Other Ambulatory Visit (HOSPITAL_BASED_OUTPATIENT_CLINIC_OR_DEPARTMENT_OTHER): Payer: Self-pay

## 2021-04-09 ENCOUNTER — Other Ambulatory Visit (HOSPITAL_BASED_OUTPATIENT_CLINIC_OR_DEPARTMENT_OTHER): Payer: Self-pay

## 2021-04-12 ENCOUNTER — Other Ambulatory Visit (HOSPITAL_BASED_OUTPATIENT_CLINIC_OR_DEPARTMENT_OTHER): Payer: Self-pay

## 2021-04-15 ENCOUNTER — Other Ambulatory Visit (HOSPITAL_BASED_OUTPATIENT_CLINIC_OR_DEPARTMENT_OTHER): Payer: Self-pay

## 2021-04-19 IMAGING — DX DG CHEST 2V
2 series · 2 of 2 positions shown · non-contrast
Comparison: October 23, 2020.

CLINICAL DATA: Fever, shortness of breath.

EXAM:
CHEST - 2 VIEW

[chest pa]
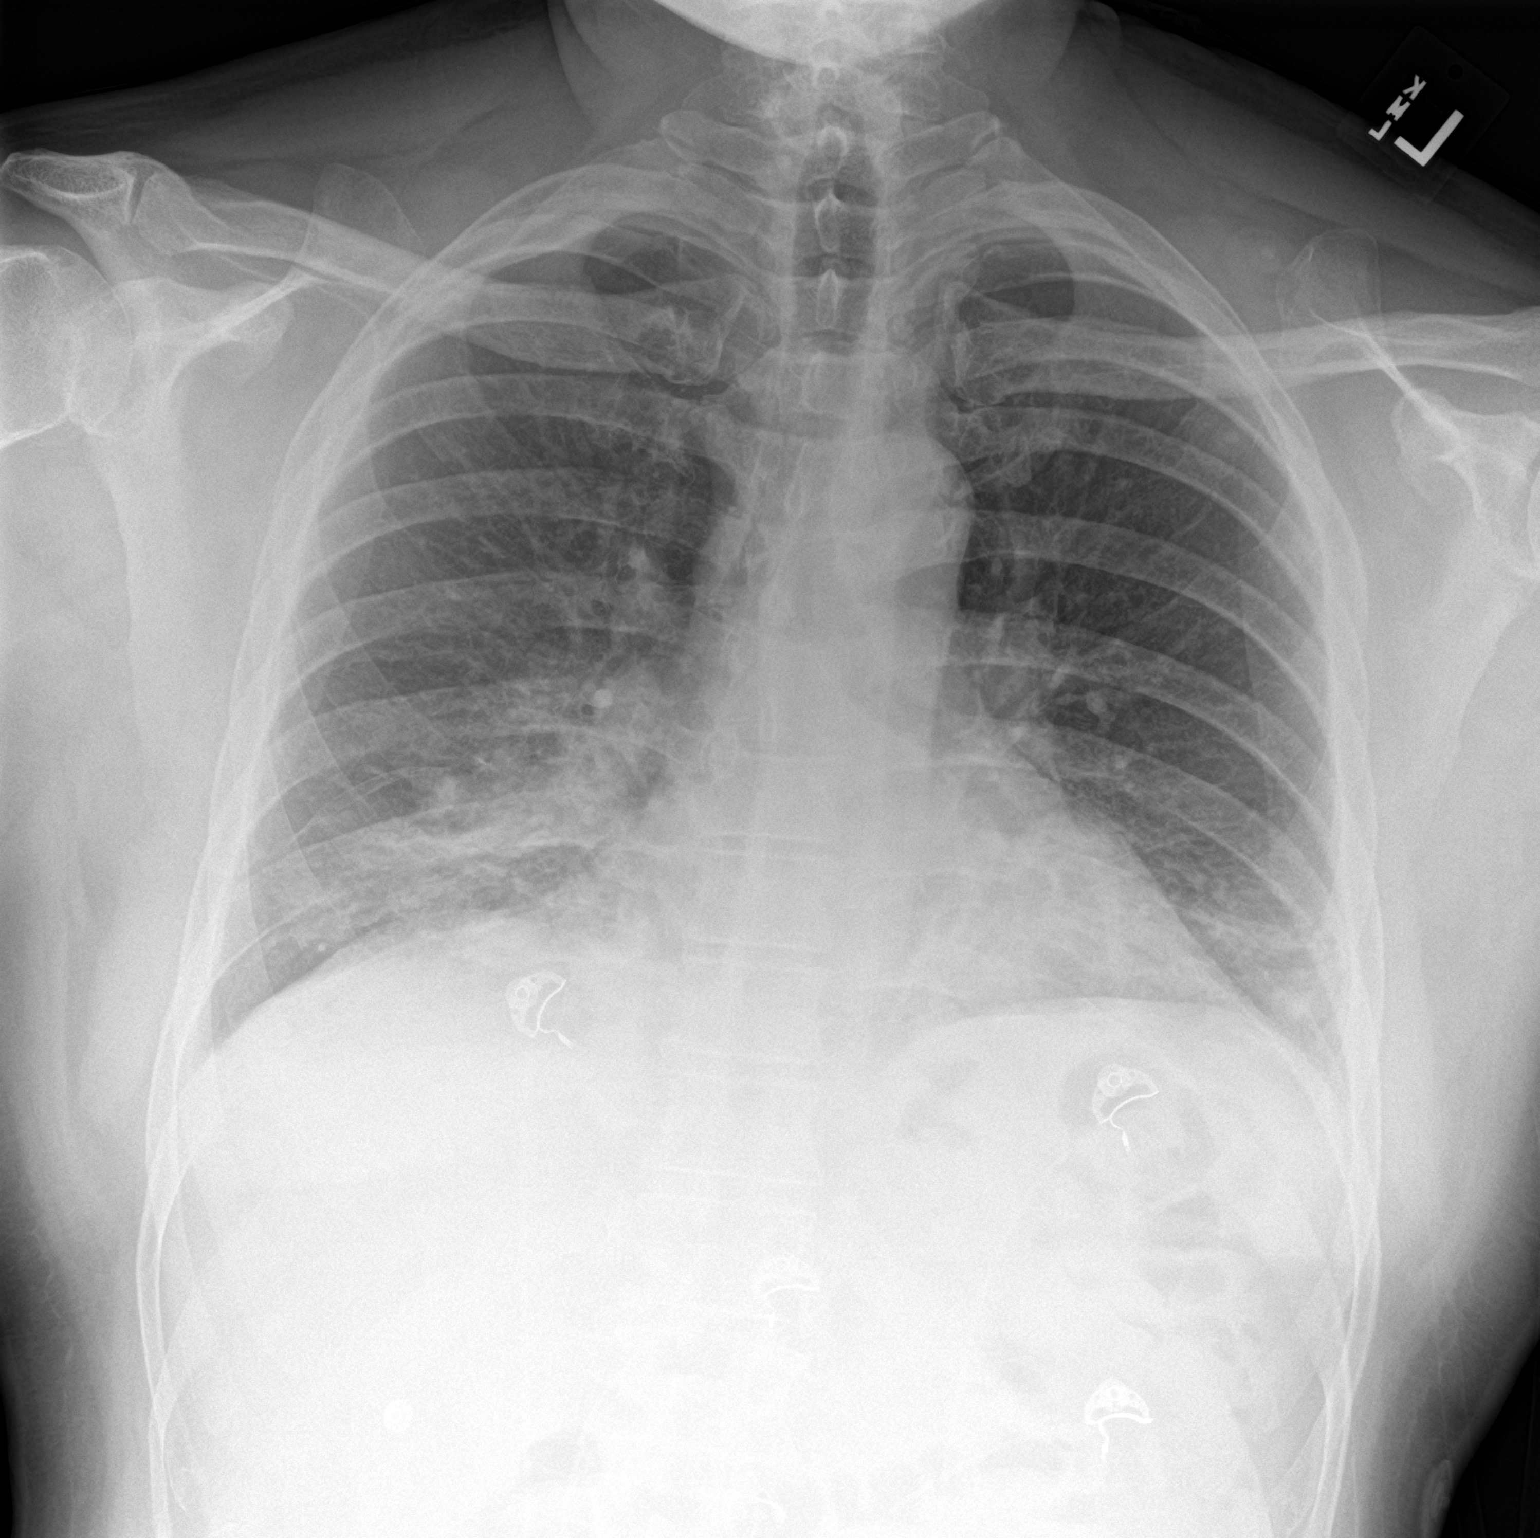

[chest lat]
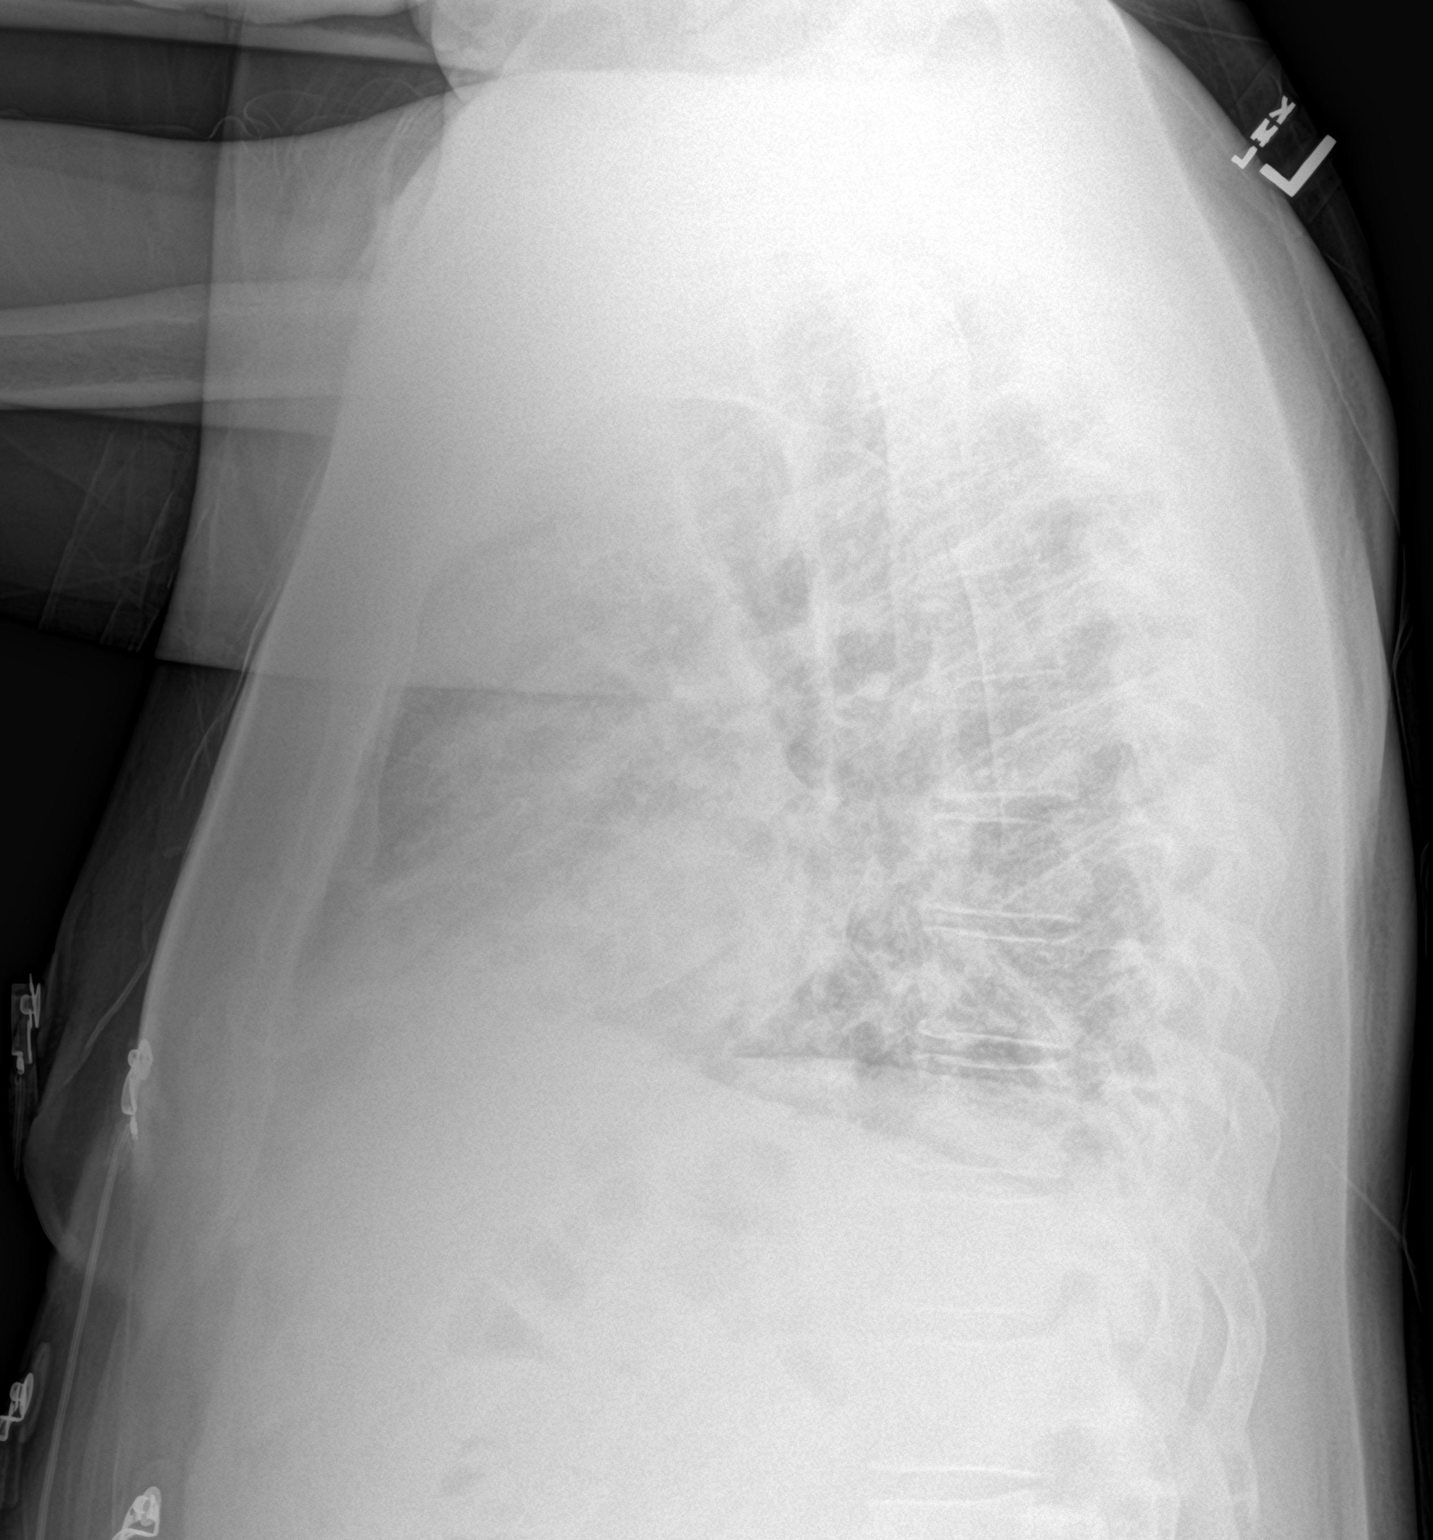

[2 of 2 positions shown; findings below may reference images not displayed]

FINDINGS: The heart size and mediastinal contours are within normal limits. No
pneumothorax or pleural effusion is noted. Increased right basilar
opacity is noted concerning for worsening pneumonia. Stable left
basilar subsegmental atelectasis or infiltrate is noted. The
visualized skeletal structures are unremarkable.
IMPRESSION: Increased right basilar opacity is noted concerning for worsening
pneumonia.

## 2021-05-01 ENCOUNTER — Other Ambulatory Visit (HOSPITAL_BASED_OUTPATIENT_CLINIC_OR_DEPARTMENT_OTHER): Payer: Self-pay

## 2021-05-02 ENCOUNTER — Other Ambulatory Visit (HOSPITAL_BASED_OUTPATIENT_CLINIC_OR_DEPARTMENT_OTHER): Payer: Self-pay

## 2021-05-02 MED ORDER — TAMSULOSIN HCL 0.4 MG PO CAPS
0.8000 mg | ORAL_CAPSULE | Freq: Every day | ORAL | 3 refills | Status: AC
  Filled 2021-05-02 – 2021-12-27 (×2): qty 180, 90d supply, fill #0
  Filled 2022-03-26: qty 180, 90d supply, fill #1

## 2021-05-02 MED ORDER — TERBINAFINE HCL 1 % EX CREA
TOPICAL_CREAM | CUTANEOUS | 1 refills | Status: AC
  Filled 2021-05-02: qty 30, 14d supply, fill #0

## 2021-05-14 ENCOUNTER — Other Ambulatory Visit (HOSPITAL_BASED_OUTPATIENT_CLINIC_OR_DEPARTMENT_OTHER): Payer: Self-pay

## 2021-05-15 ENCOUNTER — Other Ambulatory Visit (HOSPITAL_BASED_OUTPATIENT_CLINIC_OR_DEPARTMENT_OTHER): Payer: Self-pay

## 2021-05-15 MED ORDER — LISINOPRIL-HYDROCHLOROTHIAZIDE 20-12.5 MG PO TABS
1.0000 | ORAL_TABLET | Freq: Two times a day (BID) | ORAL | 3 refills | Status: AC
  Filled 2021-05-15: qty 60, 30d supply, fill #0
  Filled 2021-06-10: qty 60, 30d supply, fill #1
  Filled 2021-07-12: qty 60, 30d supply, fill #2
  Filled 2021-08-13: qty 60, 30d supply, fill #3

## 2021-05-21 ENCOUNTER — Other Ambulatory Visit (HOSPITAL_BASED_OUTPATIENT_CLINIC_OR_DEPARTMENT_OTHER): Payer: Self-pay

## 2021-05-21 MED FILL — Atorvastatin Calcium Tab 80 MG (Base Equivalent): ORAL | 90 days supply | Qty: 90 | Fill #1 | Status: AC

## 2021-05-23 ENCOUNTER — Other Ambulatory Visit (HOSPITAL_COMMUNITY): Payer: Self-pay

## 2021-05-23 ENCOUNTER — Other Ambulatory Visit (HOSPITAL_BASED_OUTPATIENT_CLINIC_OR_DEPARTMENT_OTHER): Payer: Self-pay

## 2021-05-30 ENCOUNTER — Other Ambulatory Visit (HOSPITAL_BASED_OUTPATIENT_CLINIC_OR_DEPARTMENT_OTHER): Payer: Self-pay

## 2021-06-10 ENCOUNTER — Other Ambulatory Visit (HOSPITAL_BASED_OUTPATIENT_CLINIC_OR_DEPARTMENT_OTHER): Payer: Self-pay

## 2021-06-12 ENCOUNTER — Other Ambulatory Visit (HOSPITAL_COMMUNITY): Payer: Self-pay

## 2021-06-17 ENCOUNTER — Other Ambulatory Visit (HOSPITAL_BASED_OUTPATIENT_CLINIC_OR_DEPARTMENT_OTHER): Payer: Self-pay

## 2021-06-25 ENCOUNTER — Other Ambulatory Visit (HOSPITAL_BASED_OUTPATIENT_CLINIC_OR_DEPARTMENT_OTHER): Payer: Self-pay

## 2021-06-26 ENCOUNTER — Other Ambulatory Visit (HOSPITAL_BASED_OUTPATIENT_CLINIC_OR_DEPARTMENT_OTHER): Payer: Self-pay

## 2021-06-27 ENCOUNTER — Other Ambulatory Visit (HOSPITAL_BASED_OUTPATIENT_CLINIC_OR_DEPARTMENT_OTHER): Payer: Self-pay

## 2021-07-12 ENCOUNTER — Other Ambulatory Visit (HOSPITAL_BASED_OUTPATIENT_CLINIC_OR_DEPARTMENT_OTHER): Payer: Self-pay

## 2021-07-19 ENCOUNTER — Other Ambulatory Visit (HOSPITAL_BASED_OUTPATIENT_CLINIC_OR_DEPARTMENT_OTHER): Payer: Self-pay

## 2021-07-29 ENCOUNTER — Other Ambulatory Visit (HOSPITAL_BASED_OUTPATIENT_CLINIC_OR_DEPARTMENT_OTHER): Payer: Self-pay

## 2021-08-02 ENCOUNTER — Other Ambulatory Visit (HOSPITAL_BASED_OUTPATIENT_CLINIC_OR_DEPARTMENT_OTHER): Payer: Self-pay

## 2021-08-13 ENCOUNTER — Other Ambulatory Visit (HOSPITAL_BASED_OUTPATIENT_CLINIC_OR_DEPARTMENT_OTHER): Payer: Self-pay

## 2021-08-29 ENCOUNTER — Other Ambulatory Visit (HOSPITAL_BASED_OUTPATIENT_CLINIC_OR_DEPARTMENT_OTHER): Payer: Self-pay

## 2021-08-30 ENCOUNTER — Other Ambulatory Visit (HOSPITAL_BASED_OUTPATIENT_CLINIC_OR_DEPARTMENT_OTHER): Payer: Self-pay

## 2021-08-30 MED ORDER — LISINOPRIL 20 MG PO TABS
20.0000 mg | ORAL_TABLET | Freq: Every day | ORAL | 3 refills | Status: DC
  Filled 2021-08-30: qty 90, 90d supply, fill #0
  Filled 2021-11-26: qty 90, 90d supply, fill #1
  Filled 2022-02-24: qty 90, 90d supply, fill #2
  Filled 2022-05-23: qty 90, 90d supply, fill #3

## 2021-09-05 ENCOUNTER — Other Ambulatory Visit (HOSPITAL_BASED_OUTPATIENT_CLINIC_OR_DEPARTMENT_OTHER): Payer: Self-pay

## 2021-09-27 ENCOUNTER — Other Ambulatory Visit (HOSPITAL_BASED_OUTPATIENT_CLINIC_OR_DEPARTMENT_OTHER): Payer: Self-pay

## 2021-09-27 MED FILL — Atorvastatin Calcium Tab 80 MG (Base Equivalent): ORAL | 90 days supply | Qty: 90 | Fill #2 | Status: AC

## 2021-10-14 ENCOUNTER — Other Ambulatory Visit (HOSPITAL_BASED_OUTPATIENT_CLINIC_OR_DEPARTMENT_OTHER): Payer: Self-pay

## 2021-10-24 ENCOUNTER — Other Ambulatory Visit (HOSPITAL_BASED_OUTPATIENT_CLINIC_OR_DEPARTMENT_OTHER): Payer: Self-pay

## 2021-10-31 ENCOUNTER — Other Ambulatory Visit (HOSPITAL_BASED_OUTPATIENT_CLINIC_OR_DEPARTMENT_OTHER): Payer: Self-pay

## 2021-11-12 ENCOUNTER — Other Ambulatory Visit (HOSPITAL_BASED_OUTPATIENT_CLINIC_OR_DEPARTMENT_OTHER): Payer: Self-pay

## 2021-11-14 ENCOUNTER — Other Ambulatory Visit (HOSPITAL_BASED_OUTPATIENT_CLINIC_OR_DEPARTMENT_OTHER): Payer: Self-pay

## 2021-11-21 ENCOUNTER — Other Ambulatory Visit (HOSPITAL_BASED_OUTPATIENT_CLINIC_OR_DEPARTMENT_OTHER): Payer: Self-pay

## 2021-11-22 ENCOUNTER — Other Ambulatory Visit (HOSPITAL_BASED_OUTPATIENT_CLINIC_OR_DEPARTMENT_OTHER): Payer: Self-pay

## 2021-11-25 ENCOUNTER — Other Ambulatory Visit (HOSPITAL_COMMUNITY): Payer: Self-pay

## 2021-11-26 ENCOUNTER — Other Ambulatory Visit (HOSPITAL_BASED_OUTPATIENT_CLINIC_OR_DEPARTMENT_OTHER): Payer: Self-pay

## 2021-11-29 ENCOUNTER — Other Ambulatory Visit (HOSPITAL_BASED_OUTPATIENT_CLINIC_OR_DEPARTMENT_OTHER): Payer: Self-pay

## 2021-12-27 ENCOUNTER — Other Ambulatory Visit (HOSPITAL_BASED_OUTPATIENT_CLINIC_OR_DEPARTMENT_OTHER): Payer: Self-pay

## 2021-12-27 MED ORDER — JARDIANCE 25 MG PO TABS
25.0000 mg | ORAL_TABLET | Freq: Every day | ORAL | 1 refills | Status: DC
  Filled 2021-12-27: qty 30, 30d supply, fill #0
  Filled 2022-01-29: qty 30, 30d supply, fill #1
  Filled 2022-02-24: qty 30, 30d supply, fill #2
  Filled 2022-03-26: qty 30, 30d supply, fill #3
  Filled 2022-04-30: qty 30, 30d supply, fill #4
  Filled 2022-05-23: qty 30, 30d supply, fill #5

## 2021-12-27 MED ORDER — JANUMET XR 50-1000 MG PO TB24
1.0000 | ORAL_TABLET | Freq: Two times a day (BID) | ORAL | 1 refills | Status: DC
  Filled 2021-12-27: qty 60, 30d supply, fill #0
  Filled 2022-01-29: qty 60, 30d supply, fill #1
  Filled 2022-02-24: qty 60, 30d supply, fill #2
  Filled 2022-03-26: qty 60, 30d supply, fill #3
  Filled 2022-04-30: qty 60, 30d supply, fill #4
  Filled 2022-05-23: qty 60, 30d supply, fill #5

## 2021-12-27 MED ORDER — CLOPIDOGREL BISULFATE 75 MG PO TABS
75.0000 mg | ORAL_TABLET | Freq: Every day | ORAL | 1 refills | Status: DC
  Filled 2021-12-27: qty 90, 90d supply, fill #0
  Filled 2022-03-26: qty 90, 90d supply, fill #1

## 2021-12-30 ENCOUNTER — Other Ambulatory Visit (HOSPITAL_BASED_OUTPATIENT_CLINIC_OR_DEPARTMENT_OTHER): Payer: Self-pay

## 2021-12-30 MED ORDER — ATORVASTATIN CALCIUM 80 MG PO TABS
80.0000 mg | ORAL_TABLET | Freq: Every day | ORAL | 1 refills | Status: DC
  Filled 2021-12-30 – 2022-01-10 (×2): qty 90, 90d supply, fill #0
  Filled 2022-03-26: qty 90, 90d supply, fill #1

## 2022-01-08 ENCOUNTER — Other Ambulatory Visit (HOSPITAL_BASED_OUTPATIENT_CLINIC_OR_DEPARTMENT_OTHER): Payer: Self-pay

## 2022-01-08 MED ORDER — MODAFINIL 200 MG PO TABS
200.0000 mg | ORAL_TABLET | Freq: Every day | ORAL | 5 refills | Status: AC
  Filled 2022-01-08: qty 30, 30d supply, fill #0

## 2022-01-10 ENCOUNTER — Other Ambulatory Visit (HOSPITAL_BASED_OUTPATIENT_CLINIC_OR_DEPARTMENT_OTHER): Payer: Self-pay

## 2022-01-29 ENCOUNTER — Other Ambulatory Visit (HOSPITAL_BASED_OUTPATIENT_CLINIC_OR_DEPARTMENT_OTHER): Payer: Self-pay

## 2022-02-19 ENCOUNTER — Other Ambulatory Visit (HOSPITAL_BASED_OUTPATIENT_CLINIC_OR_DEPARTMENT_OTHER): Payer: Self-pay

## 2022-02-24 ENCOUNTER — Other Ambulatory Visit (HOSPITAL_BASED_OUTPATIENT_CLINIC_OR_DEPARTMENT_OTHER): Payer: Self-pay

## 2022-02-26 ENCOUNTER — Other Ambulatory Visit (HOSPITAL_BASED_OUTPATIENT_CLINIC_OR_DEPARTMENT_OTHER): Payer: Self-pay

## 2022-02-27 ENCOUNTER — Other Ambulatory Visit (HOSPITAL_BASED_OUTPATIENT_CLINIC_OR_DEPARTMENT_OTHER): Payer: Self-pay

## 2022-02-27 MED ORDER — GLIPIZIDE ER 5 MG PO TB24
5.0000 mg | ORAL_TABLET | Freq: Every day | ORAL | 3 refills | Status: DC
  Filled 2022-02-27: qty 90, 90d supply, fill #0
  Filled 2022-05-23: qty 90, 90d supply, fill #1
  Filled 2022-08-26 (×2): qty 90, 90d supply, fill #2
  Filled 2022-11-28: qty 90, 90d supply, fill #3

## 2022-03-26 ENCOUNTER — Other Ambulatory Visit (HOSPITAL_BASED_OUTPATIENT_CLINIC_OR_DEPARTMENT_OTHER): Payer: Self-pay

## 2022-04-30 ENCOUNTER — Other Ambulatory Visit (HOSPITAL_BASED_OUTPATIENT_CLINIC_OR_DEPARTMENT_OTHER): Payer: Self-pay

## 2022-05-23 ENCOUNTER — Other Ambulatory Visit (HOSPITAL_BASED_OUTPATIENT_CLINIC_OR_DEPARTMENT_OTHER): Payer: Self-pay

## 2022-05-29 ENCOUNTER — Other Ambulatory Visit (HOSPITAL_BASED_OUTPATIENT_CLINIC_OR_DEPARTMENT_OTHER): Payer: Self-pay

## 2022-06-30 ENCOUNTER — Other Ambulatory Visit (HOSPITAL_BASED_OUTPATIENT_CLINIC_OR_DEPARTMENT_OTHER): Payer: Self-pay

## 2022-07-01 ENCOUNTER — Other Ambulatory Visit (HOSPITAL_BASED_OUTPATIENT_CLINIC_OR_DEPARTMENT_OTHER): Payer: Self-pay

## 2022-07-01 MED ORDER — JARDIANCE 25 MG PO TABS
25.0000 mg | ORAL_TABLET | Freq: Every day | ORAL | 1 refills | Status: DC
  Filled 2022-07-01: qty 90, 90d supply, fill #0
  Filled 2022-09-29: qty 90, 90d supply, fill #1

## 2022-07-01 MED ORDER — CLOPIDOGREL BISULFATE 75 MG PO TABS
75.0000 mg | ORAL_TABLET | Freq: Every day | ORAL | 1 refills | Status: DC
  Filled 2022-07-01: qty 90, 90d supply, fill #0
  Filled 2022-09-29: qty 90, 90d supply, fill #1

## 2022-07-01 MED ORDER — JANUMET XR 50-1000 MG PO TB24
1.0000 | ORAL_TABLET | Freq: Two times a day (BID) | ORAL | 1 refills | Status: DC
  Filled 2022-07-01: qty 180, 90d supply, fill #0
  Filled 2022-09-29: qty 180, 90d supply, fill #1

## 2022-07-04 ENCOUNTER — Other Ambulatory Visit (HOSPITAL_BASED_OUTPATIENT_CLINIC_OR_DEPARTMENT_OTHER): Payer: Self-pay

## 2022-07-07 ENCOUNTER — Other Ambulatory Visit (HOSPITAL_BASED_OUTPATIENT_CLINIC_OR_DEPARTMENT_OTHER): Payer: Self-pay

## 2022-07-07 MED ORDER — ATORVASTATIN CALCIUM 80 MG PO TABS
80.0000 mg | ORAL_TABLET | Freq: Every day | ORAL | 1 refills | Status: DC
  Filled 2022-07-07: qty 90, 90d supply, fill #0
  Filled 2022-11-07: qty 90, 90d supply, fill #1

## 2022-07-08 ENCOUNTER — Other Ambulatory Visit (HOSPITAL_BASED_OUTPATIENT_CLINIC_OR_DEPARTMENT_OTHER): Payer: Self-pay

## 2022-07-18 ENCOUNTER — Other Ambulatory Visit (HOSPITAL_BASED_OUTPATIENT_CLINIC_OR_DEPARTMENT_OTHER): Payer: Self-pay

## 2022-07-18 MED ORDER — NEOMYCIN-POLYMYXIN-HC 3.5-10000-1 OT SUSP
OTIC | 1 refills | Status: AC
  Filled 2022-07-18: qty 10, 10d supply, fill #0

## 2022-07-29 ENCOUNTER — Other Ambulatory Visit (HOSPITAL_BASED_OUTPATIENT_CLINIC_OR_DEPARTMENT_OTHER): Payer: Self-pay

## 2022-08-26 ENCOUNTER — Other Ambulatory Visit (HOSPITAL_BASED_OUTPATIENT_CLINIC_OR_DEPARTMENT_OTHER): Payer: Self-pay

## 2022-08-26 ENCOUNTER — Other Ambulatory Visit (HOSPITAL_COMMUNITY): Payer: Self-pay

## 2022-08-27 ENCOUNTER — Other Ambulatory Visit (HOSPITAL_COMMUNITY): Payer: Self-pay

## 2022-08-27 ENCOUNTER — Other Ambulatory Visit (HOSPITAL_BASED_OUTPATIENT_CLINIC_OR_DEPARTMENT_OTHER): Payer: Self-pay

## 2022-08-27 MED ORDER — LISINOPRIL 20 MG PO TABS
20.0000 mg | ORAL_TABLET | Freq: Every day | ORAL | 3 refills | Status: AC
  Filled 2023-08-07: qty 90, 90d supply, fill #0

## 2022-08-27 MED ORDER — LISINOPRIL 20 MG PO TABS
20.0000 mg | ORAL_TABLET | Freq: Every day | ORAL | 3 refills | Status: AC
  Filled 2022-08-27: qty 90, 90d supply, fill #0
  Filled 2022-11-28: qty 90, 90d supply, fill #1
  Filled 2023-02-24: qty 90, 90d supply, fill #2
  Filled 2023-05-25: qty 90, 90d supply, fill #3

## 2022-09-02 ENCOUNTER — Other Ambulatory Visit (HOSPITAL_BASED_OUTPATIENT_CLINIC_OR_DEPARTMENT_OTHER): Payer: Self-pay

## 2022-09-09 ENCOUNTER — Other Ambulatory Visit (HOSPITAL_BASED_OUTPATIENT_CLINIC_OR_DEPARTMENT_OTHER): Payer: Self-pay

## 2022-09-09 MED ORDER — MODAFINIL 200 MG PO TABS
200.0000 mg | ORAL_TABLET | Freq: Every day | ORAL | 5 refills | Status: DC
  Filled 2022-09-09: qty 30, 30d supply, fill #0

## 2022-09-10 ENCOUNTER — Other Ambulatory Visit (HOSPITAL_BASED_OUTPATIENT_CLINIC_OR_DEPARTMENT_OTHER): Payer: Self-pay

## 2022-09-11 ENCOUNTER — Other Ambulatory Visit (HOSPITAL_BASED_OUTPATIENT_CLINIC_OR_DEPARTMENT_OTHER): Payer: Self-pay

## 2022-09-16 ENCOUNTER — Other Ambulatory Visit (HOSPITAL_BASED_OUTPATIENT_CLINIC_OR_DEPARTMENT_OTHER): Payer: Self-pay

## 2022-09-29 ENCOUNTER — Other Ambulatory Visit (HOSPITAL_BASED_OUTPATIENT_CLINIC_OR_DEPARTMENT_OTHER): Payer: Self-pay

## 2022-10-09 ENCOUNTER — Other Ambulatory Visit (HOSPITAL_BASED_OUTPATIENT_CLINIC_OR_DEPARTMENT_OTHER): Payer: Self-pay

## 2022-11-07 ENCOUNTER — Other Ambulatory Visit (HOSPITAL_BASED_OUTPATIENT_CLINIC_OR_DEPARTMENT_OTHER): Payer: Self-pay

## 2022-11-28 ENCOUNTER — Other Ambulatory Visit (HOSPITAL_BASED_OUTPATIENT_CLINIC_OR_DEPARTMENT_OTHER): Payer: Self-pay

## 2022-12-16 ENCOUNTER — Other Ambulatory Visit (HOSPITAL_BASED_OUTPATIENT_CLINIC_OR_DEPARTMENT_OTHER): Payer: Self-pay

## 2022-12-16 MED ORDER — SILDENAFIL CITRATE 50 MG PO TABS
50.0000 mg | ORAL_TABLET | Freq: Every day | ORAL | 3 refills | Status: AC
  Filled 2022-12-16: qty 4, 30d supply, fill #0
  Filled 2022-12-24: qty 10, 10d supply, fill #0

## 2022-12-23 ENCOUNTER — Other Ambulatory Visit (HOSPITAL_BASED_OUTPATIENT_CLINIC_OR_DEPARTMENT_OTHER): Payer: Self-pay

## 2022-12-24 ENCOUNTER — Other Ambulatory Visit (HOSPITAL_BASED_OUTPATIENT_CLINIC_OR_DEPARTMENT_OTHER): Payer: Self-pay

## 2022-12-30 ENCOUNTER — Other Ambulatory Visit (HOSPITAL_BASED_OUTPATIENT_CLINIC_OR_DEPARTMENT_OTHER): Payer: Self-pay

## 2022-12-31 ENCOUNTER — Other Ambulatory Visit: Payer: Self-pay

## 2022-12-31 ENCOUNTER — Other Ambulatory Visit (HOSPITAL_BASED_OUTPATIENT_CLINIC_OR_DEPARTMENT_OTHER): Payer: Self-pay

## 2022-12-31 MED ORDER — CLOPIDOGREL BISULFATE 75 MG PO TABS
75.0000 mg | ORAL_TABLET | Freq: Every day | ORAL | 1 refills | Status: DC
  Filled 2022-12-31: qty 90, 90d supply, fill #0
  Filled 2023-10-19: qty 90, 90d supply, fill #1

## 2022-12-31 MED ORDER — JANUMET XR 50-1000 MG PO TB24
1.0000 | ORAL_TABLET | Freq: Two times a day (BID) | ORAL | 1 refills | Status: AC
  Filled 2022-12-31: qty 180, 90d supply, fill #0

## 2022-12-31 MED ORDER — JARDIANCE 25 MG PO TABS
25.0000 mg | ORAL_TABLET | Freq: Every day | ORAL | 1 refills | Status: DC
  Filled 2022-12-31: qty 90, 90d supply, fill #0
  Filled 2023-03-31: qty 30, 30d supply, fill #1
  Filled 2023-04-29 (×2): qty 30, 30d supply, fill #2
  Filled 2023-05-25: qty 30, 30d supply, fill #3

## 2022-12-31 MED ORDER — ATORVASTATIN CALCIUM 80 MG PO TABS
80.0000 mg | ORAL_TABLET | Freq: Every day | ORAL | 1 refills | Status: AC
  Filled 2022-12-31 – 2023-02-03 (×2): qty 90, 90d supply, fill #0

## 2023-01-13 ENCOUNTER — Other Ambulatory Visit (HOSPITAL_BASED_OUTPATIENT_CLINIC_OR_DEPARTMENT_OTHER): Payer: Self-pay

## 2023-01-13 MED ORDER — TAMSULOSIN HCL 0.4 MG PO CAPS
0.4000 mg | ORAL_CAPSULE | Freq: Every day | ORAL | 1 refills | Status: AC
  Filled 2023-01-13: qty 90, 90d supply, fill #0
  Filled 2023-04-16 – 2023-10-19 (×2): qty 90, 90d supply, fill #1

## 2023-02-03 ENCOUNTER — Other Ambulatory Visit (HOSPITAL_BASED_OUTPATIENT_CLINIC_OR_DEPARTMENT_OTHER): Payer: Self-pay

## 2023-02-24 ENCOUNTER — Other Ambulatory Visit: Payer: Self-pay

## 2023-02-24 ENCOUNTER — Other Ambulatory Visit (HOSPITAL_BASED_OUTPATIENT_CLINIC_OR_DEPARTMENT_OTHER): Payer: Self-pay

## 2023-02-24 MED ORDER — GLIPIZIDE ER 5 MG PO TB24
5.0000 mg | ORAL_TABLET | Freq: Every day | ORAL | 3 refills | Status: DC
  Filled 2023-02-24: qty 90, 90d supply, fill #0
  Filled 2023-05-25: qty 90, 90d supply, fill #1
  Filled 2023-08-28: qty 90, 90d supply, fill #2
  Filled 2023-11-24: qty 90, 90d supply, fill #3

## 2023-03-18 ENCOUNTER — Other Ambulatory Visit: Payer: Self-pay

## 2023-03-18 ENCOUNTER — Other Ambulatory Visit (HOSPITAL_BASED_OUTPATIENT_CLINIC_OR_DEPARTMENT_OTHER): Payer: Self-pay

## 2023-03-18 MED ORDER — CLOPIDOGREL BISULFATE 75 MG PO TABS
75.0000 mg | ORAL_TABLET | Freq: Every day | ORAL | 1 refills | Status: AC
  Filled 2023-03-18: qty 90, 90d supply, fill #0
  Filled 2023-07-20: qty 90, 90d supply, fill #1

## 2023-03-18 MED ORDER — ATORVASTATIN CALCIUM 80 MG PO TABS
80.0000 mg | ORAL_TABLET | Freq: Every day | ORAL | 3 refills | Status: DC
  Filled 2023-04-29: qty 90, 90d supply, fill #0
  Filled 2023-08-07: qty 90, 90d supply, fill #1
  Filled 2023-11-24: qty 90, 90d supply, fill #2
  Filled 2024-02-25: qty 90, 90d supply, fill #3

## 2023-03-18 MED ORDER — LISINOPRIL 20 MG PO TABS
20.0000 mg | ORAL_TABLET | Freq: Every day | ORAL | 3 refills | Status: DC
  Filled 2023-11-24: qty 90, 90d supply, fill #0
  Filled 2024-02-25: qty 90, 90d supply, fill #1

## 2023-03-18 MED ORDER — JANUMET XR 50-1000 MG PO TB24: ORAL_TABLET | ORAL | 3 refills | Status: AC

## 2023-03-18 MED ORDER — JARDIANCE 25 MG PO TABS
25.0000 mg | ORAL_TABLET | Freq: Every day | ORAL | 1 refills | Status: DC
  Filled 2023-03-20: qty 30, 30d supply, fill #0

## 2023-03-18 MED ORDER — SILDENAFIL CITRATE 50 MG PO TABS
50.0000 mg | ORAL_TABLET | Freq: Every day | ORAL | 3 refills | Status: AC | PRN
  Filled 2023-03-18: qty 10, 10d supply, fill #0
  Filled 2024-02-24: qty 10, 10d supply, fill #1
  Filled 2024-03-17 (×2): qty 10, 10d supply, fill #2

## 2023-03-18 MED ORDER — TAMSULOSIN HCL 0.4 MG PO CAPS
0.4000 mg | ORAL_CAPSULE | Freq: Every day | ORAL | 3 refills | Status: DC
  Filled 2023-04-10: qty 90, 90d supply, fill #0
  Filled 2023-07-20: qty 90, 90d supply, fill #1
  Filled 2024-01-14: qty 90, 90d supply, fill #2

## 2023-03-20 ENCOUNTER — Other Ambulatory Visit (HOSPITAL_BASED_OUTPATIENT_CLINIC_OR_DEPARTMENT_OTHER): Payer: Self-pay

## 2023-03-31 ENCOUNTER — Other Ambulatory Visit (HOSPITAL_BASED_OUTPATIENT_CLINIC_OR_DEPARTMENT_OTHER): Payer: Self-pay

## 2023-04-10 ENCOUNTER — Other Ambulatory Visit (HOSPITAL_BASED_OUTPATIENT_CLINIC_OR_DEPARTMENT_OTHER): Payer: Self-pay

## 2023-04-13 ENCOUNTER — Other Ambulatory Visit (HOSPITAL_BASED_OUTPATIENT_CLINIC_OR_DEPARTMENT_OTHER): Payer: Self-pay

## 2023-04-13 MED ORDER — JANUMET XR 50-1000 MG PO TB24
1.0000 | ORAL_TABLET | Freq: Two times a day (BID) | ORAL | 3 refills | Status: DC
  Filled 2023-04-13 (×2): qty 180, 90d supply, fill #0
  Filled 2023-08-07: qty 60, 30d supply, fill #1
  Filled 2023-09-18: qty 180, 90d supply, fill #2
  Filled 2023-12-16: qty 180, 90d supply, fill #3
  Filled 2024-03-15: qty 120, 60d supply, fill #4

## 2023-04-16 ENCOUNTER — Other Ambulatory Visit (HOSPITAL_BASED_OUTPATIENT_CLINIC_OR_DEPARTMENT_OTHER): Payer: Self-pay

## 2023-04-29 ENCOUNTER — Other Ambulatory Visit (HOSPITAL_BASED_OUTPATIENT_CLINIC_OR_DEPARTMENT_OTHER): Payer: Self-pay

## 2023-04-30 ENCOUNTER — Other Ambulatory Visit (HOSPITAL_BASED_OUTPATIENT_CLINIC_OR_DEPARTMENT_OTHER): Payer: Self-pay

## 2023-05-25 ENCOUNTER — Other Ambulatory Visit (HOSPITAL_BASED_OUTPATIENT_CLINIC_OR_DEPARTMENT_OTHER): Payer: Self-pay

## 2023-05-25 ENCOUNTER — Other Ambulatory Visit: Payer: Self-pay

## 2023-06-15 ENCOUNTER — Other Ambulatory Visit (HOSPITAL_BASED_OUTPATIENT_CLINIC_OR_DEPARTMENT_OTHER): Payer: Self-pay

## 2023-06-15 MED ORDER — MODAFINIL 200 MG PO TABS: 200.0000 mg | ORAL_TABLET | Freq: Every day | ORAL | 5 refills | Status: AC

## 2023-06-15 MED ORDER — DONEPEZIL HCL 5 MG PO TABS
5.0000 mg | ORAL_TABLET | Freq: Every morning | ORAL | 11 refills | Status: DC
  Filled 2023-06-15: qty 30, 30d supply, fill #0

## 2023-06-26 ENCOUNTER — Other Ambulatory Visit (HOSPITAL_BASED_OUTPATIENT_CLINIC_OR_DEPARTMENT_OTHER): Payer: Self-pay

## 2023-06-26 MED ORDER — JARDIANCE 25 MG PO TABS
25.0000 mg | ORAL_TABLET | Freq: Every day | ORAL | 3 refills | Status: DC
  Filled 2023-06-26 – 2023-06-29 (×2): qty 30, 30d supply, fill #0
  Filled 2023-07-29: qty 30, 30d supply, fill #1
  Filled 2023-08-28: qty 30, 30d supply, fill #2
  Filled 2023-09-18 – 2023-09-22 (×2): qty 90, 90d supply, fill #3
  Filled 2023-12-16: qty 90, 90d supply, fill #4
  Filled 2024-03-15: qty 90, 90d supply, fill #5

## 2023-06-29 ENCOUNTER — Other Ambulatory Visit (HOSPITAL_BASED_OUTPATIENT_CLINIC_OR_DEPARTMENT_OTHER): Payer: Self-pay

## 2023-07-20 ENCOUNTER — Other Ambulatory Visit (HOSPITAL_BASED_OUTPATIENT_CLINIC_OR_DEPARTMENT_OTHER): Payer: Self-pay

## 2023-07-29 ENCOUNTER — Other Ambulatory Visit (HOSPITAL_BASED_OUTPATIENT_CLINIC_OR_DEPARTMENT_OTHER): Payer: Self-pay

## 2023-08-04 ENCOUNTER — Other Ambulatory Visit (HOSPITAL_BASED_OUTPATIENT_CLINIC_OR_DEPARTMENT_OTHER): Payer: Self-pay

## 2023-08-07 ENCOUNTER — Other Ambulatory Visit (HOSPITAL_BASED_OUTPATIENT_CLINIC_OR_DEPARTMENT_OTHER): Payer: Self-pay

## 2023-08-11 ENCOUNTER — Other Ambulatory Visit (HOSPITAL_COMMUNITY): Payer: Self-pay

## 2023-08-28 ENCOUNTER — Other Ambulatory Visit (HOSPITAL_BASED_OUTPATIENT_CLINIC_OR_DEPARTMENT_OTHER): Payer: Self-pay

## 2023-09-18 ENCOUNTER — Other Ambulatory Visit (HOSPITAL_BASED_OUTPATIENT_CLINIC_OR_DEPARTMENT_OTHER): Payer: Self-pay

## 2023-09-22 ENCOUNTER — Other Ambulatory Visit (HOSPITAL_BASED_OUTPATIENT_CLINIC_OR_DEPARTMENT_OTHER): Payer: Self-pay

## 2023-09-22 ENCOUNTER — Other Ambulatory Visit: Payer: Self-pay

## 2023-10-19 ENCOUNTER — Other Ambulatory Visit: Payer: Self-pay

## 2023-10-19 ENCOUNTER — Other Ambulatory Visit (HOSPITAL_BASED_OUTPATIENT_CLINIC_OR_DEPARTMENT_OTHER): Payer: Self-pay

## 2023-10-29 ENCOUNTER — Other Ambulatory Visit (HOSPITAL_BASED_OUTPATIENT_CLINIC_OR_DEPARTMENT_OTHER): Payer: Self-pay

## 2023-10-29 MED ORDER — NEOMYCIN-POLYMYXIN-HC 1 % OT SOLN
4.0000 [drp] | Freq: Two times a day (BID) | OTIC | 0 refills | Status: AC
  Filled 2023-10-29: qty 10, 7d supply, fill #0

## 2023-11-24 ENCOUNTER — Other Ambulatory Visit (HOSPITAL_BASED_OUTPATIENT_CLINIC_OR_DEPARTMENT_OTHER): Payer: Self-pay

## 2023-12-16 ENCOUNTER — Other Ambulatory Visit (HOSPITAL_BASED_OUTPATIENT_CLINIC_OR_DEPARTMENT_OTHER): Payer: Self-pay

## 2024-01-14 ENCOUNTER — Other Ambulatory Visit (HOSPITAL_BASED_OUTPATIENT_CLINIC_OR_DEPARTMENT_OTHER): Payer: Self-pay

## 2024-01-20 ENCOUNTER — Other Ambulatory Visit (HOSPITAL_BASED_OUTPATIENT_CLINIC_OR_DEPARTMENT_OTHER): Payer: Self-pay

## 2024-01-20 ENCOUNTER — Other Ambulatory Visit: Payer: Self-pay

## 2024-01-20 ENCOUNTER — Other Ambulatory Visit (HOSPITAL_COMMUNITY): Payer: Self-pay

## 2024-01-20 MED ORDER — GLIPIZIDE ER 5 MG PO TB24
5.0000 mg | ORAL_TABLET | Freq: Every day | ORAL | 3 refills | Status: AC
  Filled 2024-01-20 – 2024-02-24 (×2): qty 90, 90d supply, fill #0
  Filled 2024-05-13 (×2): qty 90, 90d supply, fill #1
  Filled 2024-08-15: qty 90, 90d supply, fill #2

## 2024-01-20 MED ORDER — CLOPIDOGREL BISULFATE 75 MG PO TABS
75.0000 mg | ORAL_TABLET | Freq: Every day | ORAL | 1 refills | Status: DC
  Filled 2024-01-20: qty 90, 90d supply, fill #0
  Filled 2024-04-20: qty 90, 90d supply, fill #1

## 2024-02-12 ENCOUNTER — Other Ambulatory Visit (HOSPITAL_BASED_OUTPATIENT_CLINIC_OR_DEPARTMENT_OTHER): Payer: Self-pay

## 2024-02-12 MED ORDER — GALANTAMINE HYDROBROMIDE ER 8 MG PO CP24
8.0000 mg | ORAL_CAPSULE | Freq: Every day | ORAL | 11 refills | Status: AC
  Filled 2024-02-12 (×2): qty 30, 30d supply, fill #0
  Filled 2024-03-17: qty 30, 30d supply, fill #1
  Filled 2024-04-20: qty 30, 30d supply, fill #2
  Filled 2024-05-13: qty 30, 30d supply, fill #3
  Filled 2024-06-23: qty 30, 30d supply, fill #4
  Filled 2024-07-20: qty 30, 30d supply, fill #5
  Filled 2024-08-25 – 2024-08-29 (×2): qty 30, 30d supply, fill #6
  Filled ????-??-??: fill #0

## 2024-02-15 ENCOUNTER — Other Ambulatory Visit (HOSPITAL_BASED_OUTPATIENT_CLINIC_OR_DEPARTMENT_OTHER): Payer: Self-pay

## 2024-02-24 ENCOUNTER — Other Ambulatory Visit: Payer: Self-pay

## 2024-02-24 ENCOUNTER — Other Ambulatory Visit (HOSPITAL_BASED_OUTPATIENT_CLINIC_OR_DEPARTMENT_OTHER): Payer: Self-pay

## 2024-03-08 ENCOUNTER — Other Ambulatory Visit (HOSPITAL_BASED_OUTPATIENT_CLINIC_OR_DEPARTMENT_OTHER): Payer: Self-pay

## 2024-03-11 ENCOUNTER — Other Ambulatory Visit (HOSPITAL_BASED_OUTPATIENT_CLINIC_OR_DEPARTMENT_OTHER): Payer: Self-pay

## 2024-03-15 ENCOUNTER — Other Ambulatory Visit (HOSPITAL_BASED_OUTPATIENT_CLINIC_OR_DEPARTMENT_OTHER): Payer: Self-pay

## 2024-03-17 ENCOUNTER — Other Ambulatory Visit (HOSPITAL_BASED_OUTPATIENT_CLINIC_OR_DEPARTMENT_OTHER): Payer: Self-pay

## 2024-03-21 ENCOUNTER — Other Ambulatory Visit (HOSPITAL_BASED_OUTPATIENT_CLINIC_OR_DEPARTMENT_OTHER): Payer: Self-pay

## 2024-04-20 ENCOUNTER — Other Ambulatory Visit (HOSPITAL_BASED_OUTPATIENT_CLINIC_OR_DEPARTMENT_OTHER): Payer: Self-pay

## 2024-04-21 ENCOUNTER — Other Ambulatory Visit (HOSPITAL_BASED_OUTPATIENT_CLINIC_OR_DEPARTMENT_OTHER): Payer: Self-pay

## 2024-04-21 MED ORDER — TAMSULOSIN HCL 0.4 MG PO CAPS
0.4000 mg | ORAL_CAPSULE | Freq: Every day | ORAL | 3 refills | Status: AC
  Filled 2024-04-21: qty 90, 90d supply, fill #0
  Filled 2024-08-15: qty 90, 90d supply, fill #1

## 2024-04-22 ENCOUNTER — Other Ambulatory Visit (HOSPITAL_BASED_OUTPATIENT_CLINIC_OR_DEPARTMENT_OTHER): Payer: Self-pay

## 2024-05-13 ENCOUNTER — Other Ambulatory Visit (HOSPITAL_COMMUNITY): Payer: Self-pay

## 2024-05-13 ENCOUNTER — Other Ambulatory Visit (HOSPITAL_BASED_OUTPATIENT_CLINIC_OR_DEPARTMENT_OTHER): Payer: Self-pay

## 2024-05-13 ENCOUNTER — Other Ambulatory Visit: Payer: Self-pay

## 2024-05-13 MED ORDER — EMPAGLIFLOZIN 25 MG PO TABS
25.0000 mg | ORAL_TABLET | Freq: Every day | ORAL | 3 refills | Status: AC
  Filled 2024-05-13 – 2024-09-20 (×3): qty 90, 90d supply, fill #0
  Filled 2024-09-20: qty 90, 90d supply, fill #1

## 2024-05-13 MED ORDER — LISINOPRIL 20 MG PO TABS
20.0000 mg | ORAL_TABLET | Freq: Every day | ORAL | 3 refills | Status: AC
  Filled 2024-05-13: qty 90, 90d supply, fill #0
  Filled 2024-08-15: qty 90, 90d supply, fill #1

## 2024-05-13 MED ORDER — CLOPIDOGREL BISULFATE 75 MG PO TABS
75.0000 mg | ORAL_TABLET | Freq: Every day | ORAL | 1 refills | Status: AC
  Filled 2024-05-13 – 2024-07-20 (×3): qty 90, 90d supply, fill #0

## 2024-05-13 MED ORDER — SITAGLIP PHOS-METFORMIN HCL ER 50-1000 MG PO TB24
1.0000 | ORAL_TABLET | Freq: Two times a day (BID) | ORAL | 3 refills | Status: AC
  Filled 2024-05-13: qty 180, 90d supply, fill #0
  Filled 2024-08-15: qty 180, 90d supply, fill #1

## 2024-05-13 MED ORDER — ATORVASTATIN CALCIUM 80 MG PO TABS
80.0000 mg | ORAL_TABLET | Freq: Every day | ORAL | 3 refills | Status: AC
  Filled 2024-05-13: qty 90, 90d supply, fill #0
  Filled 2024-08-15: qty 90, 90d supply, fill #1

## 2024-05-17 ENCOUNTER — Other Ambulatory Visit (HOSPITAL_BASED_OUTPATIENT_CLINIC_OR_DEPARTMENT_OTHER): Payer: Self-pay

## 2024-06-23 ENCOUNTER — Other Ambulatory Visit (HOSPITAL_BASED_OUTPATIENT_CLINIC_OR_DEPARTMENT_OTHER): Payer: Self-pay

## 2024-06-23 ENCOUNTER — Other Ambulatory Visit: Payer: Self-pay

## 2024-06-24 ENCOUNTER — Other Ambulatory Visit (HOSPITAL_BASED_OUTPATIENT_CLINIC_OR_DEPARTMENT_OTHER): Payer: Self-pay

## 2024-07-20 ENCOUNTER — Other Ambulatory Visit (HOSPITAL_BASED_OUTPATIENT_CLINIC_OR_DEPARTMENT_OTHER): Payer: Self-pay

## 2024-07-20 ENCOUNTER — Other Ambulatory Visit: Payer: Self-pay

## 2024-07-21 ENCOUNTER — Other Ambulatory Visit: Payer: Self-pay

## 2024-07-25 ENCOUNTER — Other Ambulatory Visit (HOSPITAL_BASED_OUTPATIENT_CLINIC_OR_DEPARTMENT_OTHER): Payer: Self-pay

## 2024-07-25 MED ORDER — NEOMYCIN-POLYMYXIN-HC 1 % OT SOLN
4.0000 [drp] | Freq: Two times a day (BID) | OTIC | 5 refills | Status: AC | PRN
  Filled 2024-07-25: qty 10, 25d supply, fill #0

## 2024-08-15 ENCOUNTER — Other Ambulatory Visit (HOSPITAL_BASED_OUTPATIENT_CLINIC_OR_DEPARTMENT_OTHER): Payer: Self-pay

## 2024-08-25 ENCOUNTER — Other Ambulatory Visit: Payer: Self-pay

## 2024-08-25 ENCOUNTER — Other Ambulatory Visit (HOSPITAL_COMMUNITY): Payer: Self-pay

## 2024-08-26 ENCOUNTER — Other Ambulatory Visit: Payer: Self-pay

## 2024-08-29 ENCOUNTER — Other Ambulatory Visit (HOSPITAL_BASED_OUTPATIENT_CLINIC_OR_DEPARTMENT_OTHER): Payer: Self-pay

## 2024-08-30 ENCOUNTER — Other Ambulatory Visit (HOSPITAL_BASED_OUTPATIENT_CLINIC_OR_DEPARTMENT_OTHER): Payer: Self-pay

## 2024-08-30 ENCOUNTER — Other Ambulatory Visit: Payer: Self-pay

## 2024-09-14 ENCOUNTER — Other Ambulatory Visit (HOSPITAL_BASED_OUTPATIENT_CLINIC_OR_DEPARTMENT_OTHER): Payer: Self-pay

## 2024-09-14 MED ORDER — GALANTAMINE HYDROBROMIDE ER 8 MG PO CP24
8.0000 mg | ORAL_CAPSULE | Freq: Every day | ORAL | 4 refills | Status: AC
  Filled 2024-09-20 (×2): qty 90, 90d supply, fill #0
  Filled 2024-09-21: qty 30, 30d supply, fill #0
  Filled ????-??-??: fill #0

## 2024-09-20 ENCOUNTER — Other Ambulatory Visit: Payer: Self-pay

## 2024-09-20 ENCOUNTER — Other Ambulatory Visit (HOSPITAL_COMMUNITY): Payer: Self-pay

## 2024-09-20 ENCOUNTER — Other Ambulatory Visit (HOSPITAL_BASED_OUTPATIENT_CLINIC_OR_DEPARTMENT_OTHER): Payer: Self-pay

## 2024-09-21 ENCOUNTER — Other Ambulatory Visit: Payer: Self-pay

## 2024-09-21 ENCOUNTER — Other Ambulatory Visit (HOSPITAL_COMMUNITY): Payer: Self-pay

## 2024-09-22 ENCOUNTER — Other Ambulatory Visit: Payer: Self-pay

## 2024-09-23 ENCOUNTER — Other Ambulatory Visit (HOSPITAL_BASED_OUTPATIENT_CLINIC_OR_DEPARTMENT_OTHER): Payer: Self-pay
